# Patient Record
Sex: Female | Born: 1970 | Race: White | Hispanic: No | Marital: Married | Smoking: Never smoker
Health system: Southern US, Community
[De-identification: ages and names within clinical notes are randomized; demographics above are authoritative.]

## PROBLEM LIST (undated history)

## (undated) DIAGNOSIS — T8859XA Other complications of anesthesia, initial encounter: Secondary | ICD-10-CM

## (undated) DIAGNOSIS — F419 Anxiety disorder, unspecified: Secondary | ICD-10-CM

## (undated) DIAGNOSIS — M1990 Unspecified osteoarthritis, unspecified site: Secondary | ICD-10-CM

## (undated) HISTORY — PX: SHOULDER ARTHROSCOPY: SHX128

## (undated) HISTORY — PX: ERCP: SHX60

## (undated) HISTORY — PX: TUBAL LIGATION: SHX77

---

## 2005-06-26 ENCOUNTER — Ambulatory Visit (HOSPITAL_COMMUNITY): Admission: RE | Admit: 2005-06-26 | Discharge: 2005-06-26 | Payer: Self-pay | Admitting: Obstetrics and Gynecology

## 2006-06-07 ENCOUNTER — Inpatient Hospital Stay (HOSPITAL_COMMUNITY): Admission: RE | Admit: 2006-06-07 | Discharge: 2006-06-10 | Payer: Self-pay | Admitting: Obstetrics and Gynecology

## 2008-03-29 ENCOUNTER — Inpatient Hospital Stay (HOSPITAL_COMMUNITY): Admission: RE | Admit: 2008-03-29 | Discharge: 2008-03-31 | Payer: Self-pay | Admitting: Obstetrics and Gynecology

## 2008-03-29 ENCOUNTER — Encounter (INDEPENDENT_AMBULATORY_CARE_PROVIDER_SITE_OTHER): Payer: Self-pay | Admitting: Obstetrics and Gynecology

## 2010-05-05 ENCOUNTER — Encounter
Admission: RE | Admit: 2010-05-05 | Discharge: 2010-05-05 | Payer: Self-pay | Source: Home / Self Care | Admitting: Obstetrics and Gynecology

## 2010-06-25 ENCOUNTER — Other Ambulatory Visit
Admission: RE | Admit: 2010-06-25 | Discharge: 2010-06-25 | Payer: Self-pay | Source: Home / Self Care | Admitting: Family Medicine

## 2010-12-16 NOTE — H&P (Signed)
Kimberly Kelly, Kimberly Kelly                 ACCOUNT NO.:  [id number]   MEDICAL RECORD NO.:  [id number]          PATIENT TYPE:  INP   LOCATION:  9101                          [facility]ILITY:  [facility]   PHYSICIAN:  Lenoard Aden, M.D.DATE OF BIRTH:  1971/04/17   DATE OF ADMISSION:  03/29/2008  DATE OF DISCHARGE:                              HISTORY & PHYSICAL   CHIEF COMPLAINT:  Elective repeat C-section at 39 weeks.   She is a 40 year old white female G3, P1 who presents for a repeat C-  section at 39 weeks.  She is a nonsmoker, nondrinker.  She denies  domestic or physical violence.  Previous C-section for active phase  arrest.  The patient desires repeat and tubal ligation.   ALLERGIES:  No known drug allergies.   FAMILY HISTORY:  Hypertension, lung cancer, MI, congestive heart  failure, leukemia, breast cancer, and Down syndrome.   PHYSICAL EXAMINATION:  GENERAL:  She is a well-developed, well-  nourished, white female in no acute distress.  HEENT:  Normal.  LUNGS:  Clear.  HEART:  Regular rate and rhythm.  ABDOMEN:  Soft, gravid, and nontender.  Estimated fetal weight is 8-1/2  to 9 pounds.  Cervix is closed, 70% effaced, -2.  EXTREMITIES:  There are no cords.  NEUROLOGIC:  Nonfocal.  SKIN:  Intact.   IMPRESSION:  A 39-week intrauterine pregnancy for repeat C-section and  tubal ligation.  Risks and benefits discussed.  Risks of anesthesia,  infection, bleeding, and injury to abdominal organs with need for repair  noted.  The patient acknowledges and wishes to proceed.      Lenoard Aden, M.D.  Electronically Signed     RJT/MEDQ  D:  03/29/2008  T:  03/30/2008  Job:  [id number]

## 2010-12-16 NOTE — Op Note (Signed)
Kimberly Kelly, Kimberly Kelly                 ACCOUNT NO.:  [id number]   MEDICAL RECORD NO.:  [id number]          PATIENT TYPE:  INP   LOCATION:  9101                          [facility]   PHYSICIAN:  Lenoard Aden, M.D.DATE OF BIRTH:  Mar 19, 1971   DATE OF PROCEDURE:  03/29/2008  DATE OF DISCHARGE:                               OPERATIVE REPORT   PREOPERATIVE DIAGNOSES:  1. A 39-week intrauterine pregnancy.  2. Previous cesarean section, repeat.  3. Desire for elective sterilization.   POSTOPERATIVE DIAGNOSES:  1. A 39-week intrauterine pregnancy.  2. Previous cesarean section, repeat.  3. Desire for elective sterilization.   PROCEDURE:  Repeat cesarean section and tubal ligation.   SURGEON:  Lenoard Aden, MD   ANESTHESIA:  Spinal by Malen Gauze.   ESTIMATED BLOOD LOSS:  1000 mL.   COMPLICATIONS:  None.   DRAINS:  Foley.   COUNTS:  Correct.   CONDITION:  The patient recovered in good condition.   FINDINGS:  Normal tubes, normal ovary, normal uterus, full-term living  female fetus, occiput anterior position, Apgars 8 and 9, pediatricians in  attendance, and cord blood collection done.   BRIEF OPERATIVE NOTE:  After being apprised of risks of anesthesia,  infection, bleeding, injury to abdominal organs, need for repair, the  labor's immediate complications to include bowel and bladder injury,  failure risk of tubal ligation by 10:1000.  The patient was brought to  the operating room where she was administered a spinal anesthetic  without complications, prepped and draped in a sterile fashion.  Foley  catheter was placed after achieving adequate anesthesia, dilute Marcaine  solution was placed.  A Pfannenstiel skin incision was made with  scalpel, carried down to fascia, was nicked in the midline, opened  transversely with Mayo scissors.  Rectus muscles dissected sharply and  midline peritoneum entered sharply.  Bladder blade was placed.  Visceral  peritoneum scored sharply  off the lower segment.  Kerr hysterotomy  incision was made.  Atraumatic delivery of full-term living female after  amniotomy, clear fluid, handed to pediatricians in attendance, Apgars 8  and 9, and cord blood collected by cord blood collection team.  Placenta  delivered manually intact, three-vessel cord.  Uterus exteriorized and  curetted using a dry lap pack and closed in 2 running imbricating layers  of a 0 Monocryl suture.  At this time, right tube was traced out to  fimbriated end and ampullary isthmic portion tube was identified.  Avascular portion of the mesosalpinx was cauterized creating a window.  Proximal plain ties were placed proximally and distally, and a portion  of the right tube was excised.  Lumens were visualized and cauterized.  The same procedure was done on the right tube, it was also done on the  left tube, and tubal segments were sent to pathology.  At this time,  good hemostasis was achieved.  Uterus was replaced in the abdominal  cavity.  Irrigation was performed.  At this time, bladder flap was  inspected and found to be hemostatic.  The lower muscular fascial  attachments were reapproximated using  a 2-0 chromic fascia, closed using  a 0 Monocryl in a continuous running fashion.  Subcutaneous tissue  reapproximated using a 0 plain.  Skin was closed using the INSORB  absorbable stapler device in a standard fashion.  Steri-Strips were  placed.  The patient tolerated the procedure well and was transferred to  recovery room in good condition.      Lenoard Aden, M.D.  Electronically Signed     RJT/MEDQ  D:  03/29/2008  T:  03/30/2008  Job:  [id number]

## 2010-12-19 NOTE — Op Note (Signed)
NAMEBREAHNA, BOYLEN                 ACCOUNT NO.:  [id number]   MEDICAL RECORD NO.:  [id number]          PATIENT TYPE:  INP   LOCATION:  9122                          [facility]   PHYSICIAN:  Lenoard Aden, M.D.DATE OF BIRTH:  1971/07/27   DATE OF PROCEDURE:  06/07/2006  DATE OF DISCHARGE:                                 OPERATIVE REPORT   PREOPERATIVE DIAGNOSIS:  Active phase arrest, maternal fever.  Active phase  arrest 41 weeks.   POSTOPERATIVE DIAGNOSIS:  Active phase arrest, maternal fever.  Active phase  arrest 41 weeks.  OP.  Nuchal cord.  True knot in the cord.   OPERATION PERFORMED:  Primary low segment transverse cesarean section.   SURGEON:  Lenoard Aden, M.D.   ASSISTANT:  Chester Holstein. Earlene Plater, M.D.   ESTIMATED BLOOD LOSS:  1000 mL.   ANESTHESIA:   COMPLICATIONS:  None.   COUNTS:  Correct.   Patient to recovery in good condition.   FINDINGS:  Full term living female, Apgars 8 and 9, occiput posterior  position.  Placenta remained intact.  Three vessel cord noted.   INDICATIONS FOR PROCEDURE:   DESCRIPTION OF PROCEDURE:  Being apprised of the risks of anesthesia,  infection, bleeding, injury to abdominal organs, need for repair delayed  versus immediate complications including bowel and bladder injury, patient  brought to the operating room where she was administered dosing of epidural  anesthetic without complication, prepped and draped in the usual sterile  fashion, Foley catheter previously placed.  After achieving adequate  anesthesia, Pfannenstiel skin incision made with a scalpel, carried down to  the fascia which was nicked in the midline transverse using Mayo scissors.  Rectus muscles dissected sharply in the midline.  Peritoneum entered  sharply, bladder blade placed, visceral peritoneum scored in a smiley  fashion, dissected sharply off the lower uterine segment.  Curved  hysterotomy incision made.  Atraumatic delivery from an occiput  posterior  position.  Nuchal cord x1 reduced.  Full term living female.  Apgars 8 and  9.  Peds in attendance.  Cord blood collected.  True knot noted in the cord.  Placenta was manually intact.  Three vessel cord noted.  Uterus  exteriorized.  Curetted using a dry lap pack and closed in two running  imbricating layers of 0 Monocryl suture.  Irrigation was accomplished.  Bladder was  inspected and found to be hemostatic.  Normal tubes, normal ovaries noted.  Irrigation once again accomplished.  Bladder flap was inspected.  Fascia  closed using 0 Monocryl suture in continuous running fashion, skin closed  with skin staples.  The patient tolerated the procedure well, transferred to  recovery in good condition.      Lenoard Aden, M.D.  Electronically Signed     RJT/MEDQ  D:  06/07/2006  T:  06/08/2006  Job:  [id number]

## 2010-12-19 NOTE — H&P (Signed)
NAMELAKEIA, Kimberly Kelly                 ACCOUNT NO.:  [id number]   MEDICAL RECORD NO.:  [id number]          PATIENT TYPE:  INP   LOCATION:  9168                          [facility]   PHYSICIAN:  Lenoard Aden, M.D.DATE OF BIRTH:  [birth year]   DATE OF ADMISSION:  06/07/2006  DATE OF DISCHARGE:                                HISTORY & PHYSICAL   CHIEF COMPLAINT:  She is a 40 year old white female, G1, P0 at [redacted] weeks  gestation for induction.   ALLERGIES:  She has no known drug allergies.   MEDICATIONS:  Prenatal vitamins.   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   HABITS:  She is a nonsmoker, nondrinker. She denies domestic or physical  violence.   FAMILY HISTORY:  She has a family history of diabetes, myocardial  infarction, CHF, hypertension, lung cancer, leukemia, breast cancer, Down's  syndrome, and retinitis pigmentosa. Her husband is congenitally blind.   OBSTETRICS/GYNECOLOGY:  She has had a history of one spontaneous abortion.  Pregnancy course has been complicated by size date discrepancy with  appropriate for gestational age fetus noted on ultrasound. Otherwise,  uncomplicated prenatal course.   PHYSICAL EXAMINATION:  GENERAL:  She is a well-developed, well-nourished  white female in no acute distress.  HEENT:  Normal.  LUNGS:  Clear.  HEART:  Regular rhythm.  ABDOMEN:  Soft, gravida, nontender.  GENITOPELVIC:  Cervix is 2-3 cm, 60% vertex, -1.  EXTREMITIES:  Reveal __________ .  NEUROLOGIC:  Nonfocal.   IMPRESSION:  Post date intrauterine pregnancy for induction.   PLAN:  To proceed with induction. Risks and benefits of induction versus  expected management discussed. The patient acknowledges and wishes to  proceed.     Lenoard Aden, M.D.  Electronically Signed    RJT/MEDQ  D:  06/07/2006  T:  06/07/2006  Job:  [id number]

## 2010-12-19 NOTE — Discharge Summary (Signed)
Kimberly Kelly, Kimberly Kelly                 ACCOUNT NO.:  [id number]   MEDICAL RECORD NO.:  [id number]          PATIENT TYPE:  INP   LOCATION:  9122                          [facility]   PHYSICIAN:  Lenoard Aden, M.D.DATE OF BIRTH:  05-13-1971   DATE OF ADMISSION:  06/07/2006  DATE OF DISCHARGE:  06/10/2006                               DISCHARGE SUMMARY   Admitted for labor and had a C-section for active phase arrest.  Postoperative course uncomplicated.  Tolerated a regular diet well.  Ambulated without difficulty.  Discharged home day #3.  Discharge  teaching done.   DISCHARGE MEDICATIONS:  1. Prenatal vitamins.  2. Iron.   Followup in the office in 4-6 weeks.      Lenoard Aden, M.D.  Electronically Signed     RJT/MEDQ  D:  07/01/2006  T:  07/01/2006  Job:  [id number]

## 2010-12-19 NOTE — Discharge Summary (Signed)
Kimberly Kelly, Kimberly Kelly                 ACCOUNT NO.:  [id number]   MEDICAL RECORD NO.:  [id number]          PATIENT TYPE:  INP   LOCATION:  9101                          [facility]   PHYSICIAN:  Lenoard Aden, M.D.DATE OF BIRTH:  March 09, 1971   DATE OF ADMISSION:  03/29/2008  DATE OF DISCHARGE:  03/31/2008                               DISCHARGE SUMMARY   The patient underwent uncomplicated repeat C-section and tubal ligation.  Her postoperative course was uncomplicated.  Tolerated diet well.  Discharged to home on hospital day #3.  Discharge teaching done.   DISCHARGE MEDICATIONS:  Tylox, prenatal vitamins, and iron.   Follow up in the office in 4-6 weeks.      Lenoard Aden, M.D.  Electronically Signed     RJT/MEDQ  D:  04/25/2008  T:  04/26/2008  Job:  [id number]

## 2011-04-28 ENCOUNTER — Other Ambulatory Visit: Payer: Self-pay | Admitting: Obstetrics and Gynecology

## 2011-04-28 DIAGNOSIS — Z1231 Encounter for screening mammogram for malignant neoplasm of breast: Secondary | ICD-10-CM

## 2011-05-29 ENCOUNTER — Ambulatory Visit
Admission: RE | Admit: 2011-05-29 | Discharge: 2011-05-29 | Disposition: A | Discharge status: Home / Self Care | Payer: BC Managed Care – PPO | Source: Ambulatory Visit | Attending: Obstetrics and Gynecology | Admitting: Obstetrics and Gynecology

## 2011-05-29 DIAGNOSIS — Z1231 Encounter for screening mammogram for malignant neoplasm of breast: Secondary | ICD-10-CM

## 2012-06-15 ENCOUNTER — Other Ambulatory Visit: Payer: Self-pay | Admitting: Obstetrics and Gynecology

## 2012-06-15 DIAGNOSIS — Z1231 Encounter for screening mammogram for malignant neoplasm of breast: Secondary | ICD-10-CM

## 2012-08-11 ENCOUNTER — Ambulatory Visit
Admission: RE | Admit: 2012-08-11 | Discharge: 2012-08-11 | Disposition: A | Discharge status: Home / Self Care | Payer: BC Managed Care – PPO | Source: Ambulatory Visit | Attending: Obstetrics and Gynecology | Admitting: Obstetrics and Gynecology

## 2012-08-11 DIAGNOSIS — Z1231 Encounter for screening mammogram for malignant neoplasm of breast: Secondary | ICD-10-CM

## 2012-09-22 ENCOUNTER — Encounter (HOSPITAL_COMMUNITY): Payer: Self-pay | Admitting: Cardiology

## 2012-09-22 ENCOUNTER — Emergency Department (HOSPITAL_COMMUNITY)
Admission: EM | Admit: 2012-09-22 | Discharge: 2012-09-22 | Disposition: A | Discharge status: Home / Self Care | Payer: BC Managed Care – PPO | Attending: Emergency Medicine | Admitting: Emergency Medicine

## 2012-09-22 DIAGNOSIS — K529 Noninfective gastroenteritis and colitis, unspecified: Secondary | ICD-10-CM

## 2012-09-22 DIAGNOSIS — Z3202 Encounter for pregnancy test, result negative: Secondary | ICD-10-CM | POA: Insufficient documentation

## 2012-09-22 DIAGNOSIS — K5289 Other specified noninfective gastroenteritis and colitis: Secondary | ICD-10-CM | POA: Insufficient documentation

## 2012-09-22 DIAGNOSIS — R197 Diarrhea, unspecified: Secondary | ICD-10-CM | POA: Insufficient documentation

## 2012-09-22 LAB — URINALYSIS, ROUTINE W REFLEX MICROSCOPIC
Glucose, UA: NEGATIVE mg/dL
Ketones, ur: 15 mg/dL — AB
Nitrite: NEGATIVE
Protein, ur: NEGATIVE mg/dL
Specific Gravity, Urine: 1.034 — ABNORMAL HIGH (ref 1.005–1.030)
pH: 5 (ref 5.0–8.0)

## 2012-09-22 LAB — BASIC METABOLIC PANEL
BUN: 16 mg/dL (ref 6–23)
CO2: 25 mEq/L (ref 19–32)
Calcium: 9.8 mg/dL (ref 8.4–10.5)
Chloride: 104 mEq/L (ref 96–112)
Creatinine, Ser: 0.93 mg/dL (ref 0.50–1.10)
GFR calc Af Amer: 87 mL/min — ABNORMAL LOW (ref >90)
GFR calc non Af Amer: 75 mL/min — ABNORMAL LOW (ref >90)
Glucose, Bld: 103 mg/dL — ABNORMAL HIGH (ref 70–99)
Potassium: 4.3 mEq/L (ref 3.5–5.1)
Sodium: 141 mEq/L (ref 135–145)

## 2012-09-22 LAB — URINE MICROSCOPIC-ADD ON

## 2012-09-22 LAB — CBC
Hemoglobin: 16.8 g/dL — ABNORMAL HIGH (ref 12.0–15.0)
MCH: 30.2 pg (ref 26.0–34.0)
MCHC: 34.7 g/dL (ref 30.0–36.0)
MCV: 86.9 fL (ref 78.0–100.0)
RBC: 5.57 MIL/uL — ABNORMAL HIGH (ref 3.87–5.11)

## 2012-09-22 LAB — POCT PREGNANCY, URINE: Preg Test, Ur: NEGATIVE

## 2012-09-22 MED ORDER — LOPERAMIDE HCL 2 MG PO CAPS
4.0000 mg | ORAL_CAPSULE | Freq: Once | ORAL | Status: AC
  Administered 2012-09-22: 4 mg via ORAL
  Filled 2012-09-22: qty 2

## 2012-09-22 MED ORDER — SODIUM CHLORIDE 0.9 % IV BOLUS (SEPSIS)
2000.0000 mL | Freq: Once | INTRAVENOUS | Status: AC
  Administered 2012-09-22: 2000 mL via INTRAVENOUS

## 2012-09-22 MED ORDER — ONDANSETRON HCL 4 MG PO TABS: 4.0000 mg | ORAL_TABLET | Freq: Three times a day (TID) | ORAL | Status: DC | PRN

## 2012-09-22 MED ORDER — ONDANSETRON HCL 4 MG/2ML IJ SOLN
4.0000 mg | Freq: Once | INTRAMUSCULAR | Status: AC
  Administered 2012-09-22: 4 mg via INTRAVENOUS
  Filled 2012-09-22: qty 2

## 2012-09-22 NOTE — ED Notes (Signed)
Pt reports n/v/d since Monday night. Reports she vomited all Monday night and then started having diarrhea. Reports generalized abd pain and some dizziness.

## 2012-09-22 NOTE — ED Provider Notes (Signed)
History     CSN: [id number]  Arrival date & time 09/22/12  1224   First MD Initiated Contact with Patient 09/22/12 1459      Chief Complaint  Patient presents with  . Nausea  . Emesis  . Diarrhea    (Consider location/radiation/quality/duration/timing/severity/associated sxs/prior treatment) Patient is a 42 y.o. female presenting with vomiting and diarrhea.  Emesis Associated symptoms: diarrhea   Diarrhea Associated symptoms: vomiting    Pt is otherwise healthy reports multiple family members with similar symptoms. She had vomiting and diarrhea 3 days ago which improved initially but returned last night. No blood in vomit or diarrhea. She has had occasional cramping prior to diarrhea, but otherwise no pain. No fever. Feels weak and jittery.  History reviewed. No pertinent past medical history.  History reviewed. No pertinent past surgical history.  History reviewed. No pertinent family history.  History  Substance Use Topics  . Smoking status: Never Smoker   . Smokeless tobacco: Not on file  . Alcohol Use: Yes    OB History   Grav Para Term Preterm Abortions TAB SAB Ect Mult Living                  Review of Systems  Gastrointestinal: Positive for vomiting and diarrhea.  All other systems reviewed and are negative except as noted in HPI.    Allergies  Review of patient's allergies indicates no known allergies.  Home Medications  No current outpatient prescriptions on file.  BP 129/89  Pulse 127  SpO2 100%  Physical Exam  Nursing note and vitals reviewed. Constitutional: She is oriented to person, place, and time. She appears well-developed and well-nourished.  HENT:  Head: Normocephalic and atraumatic.  Dry mouth  Eyes: EOM are normal. Pupils are equal, round, and reactive to light.  Neck: Normal range of motion. Neck supple.  Cardiovascular: Normal rate, normal heart sounds and intact distal pulses.   Pulmonary/Chest: Effort normal and breath  sounds normal.  Abdominal: Bowel sounds are normal. She exhibits no distension. There is no tenderness.  Musculoskeletal: Normal range of motion. She exhibits no edema and no tenderness.  Neurological: She is alert and oriented to person, place, and time. She has normal strength. No cranial nerve deficit or sensory deficit.  Skin: Skin is warm and dry. No rash noted.  Psychiatric: She has a normal mood and affect.    ED Course  Procedures (including critical care time)  Labs Reviewed  CBC - Abnormal; Notable for the following:    RBC 5.57 (*)    Hemoglobin 16.8 (*)    HCT 48.4 (*)    All other components within normal limits  BASIC METABOLIC PANEL - Abnormal; Notable for the following:    Glucose, Bld 103 (*)    GFR calc non Af Amer 75 (*)    GFR calc Af Amer 87 (*)    All other components within normal limits  URINALYSIS, ROUTINE W REFLEX MICROSCOPIC - Abnormal; Notable for the following:    Color, Urine AMBER (*)    APPearance TURBID (*)    Specific Gravity, Urine 1.034 (*)    Hgb urine dipstick SMALL (*)    Bilirubin Urine SMALL (*)    Ketones, ur 15 (*)    Leukocytes, UA SMALL (*)    All other components within normal limits  URINE MICROSCOPIC-ADD ON - Abnormal; Notable for the following:    Squamous Epithelial / LPF FEW (*)    All other components within normal  limits  CLOSTRIDIUM DIFFICILE BY PCR  POCT PREGNANCY, URINE   No results found.   No diagnosis found.    MDM  Bloodwork ordered in triage unremarkable except for probable hemoconcentration. Will have nurse place IV for fluid bolus, zofran and reassess after hydration.  5:53 PM Labs unremarkable. Pt still having diarrhea, but no further vomiting. Tolerating PO. Will check c-diff but this is unlikely, no risk factors, no recent Abx use. Plan for D/C home.       Sulo Janczak B. Bernette Mayers, MD 09/22/12 1754

## 2012-09-22 NOTE — ED Notes (Signed)
Pt ambulated to restroom, had episode of diarrhea, unable to obtain urine specimen due to diarrhea.

## 2012-09-23 LAB — CLOSTRIDIUM DIFFICILE BY PCR: Toxigenic C. Difficile by PCR: NEGATIVE

## 2013-06-08 ENCOUNTER — Other Ambulatory Visit: Payer: Self-pay

## 2013-08-15 ENCOUNTER — Other Ambulatory Visit: Payer: Self-pay

## 2013-08-15 DIAGNOSIS — Z1231 Encounter for screening mammogram for malignant neoplasm of breast: Secondary | ICD-10-CM

## 2013-09-06 ENCOUNTER — Ambulatory Visit
Admission: RE | Admit: 2013-09-06 | Discharge: 2013-09-06 | Disposition: A | Discharge status: Home / Self Care | Payer: BC Managed Care – PPO | Source: Ambulatory Visit

## 2013-09-06 DIAGNOSIS — Z1231 Encounter for screening mammogram for malignant neoplasm of breast: Secondary | ICD-10-CM

## 2014-10-10 ENCOUNTER — Other Ambulatory Visit: Payer: Self-pay

## 2014-10-10 DIAGNOSIS — Z1231 Encounter for screening mammogram for malignant neoplasm of breast: Secondary | ICD-10-CM

## 2014-10-11 ENCOUNTER — Other Ambulatory Visit: Payer: Self-pay | Admitting: Obstetrics and Gynecology

## 2014-10-11 ENCOUNTER — Ambulatory Visit
Admission: RE | Admit: 2014-10-11 | Discharge: 2014-10-11 | Disposition: A | Discharge status: Home / Self Care | Payer: BLUE CROSS/BLUE SHIELD | Source: Ambulatory Visit

## 2014-10-11 DIAGNOSIS — R928 Other abnormal and inconclusive findings on diagnostic imaging of breast: Secondary | ICD-10-CM

## 2014-10-11 DIAGNOSIS — Z1231 Encounter for screening mammogram for malignant neoplasm of breast: Secondary | ICD-10-CM

## 2014-10-16 ENCOUNTER — Ambulatory Visit
Admission: RE | Admit: 2014-10-16 | Discharge: 2014-10-16 | Disposition: A | Discharge status: Home / Self Care | Payer: BLUE CROSS/BLUE SHIELD | Source: Ambulatory Visit | Attending: Obstetrics and Gynecology | Admitting: Obstetrics and Gynecology

## 2014-10-16 DIAGNOSIS — R928 Other abnormal and inconclusive findings on diagnostic imaging of breast: Secondary | ICD-10-CM

## 2015-04-23 ENCOUNTER — Emergency Department (HOSPITAL_COMMUNITY)
Admission: EM | Admit: 2015-04-23 | Discharge: 2015-04-23 | Disposition: A | Discharge status: Home / Self Care | Payer: BLUE CROSS/BLUE SHIELD | Attending: Emergency Medicine | Admitting: Emergency Medicine

## 2015-04-23 ENCOUNTER — Encounter (HOSPITAL_COMMUNITY): Payer: Self-pay

## 2015-04-23 DIAGNOSIS — M1990 Unspecified osteoarthritis, unspecified site: Secondary | ICD-10-CM | POA: Diagnosis not present

## 2015-04-23 DIAGNOSIS — R21 Rash and other nonspecific skin eruption: Secondary | ICD-10-CM

## 2015-04-23 DIAGNOSIS — Z791 Long term (current) use of non-steroidal anti-inflammatories (NSAID): Secondary | ICD-10-CM | POA: Diagnosis not present

## 2015-04-23 HISTORY — DX: Unspecified osteoarthritis, unspecified site: M19.90

## 2015-04-23 LAB — CBC WITH DIFFERENTIAL/PLATELET
Basophils Absolute: 0 10*3/uL (ref 0.0–0.1)
Basophils Relative: 1 %
EOS PCT: 3 %
Eosinophils Absolute: 0.2 10*3/uL (ref 0.0–0.7)
HCT: 40.1 % (ref 36.0–46.0)
Hemoglobin: 14 g/dL (ref 12.0–15.0)
Lymphocytes Relative: 13 %
Lymphs Abs: 0.8 10*3/uL (ref 0.7–4.0)
MCH: 29.7 pg (ref 26.0–34.0)
MCHC: 34.9 g/dL (ref 30.0–36.0)
MCV: 85.1 fL (ref 78.0–100.0)
Monocytes Absolute: 0.3 10*3/uL (ref 0.1–1.0)
Monocytes Relative: 4 %
Neutro Abs: 4.9 10*3/uL (ref 1.7–7.7)
Neutrophils Relative %: 79 %
Platelets: 283 10*3/uL (ref 150–400)
RBC: 4.71 MIL/uL (ref 3.87–5.11)
RDW: 12.6 % (ref 11.5–15.5)
WBC: 6.1 10*3/uL (ref 4.0–10.5)

## 2015-04-23 MED ORDER — PREDNISONE 20 MG PO TABS
60.0000 mg | ORAL_TABLET | Freq: Once | ORAL | Status: AC
  Administered 2015-04-23: 60 mg via ORAL
  Filled 2015-04-23: qty 3

## 2015-04-23 MED ORDER — PREDNISONE 10 MG (21) PO TBPK: 10.0000 mg | ORAL_TABLET | Freq: Every day | ORAL | Status: DC

## 2015-04-23 MED ORDER — FAMOTIDINE 20 MG PO TABS
20.0000 mg | ORAL_TABLET | Freq: Once | ORAL | Status: AC
  Administered 2015-04-23: 20 mg via ORAL
  Filled 2015-04-23: qty 1

## 2015-04-23 NOTE — Discharge Instructions (Signed)

## 2015-04-23 NOTE — ED Provider Notes (Signed)
CSN: [id number]     Arrival date & time 04/23/15  6789 History   None    Chief Complaint  Patient presents with  . Rash     (Consider location/radiation/quality/duration/timing/severity/associated sxs/prior Treatment) HPI   Patient is a 44 year old female with history of arthritis, otherwise healthy, she reports to the emergency department with 3 days of rash all over her body which first developed bilaterally of her upper extremities. She states that the rash is not really itchy but is uncomfortable associated with heat worse when she takes a warm shower. She was seen yesterday by her PCP had a rapid strep test which was negative and was given a sterile shot. She states she has been taking ibuprofen 200 mg every 4-6 hours and also 50 mg of Benadryl every 4-6 hours. She states she has seen no improvement in her rash with any of these interventions. The Benadryl will temporarily take away some of the heat and discomfort associated with the rash but it has not decreased at all. This morning she woke up with pain in her hands and ankles. She states that her hands feel tight. And her right ankle hurts more than her left ankle. She states that she has a significant amount of arthritis pain and this was abnormally increased from her baseline so she wanted to come for further evaluation in the ER. She has been evaluated by rheumatologist before and tested negative for RA.   She denies having any new soaps or detergents, she has not discontinued or began any new medications. She has a history of seasonal allergies however was many years ago when she lived in the [place] part of [place] and she has not had any issues sensitivity to [place] a few years ago.  One week ago she had pictures taken of her daughter and they went to a large field without the foliage, she did not have any obvious allergic reactions at that time no itchy watery eyes no sneezing no rash where she contacted plans on her legs.  She states she was scratched by her cat on Saturday morning which was 3 days ago. She does not have any signs of infection on her left palm where she was scratched, she has not had any fever, she is not any redness or drainage from her scratch area. She denies any nausea, vomiting, shortness of breath, wheeze, throat tightening, difficulty with secretions or swallowing, change to quality of her voice. She only complains of feeling some generalized weakness and fatigue.  Past Medical History  Diagnosis Date  . Arthritis    Past Surgical History  Procedure Laterality Date  . Cesarean section    . Tubal ligation    . Shoulder arthroscopy     No family history on file. Social History  Substance Use Topics  . Smoking status: Never Smoker   . Smokeless tobacco: None  . Alcohol Use: 4.2 oz/week    7 Glasses of wine per week   OB History    No data available     Review of Systems  Constitutional: Negative.   HENT: Positive for congestion.   Respiratory: Negative.   Cardiovascular: Negative.   Gastrointestinal: Negative.   Genitourinary: Negative.   Musculoskeletal: Positive for joint swelling and arthralgias.  Skin: Positive for rash.  Allergic/Immunologic: Positive for environmental allergies.  Neurological: Negative.   Psychiatric/Behavioral: Negative.       Allergies  Review of patient's allergies indicates no known allergies.  Home Medications  Prior to Admission medications   Medication Sig Start Date End Date Taking? Authorizing Provider  ibuprofen (ADVIL,MOTRIN) 200 MG tablet Take 200 mg by mouth every 6 (six) hours as needed for mild pain.   Yes Historical Provider, MD  naproxen (NAPROSYN) 250 MG tablet Take 250 mg by mouth 2 (two) times daily with a meal. pain   Yes Historical Provider, MD  predniSONE (STERAPRED UNI-PAK 21 TAB) 10 MG (21) TBPK tablet Take 1 tablet (10 mg total) by mouth daily. Take 6 tabs by mouth daily  for 2 days, then 5 tabs for 2 days, then 4  tabs for 2 days, then 3 tabs for 2 days, 2 tabs for 2 days, then 1 tab by mouth daily for 2 days 04/23/15   Delsa Grana, PA-C   BP 125/84 mmHg  Pulse 83  Temp(Src) 98.2 F (36.8 C) (Oral)  Resp 16  SpO2 99%  LMP 04/05/2015 Physical Exam  Constitutional: She is oriented to person, place, and time. She appears well-developed and well-nourished. No distress.  HENT:  Head: Normocephalic and atraumatic.  Nose: Nose normal.  Mouth/Throat: Oropharynx is clear and moist. No oropharyngeal exudate.  Eyes: Conjunctivae and EOM are normal. Pupils are equal, round, and reactive to light. Right eye exhibits no discharge. Left eye exhibits no discharge. No scleral icterus.  Neck: Normal range of motion. No JVD present. No tracheal deviation present. No thyromegaly present.  Cardiovascular: Normal rate, regular rhythm, normal heart sounds and intact distal pulses.  Exam reveals no gallop and no friction rub.   No murmur heard. Pulmonary/Chest: Effort normal and breath sounds normal. No respiratory distress. She has no wheezes. She has no rales. She exhibits no tenderness.  Abdominal: Soft. Bowel sounds are normal. She exhibits no distension and no mass. There is no tenderness. There is no rebound and no guarding.  Musculoskeletal: Normal range of motion. She exhibits no edema or tenderness.  Lymphadenopathy:    She has no cervical adenopathy.  Neurological: She is alert and oriented to person, place, and time. She has normal reflexes. No cranial nerve deficit. She exhibits normal muscle tone. Coordination normal.  Skin: Skin is warm and dry. Rash noted. She is not diaphoretic. There is erythema. No pallor.  Confluent erythematous macules to bilateral arms   Psychiatric: She has a normal mood and affect. Her behavior is normal. Judgment and thought content normal.  Nursing note and vitals reviewed.      ED Course  Procedures (including critical care time) Labs Review Labs Reviewed  CBC WITH  DIFFERENTIAL/PLATELET    Imaging Review No results found. I have personally reviewed and evaluated these images and lab results as part of my medical decision-making.   EKG Interpretation None      MDM   Final diagnoses:  Rash    Patient with diffuse rash without known etiology She was seen yesterday by her PCP and received a sterile shot and has not seen any improvement but conversely felt that she worsened when she woke up this morning with more pain and persistent unchanged rash. Will get a CBC with differential, will give Pepcid and prednisone orally, she took Benadryl prior to coming to the ER.  I anticipate that she will need a long steroidal taper and will be advised to follow-up with her PCP.  Correct dosing for benadryl was reviewed with the pt.  No abnormality of CBC.  She was d/c home with longer steroid taper, urged f/up with PCP.  No respiratory distress  or compromise.    Delsa Grana, PA-C 05/01/15 [id number]  Varney Biles, MD 05/06/15 [phone]

## 2015-04-23 NOTE — ED Notes (Signed)
Pt. States she has had diffuse body rash x 3 days. Pt. Seen by PCP yesterday and given steroid injection. Pt. Taking ibuprofen and benadryl at home. Pt. States she has seen some improvement with redness but feels like rash is spreading. Pt. States rash does not itch.

## 2015-10-23 ENCOUNTER — Other Ambulatory Visit: Payer: Self-pay

## 2015-10-23 DIAGNOSIS — Z1231 Encounter for screening mammogram for malignant neoplasm of breast: Secondary | ICD-10-CM

## 2015-11-21 ENCOUNTER — Ambulatory Visit
Admission: RE | Admit: 2015-11-21 | Discharge: 2015-11-21 | Disposition: A | Discharge status: Home / Self Care | Payer: BLUE CROSS/BLUE SHIELD | Source: Ambulatory Visit

## 2015-11-21 DIAGNOSIS — Z1231 Encounter for screening mammogram for malignant neoplasm of breast: Secondary | ICD-10-CM

## 2016-04-24 DIAGNOSIS — Z23 Encounter for immunization: Secondary | ICD-10-CM | POA: Diagnosis not present

## 2016-05-06 DIAGNOSIS — Z01419 Encounter for gynecological examination (general) (routine) without abnormal findings: Secondary | ICD-10-CM | POA: Diagnosis not present

## 2016-05-06 DIAGNOSIS — Z6830 Body mass index (BMI) 30.0-30.9, adult: Secondary | ICD-10-CM | POA: Diagnosis not present

## 2016-05-06 DIAGNOSIS — Z1151 Encounter for screening for human papillomavirus (HPV): Secondary | ICD-10-CM | POA: Diagnosis not present

## 2016-05-15 DIAGNOSIS — S83206A Unspecified tear of unspecified meniscus, current injury, right knee, initial encounter: Secondary | ICD-10-CM | POA: Diagnosis not present

## 2016-06-05 DIAGNOSIS — M25561 Pain in right knee: Secondary | ICD-10-CM | POA: Diagnosis not present

## 2016-06-10 DIAGNOSIS — M222X1 Patellofemoral disorders, right knee: Secondary | ICD-10-CM | POA: Diagnosis not present

## 2016-06-17 DIAGNOSIS — M2241 Chondromalacia patellae, right knee: Secondary | ICD-10-CM | POA: Diagnosis not present

## 2016-06-17 DIAGNOSIS — R531 Weakness: Secondary | ICD-10-CM | POA: Diagnosis not present

## 2016-06-17 DIAGNOSIS — M25561 Pain in right knee: Secondary | ICD-10-CM | POA: Diagnosis not present

## 2016-06-23 DIAGNOSIS — M2241 Chondromalacia patellae, right knee: Secondary | ICD-10-CM | POA: Diagnosis not present

## 2016-06-23 DIAGNOSIS — R531 Weakness: Secondary | ICD-10-CM | POA: Diagnosis not present

## 2016-06-23 DIAGNOSIS — M25561 Pain in right knee: Secondary | ICD-10-CM | POA: Diagnosis not present

## 2016-06-29 DIAGNOSIS — R531 Weakness: Secondary | ICD-10-CM | POA: Diagnosis not present

## 2016-06-29 DIAGNOSIS — M2241 Chondromalacia patellae, right knee: Secondary | ICD-10-CM | POA: Diagnosis not present

## 2016-06-29 DIAGNOSIS — M25561 Pain in right knee: Secondary | ICD-10-CM | POA: Diagnosis not present

## 2016-07-03 DIAGNOSIS — M2241 Chondromalacia patellae, right knee: Secondary | ICD-10-CM | POA: Diagnosis not present

## 2016-07-03 DIAGNOSIS — M25561 Pain in right knee: Secondary | ICD-10-CM | POA: Diagnosis not present

## 2016-07-03 DIAGNOSIS — R531 Weakness: Secondary | ICD-10-CM | POA: Diagnosis not present

## 2016-07-06 DIAGNOSIS — M2241 Chondromalacia patellae, right knee: Secondary | ICD-10-CM | POA: Diagnosis not present

## 2016-07-06 DIAGNOSIS — M25561 Pain in right knee: Secondary | ICD-10-CM | POA: Diagnosis not present

## 2016-07-06 DIAGNOSIS — R531 Weakness: Secondary | ICD-10-CM | POA: Diagnosis not present

## 2016-07-09 DIAGNOSIS — R531 Weakness: Secondary | ICD-10-CM | POA: Diagnosis not present

## 2016-07-09 DIAGNOSIS — M2241 Chondromalacia patellae, right knee: Secondary | ICD-10-CM | POA: Diagnosis not present

## 2016-07-09 DIAGNOSIS — M25561 Pain in right knee: Secondary | ICD-10-CM | POA: Diagnosis not present

## 2016-07-14 DIAGNOSIS — M25561 Pain in right knee: Secondary | ICD-10-CM | POA: Diagnosis not present

## 2016-07-14 DIAGNOSIS — M2241 Chondromalacia patellae, right knee: Secondary | ICD-10-CM | POA: Diagnosis not present

## 2016-07-14 DIAGNOSIS — R531 Weakness: Secondary | ICD-10-CM | POA: Diagnosis not present

## 2016-07-20 DIAGNOSIS — M2241 Chondromalacia patellae, right knee: Secondary | ICD-10-CM | POA: Diagnosis not present

## 2016-07-20 DIAGNOSIS — R531 Weakness: Secondary | ICD-10-CM | POA: Diagnosis not present

## 2016-07-20 DIAGNOSIS — M25561 Pain in right knee: Secondary | ICD-10-CM | POA: Diagnosis not present

## 2016-07-22 DIAGNOSIS — M222X1 Patellofemoral disorders, right knee: Secondary | ICD-10-CM | POA: Diagnosis not present

## 2016-08-05 DIAGNOSIS — M25561 Pain in right knee: Secondary | ICD-10-CM | POA: Diagnosis not present

## 2016-08-05 DIAGNOSIS — R531 Weakness: Secondary | ICD-10-CM | POA: Diagnosis not present

## 2016-08-05 DIAGNOSIS — M2241 Chondromalacia patellae, right knee: Secondary | ICD-10-CM | POA: Diagnosis not present

## 2016-08-13 DIAGNOSIS — Z79899 Other long term (current) drug therapy: Secondary | ICD-10-CM | POA: Diagnosis not present

## 2016-08-13 DIAGNOSIS — R531 Weakness: Secondary | ICD-10-CM | POA: Diagnosis not present

## 2016-08-13 DIAGNOSIS — M25561 Pain in right knee: Secondary | ICD-10-CM | POA: Diagnosis not present

## 2016-08-13 DIAGNOSIS — M150 Primary generalized (osteo)arthritis: Secondary | ICD-10-CM | POA: Diagnosis not present

## 2016-08-13 DIAGNOSIS — Z1589 Genetic susceptibility to other disease: Secondary | ICD-10-CM | POA: Diagnosis not present

## 2016-08-13 DIAGNOSIS — M7662 Achilles tendinitis, left leg: Secondary | ICD-10-CM | POA: Diagnosis not present

## 2016-08-13 DIAGNOSIS — M2241 Chondromalacia patellae, right knee: Secondary | ICD-10-CM | POA: Diagnosis not present

## 2016-08-17 DIAGNOSIS — R531 Weakness: Secondary | ICD-10-CM | POA: Diagnosis not present

## 2016-08-17 DIAGNOSIS — M25561 Pain in right knee: Secondary | ICD-10-CM | POA: Diagnosis not present

## 2016-08-17 DIAGNOSIS — M2241 Chondromalacia patellae, right knee: Secondary | ICD-10-CM | POA: Diagnosis not present

## 2016-08-24 DIAGNOSIS — R531 Weakness: Secondary | ICD-10-CM | POA: Diagnosis not present

## 2016-08-24 DIAGNOSIS — M25561 Pain in right knee: Secondary | ICD-10-CM | POA: Diagnosis not present

## 2016-08-24 DIAGNOSIS — M2241 Chondromalacia patellae, right knee: Secondary | ICD-10-CM | POA: Diagnosis not present

## 2016-08-31 DIAGNOSIS — M222X1 Patellofemoral disorders, right knee: Secondary | ICD-10-CM | POA: Diagnosis not present

## 2016-09-02 DIAGNOSIS — R531 Weakness: Secondary | ICD-10-CM | POA: Diagnosis not present

## 2016-09-02 DIAGNOSIS — M2241 Chondromalacia patellae, right knee: Secondary | ICD-10-CM | POA: Diagnosis not present

## 2016-09-02 DIAGNOSIS — M25561 Pain in right knee: Secondary | ICD-10-CM | POA: Diagnosis not present

## 2016-10-10 ENCOUNTER — Encounter (HOSPITAL_COMMUNITY): Payer: Self-pay

## 2016-10-10 ENCOUNTER — Emergency Department (HOSPITAL_COMMUNITY): Payer: BLUE CROSS/BLUE SHIELD

## 2016-10-10 ENCOUNTER — Encounter (HOSPITAL_COMMUNITY)
Admission: EM | Disposition: A | Discharge status: Home / Self Care | Payer: Self-pay | Source: Home / Self Care | Attending: Emergency Medicine

## 2016-10-10 ENCOUNTER — Observation Stay (HOSPITAL_COMMUNITY)
Admission: EM | Admit: 2016-10-10 | Discharge: 2016-10-11 | Disposition: A | Discharge status: Home / Self Care | Payer: BLUE CROSS/BLUE SHIELD | Attending: Surgery | Admitting: Surgery

## 2016-10-10 ENCOUNTER — Observation Stay (HOSPITAL_COMMUNITY): Payer: BLUE CROSS/BLUE SHIELD | Admitting: Certified Registered Nurse Anesthetist

## 2016-10-10 DIAGNOSIS — R1013 Epigastric pain: Secondary | ICD-10-CM

## 2016-10-10 DIAGNOSIS — M1990 Unspecified osteoarthritis, unspecified site: Secondary | ICD-10-CM | POA: Diagnosis not present

## 2016-10-10 DIAGNOSIS — K8010 Calculus of gallbladder with chronic cholecystitis without obstruction: Secondary | ICD-10-CM | POA: Diagnosis not present

## 2016-10-10 DIAGNOSIS — R079 Chest pain, unspecified: Secondary | ICD-10-CM | POA: Diagnosis not present

## 2016-10-10 DIAGNOSIS — R0789 Other chest pain: Secondary | ICD-10-CM | POA: Diagnosis not present

## 2016-10-10 DIAGNOSIS — K8020 Calculus of gallbladder without cholecystitis without obstruction: Secondary | ICD-10-CM | POA: Diagnosis present

## 2016-10-10 DIAGNOSIS — K8000 Calculus of gallbladder with acute cholecystitis without obstruction: Secondary | ICD-10-CM | POA: Diagnosis not present

## 2016-10-10 DIAGNOSIS — R1011 Right upper quadrant pain: Secondary | ICD-10-CM | POA: Diagnosis not present

## 2016-10-10 HISTORY — PX: CHOLECYSTECTOMY: SHX55

## 2016-10-10 LAB — COMPREHENSIVE METABOLIC PANEL
ALBUMIN: 4.1 g/dL (ref 3.5–5.0)
ALT: 30 U/L (ref 14–54)
ANION GAP: 11 (ref 5–15)
AST: 23 U/L (ref 15–41)
Alkaline Phosphatase: 55 U/L (ref 38–126)
BILIRUBIN TOTAL: 0.4 mg/dL (ref 0.3–1.2)
BUN: 13 mg/dL (ref 6–20)
CHLORIDE: 104 mmol/L (ref 101–111)
CO2: 24 mmol/L (ref 22–32)
Calcium: 9.4 mg/dL (ref 8.9–10.3)
Creatinine, Ser: 0.83 mg/dL (ref 0.44–1.00)
GFR calc Af Amer: >60 mL/min (ref >60)
GFR calc non Af Amer: >60 mL/min (ref >60)
GLUCOSE: 140 mg/dL — AB (ref 65–99)
POTASSIUM: 3.9 mmol/L (ref 3.5–5.1)
Sodium: 139 mmol/L (ref 135–145)
TOTAL PROTEIN: 6.9 g/dL (ref 6.5–8.1)

## 2016-10-10 LAB — CBC WITH DIFFERENTIAL/PLATELET
BASOS ABS: 0.1 10*3/uL (ref 0.0–0.1)
Basophils Relative: 1 %
Eosinophils Absolute: 0.1 10*3/uL (ref 0.0–0.7)
Eosinophils Relative: 1 %
HEMATOCRIT: 41.4 % (ref 36.0–46.0)
Hemoglobin: 13.9 g/dL (ref 12.0–15.0)
Lymphocytes Relative: 11 %
Lymphs Abs: 1.1 10*3/uL (ref 0.7–4.0)
MCH: 29.1 pg (ref 26.0–34.0)
MCHC: 33.6 g/dL (ref 30.0–36.0)
MCV: 86.6 fL (ref 78.0–100.0)
Monocytes Absolute: 0.3 10*3/uL (ref 0.1–1.0)
Monocytes Relative: 3 %
NEUTROS ABS: 8.5 10*3/uL — AB (ref 1.7–7.7)
Neutrophils Relative %: 84 %
Platelets: 390 10*3/uL (ref 150–400)
RBC: 4.78 MIL/uL (ref 3.87–5.11)
RDW: 13.4 % (ref 11.5–15.5)
WBC: 10.1 10*3/uL (ref 4.0–10.5)

## 2016-10-10 LAB — LIPASE, BLOOD: Lipase: 17 U/L (ref 11–51)

## 2016-10-10 LAB — I-STAT TROPONIN, ED: Troponin i, poc: 0 ng/mL (ref 0.00–0.08)

## 2016-10-10 SURGERY — LAPAROSCOPIC CHOLECYSTECTOMY WITH INTRAOPERATIVE CHOLANGIOGRAM
Anesthesia: General | Site: Abdomen

## 2016-10-10 MED ORDER — PROPOFOL 10 MG/ML IV BOLUS
INTRAVENOUS | Status: AC
  Filled 2016-10-10: qty 20

## 2016-10-10 MED ORDER — DIPHENHYDRAMINE HCL 50 MG/ML IJ SOLN: 25.0000 mg | Freq: Four times a day (QID) | INTRAMUSCULAR | Status: DC | PRN

## 2016-10-10 MED ORDER — ROCURONIUM BROMIDE 100 MG/10ML IV SOLN
INTRAVENOUS | Status: DC | PRN
  Administered 2016-10-10: 50 mg via INTRAVENOUS

## 2016-10-10 MED ORDER — IOPAMIDOL (ISOVUE-300) INJECTION 61%
INTRAVENOUS | Status: AC
  Filled 2016-10-10: qty 50

## 2016-10-10 MED ORDER — DIPHENHYDRAMINE HCL 25 MG PO CAPS: 25.0000 mg | ORAL_CAPSULE | Freq: Four times a day (QID) | ORAL | Status: DC | PRN

## 2016-10-10 MED ORDER — SUCCINYLCHOLINE CHLORIDE 20 MG/ML IJ SOLN
INTRAMUSCULAR | Status: DC | PRN
  Administered 2016-10-10: 160 mg via INTRAVENOUS

## 2016-10-10 MED ORDER — FENTANYL CITRATE (PF) 100 MCG/2ML IJ SOLN
INTRAMUSCULAR | Status: AC
  Filled 2016-10-10: qty 2

## 2016-10-10 MED ORDER — HYDROMORPHONE HCL 2 MG/ML IJ SOLN
1.0000 mg | Freq: Once | INTRAMUSCULAR | Status: AC
  Administered 2016-10-10: 1 mg via INTRAVENOUS
  Filled 2016-10-10: qty 1

## 2016-10-10 MED ORDER — PHENYLEPHRINE HCL 10 MG/ML IJ SOLN
INTRAMUSCULAR | Status: DC | PRN
  Administered 2016-10-10: 120 ug via INTRAVENOUS
  Administered 2016-10-10: 40 ug via INTRAVENOUS
  Administered 2016-10-10: 80 ug via INTRAVENOUS

## 2016-10-10 MED ORDER — FENTANYL CITRATE (PF) 100 MCG/2ML IJ SOLN
INTRAMUSCULAR | Status: AC
  Filled 2016-10-10: qty 4

## 2016-10-10 MED ORDER — FENTANYL CITRATE (PF) 100 MCG/2ML IJ SOLN
INTRAMUSCULAR | Status: AC
  Administered 2016-10-10: 50 ug via INTRAVENOUS
  Filled 2016-10-10: qty 2

## 2016-10-10 MED ORDER — DEXTROSE 5 % IV SOLN
2.0000 g | INTRAVENOUS | Status: DC
  Administered 2016-10-10 – 2016-10-11 (×2): 2 g via INTRAVENOUS
  Filled 2016-10-10 (×3): qty 2

## 2016-10-10 MED ORDER — ONDANSETRON 4 MG PO TBDP: 4.0000 mg | ORAL_TABLET | Freq: Four times a day (QID) | ORAL | Status: DC | PRN

## 2016-10-10 MED ORDER — PHENYLEPHRINE 40 MCG/ML (10ML) SYRINGE FOR IV PUSH (FOR BLOOD PRESSURE SUPPORT)
PREFILLED_SYRINGE | INTRAVENOUS | Status: AC
  Filled 2016-10-10: qty 10

## 2016-10-10 MED ORDER — DEXAMETHASONE SODIUM PHOSPHATE 10 MG/ML IJ SOLN
INTRAMUSCULAR | Status: AC
  Filled 2016-10-10: qty 1

## 2016-10-10 MED ORDER — GI COCKTAIL ~~LOC~~
30.0000 mL | Freq: Once | ORAL | Status: AC
  Administered 2016-10-10: 30 mL via ORAL
  Filled 2016-10-10: qty 30

## 2016-10-10 MED ORDER — SODIUM CHLORIDE 0.9 % IR SOLN
Status: DC | PRN
  Administered 2016-10-10 (×2): 1000 mL

## 2016-10-10 MED ORDER — BUPIVACAINE HCL (PF) 0.25 % IJ SOLN
INTRAMUSCULAR | Status: DC | PRN
  Administered 2016-10-10: 10 mL

## 2016-10-10 MED ORDER — FENTANYL CITRATE (PF) 100 MCG/2ML IJ SOLN
INTRAMUSCULAR | Status: DC | PRN
  Administered 2016-10-10 (×3): 50 ug via INTRAVENOUS
  Administered 2016-10-10: 100 ug via INTRAVENOUS
  Administered 2016-10-10: 50 ug via INTRAVENOUS
  Administered 2016-10-10 (×2): 100 ug via INTRAVENOUS

## 2016-10-10 MED ORDER — MIDAZOLAM HCL 5 MG/5ML IJ SOLN
INTRAMUSCULAR | Status: DC | PRN
  Administered 2016-10-10: 2 mg via INTRAVENOUS

## 2016-10-10 MED ORDER — FENTANYL CITRATE (PF) 100 MCG/2ML IJ SOLN
25.0000 ug | INTRAMUSCULAR | Status: DC | PRN
  Administered 2016-10-10 (×3): 50 ug via INTRAVENOUS

## 2016-10-10 MED ORDER — MORPHINE SULFATE (PF) 4 MG/ML IV SOLN: 1.0000 mg | INTRAVENOUS | Status: DC | PRN

## 2016-10-10 MED ORDER — SODIUM CHLORIDE 0.9 % IV SOLN
INTRAVENOUS | Status: DC
  Administered 2016-10-10: 10:00:00 via INTRAVENOUS

## 2016-10-10 MED ORDER — MIDAZOLAM HCL 2 MG/2ML IJ SOLN
INTRAMUSCULAR | Status: AC
  Filled 2016-10-10: qty 2

## 2016-10-10 MED ORDER — ROCURONIUM BROMIDE 50 MG/5ML IV SOSY
PREFILLED_SYRINGE | INTRAVENOUS | Status: AC
  Filled 2016-10-10: qty 5

## 2016-10-10 MED ORDER — SUCCINYLCHOLINE CHLORIDE 200 MG/10ML IV SOSY
PREFILLED_SYRINGE | INTRAVENOUS | Status: AC
  Filled 2016-10-10: qty 10

## 2016-10-10 MED ORDER — OXYCODONE-ACETAMINOPHEN 5-325 MG PO TABS
1.0000 | ORAL_TABLET | ORAL | Status: DC | PRN
  Administered 2016-10-10 – 2016-10-11 (×4): 1 via ORAL
  Filled 2016-10-10 (×4): qty 1

## 2016-10-10 MED ORDER — LORAZEPAM 2 MG/ML IJ SOLN: 0.2500 mg | Freq: Once | INTRAMUSCULAR | Status: DC | PRN

## 2016-10-10 MED ORDER — LIDOCAINE 2% (20 MG/ML) 5 ML SYRINGE
INTRAMUSCULAR | Status: AC
  Filled 2016-10-10: qty 5

## 2016-10-10 MED ORDER — ONDANSETRON HCL 4 MG/2ML IJ SOLN
INTRAMUSCULAR | Status: DC | PRN
  Administered 2016-10-10: 4 mg via INTRAVENOUS

## 2016-10-10 MED ORDER — PROPOFOL 10 MG/ML IV BOLUS
INTRAVENOUS | Status: DC | PRN
  Administered 2016-10-10: 180 mg via INTRAVENOUS

## 2016-10-10 MED ORDER — ONDANSETRON 4 MG PO TBDP
4.0000 mg | ORAL_TABLET | Freq: Once | ORAL | Status: AC
  Administered 2016-10-10: 4 mg via ORAL
  Filled 2016-10-10: qty 1

## 2016-10-10 MED ORDER — LACTATED RINGERS IV SOLN
INTRAVENOUS | Status: DC | PRN
  Administered 2016-10-10: 12:00:00 via INTRAVENOUS

## 2016-10-10 MED ORDER — SUGAMMADEX SODIUM 200 MG/2ML IV SOLN
INTRAVENOUS | Status: AC
  Filled 2016-10-10: qty 2

## 2016-10-10 MED ORDER — SUGAMMADEX SODIUM 200 MG/2ML IV SOLN
INTRAVENOUS | Status: DC | PRN
Start: 2016-10-10 — End: 2016-10-10
  Administered 2016-10-10: 160 mg via INTRAVENOUS

## 2016-10-10 MED ORDER — 0.9 % SODIUM CHLORIDE (POUR BTL) OPTIME
TOPICAL | Status: DC | PRN
Start: 2016-10-10 — End: 2016-10-10
  Administered 2016-10-10: 1000 mL

## 2016-10-10 MED ORDER — HYDROMORPHONE HCL 2 MG/ML IJ SOLN: 1.0000 mg | INTRAMUSCULAR | Status: DC | PRN

## 2016-10-10 MED ORDER — ONDANSETRON HCL 4 MG/2ML IJ SOLN
INTRAMUSCULAR | Status: AC
  Filled 2016-10-10: qty 2

## 2016-10-10 MED ORDER — BUPIVACAINE HCL (PF) 0.25 % IJ SOLN
INTRAMUSCULAR | Status: AC
  Filled 2016-10-10: qty 30

## 2016-10-10 MED ORDER — LIDOCAINE HCL (CARDIAC) 20 MG/ML IV SOLN
INTRAVENOUS | Status: DC | PRN
  Administered 2016-10-10: 100 mg via INTRAVENOUS

## 2016-10-10 MED ORDER — DEXTROSE 5 % IV SOLN
0.5000 mg/h | INTRAVENOUS | Status: DC
  Filled 2016-10-10: qty 25

## 2016-10-10 MED ORDER — SODIUM CHLORIDE 0.9 % IV SOLN
INTRAVENOUS | Status: DC
  Administered 2016-10-10: 17:00:00 via INTRAVENOUS

## 2016-10-10 MED ORDER — DEXAMETHASONE SODIUM PHOSPHATE 10 MG/ML IJ SOLN
INTRAMUSCULAR | Status: DC | PRN
  Administered 2016-10-10: 5 mg via INTRAVENOUS

## 2016-10-10 MED ORDER — ONDANSETRON HCL 4 MG/2ML IJ SOLN
4.0000 mg | Freq: Four times a day (QID) | INTRAMUSCULAR | Status: DC | PRN
  Administered 2016-10-10: 4 mg via INTRAVENOUS
  Filled 2016-10-10: qty 2

## 2016-10-10 SURGICAL SUPPLY — 40 items
APPLIER CLIP ROT 10 11.4 M/L (STAPLE) ×2
BLADE CLIPPER SURG (BLADE) ×2 IMPLANT
CANISTER SUCT 3000ML PPV (MISCELLANEOUS) ×2 IMPLANT
CHLORAPREP W/TINT 26ML (MISCELLANEOUS) ×2 IMPLANT
CLIP APPLIE ROT 10 11.4 M/L (STAPLE) ×1 IMPLANT
COVER MAYO STAND STRL (DRAPES) ×2 IMPLANT
COVER SURGICAL LIGHT HANDLE (MISCELLANEOUS) ×2 IMPLANT
DERMABOND ADHESIVE PROPEN (GAUZE/BANDAGES/DRESSINGS) ×1
DERMABOND ADVANCED (GAUZE/BANDAGES/DRESSINGS) ×1
DERMABOND ADVANCED .7 DNX12 (GAUZE/BANDAGES/DRESSINGS) ×1 IMPLANT
DERMABOND ADVANCED .7 DNX6 (GAUZE/BANDAGES/DRESSINGS) ×1 IMPLANT
DRAPE C-ARM 42X72 X-RAY (DRAPES) ×2 IMPLANT
DRAPE WARM FLUID 44X44 (DRAPE) ×2 IMPLANT
ELECT REM PT RETURN 9FT ADLT (ELECTROSURGICAL) ×2
ELECTRODE REM PT RTRN 9FT ADLT (ELECTROSURGICAL) ×1 IMPLANT
GLOVE BIO SURGEON STRL SZ8 (GLOVE) ×2 IMPLANT
GLOVE BIOGEL PI IND STRL 8 (GLOVE) ×1 IMPLANT
GLOVE BIOGEL PI INDICATOR 8 (GLOVE) ×1
GOWN STRL REUS W/ TWL LRG LVL3 (GOWN DISPOSABLE) ×2 IMPLANT
GOWN STRL REUS W/ TWL XL LVL3 (GOWN DISPOSABLE) ×1 IMPLANT
GOWN STRL REUS W/TWL LRG LVL3 (GOWN DISPOSABLE) ×2
GOWN STRL REUS W/TWL XL LVL3 (GOWN DISPOSABLE) ×1
KIT BASIN OR (CUSTOM PROCEDURE TRAY) ×2 IMPLANT
KIT ROOM TURNOVER OR (KITS) ×2 IMPLANT
NS IRRIG 1000ML POUR BTL (IV SOLUTION) ×2 IMPLANT
PAD ARMBOARD 7.5X6 YLW CONV (MISCELLANEOUS) ×2 IMPLANT
POUCH SPECIMEN RETRIEVAL 10MM (ENDOMECHANICALS) ×2 IMPLANT
SCISSORS LAP 5X35 DISP (ENDOMECHANICALS) ×2 IMPLANT
SET CHOLANGIOGRAPH 5 50 .035 (SET/KITS/TRAYS/PACK) ×2 IMPLANT
SET IRRIG TUBING LAPAROSCOPIC (IRRIGATION / IRRIGATOR) ×2 IMPLANT
SLEEVE ENDOPATH XCEL 5M (ENDOMECHANICALS) ×2 IMPLANT
SPECIMEN JAR SMALL (MISCELLANEOUS) ×2 IMPLANT
SUT MNCRL AB 4-0 PS2 18 (SUTURE) ×2 IMPLANT
TOWEL OR 17X24 6PK STRL BLUE (TOWEL DISPOSABLE) ×2 IMPLANT
TOWEL OR 17X26 10 PK STRL BLUE (TOWEL DISPOSABLE) ×2 IMPLANT
TRAY LAPAROSCOPIC MC (CUSTOM PROCEDURE TRAY) ×2 IMPLANT
TROCAR XCEL BLUNT TIP 100MML (ENDOMECHANICALS) ×2 IMPLANT
TROCAR XCEL NON-BLD 11X100MML (ENDOMECHANICALS) ×2 IMPLANT
TROCAR XCEL NON-BLD 5MMX100MML (ENDOMECHANICALS) ×2 IMPLANT
TUBING INSUFFLATION (TUBING) ×2 IMPLANT

## 2016-10-10 NOTE — Op Note (Signed)
Laparoscopic Cholecystectomy  Procedure Note  Indications: This patient presents with symptomatic gallbladder disease and will undergo laparoscopic cholecystectomy. The procedure has been discussed with the patient. Operative and non operative treatments have been discussed. Risks of surgery include bleeding, infection,  Common bile duct injury,  Injury to the stomach,liver, colon,small intestine, abdominal wall,  Diaphragm,  Major blood vessels,  And the need for an open procedure.  Other risks include worsening of medical problems, death,  DVT and pulmonary embolism, and cardiovascular events.   Medical options have also been discussed. The patient has been informed of long term expectations of surgery and non surgical options,  The patient agrees to proceed.    Pre-operative Diagnosis: Calculus of gallbladder with acute cholecystitis, without mention of obstruction  Post-operative Diagnosis: Same  Surgeon: Maralee Higuchi A.   Assistants: OR staff  Anesthesia: General endotracheal anesthesia and Local anesthesia 0.25.% bupivacaine, with epinephrine  ASA Class: 2  Procedure Details  The patient was seen again in the Holding Room. The risks, benefits, complications, treatment options, and expected outcomes were discussed with the patient. The possibilities of reaction to medication, pulmonary aspiration, perforation of viscus, bleeding, recurrent infection, finding a normal gallbladder, the need for additional procedures, failure to diagnose a condition, the possible need to convert to an open procedure, and creating a complication requiring transfusion or operation were discussed with the patient. The patient and/or family concurred with the proposed plan, giving informed consent. The site of surgery properly noted/marked. The patient was taken to Operating Room, identified as Kimberly Kelly and the procedure verified as Laparoscopic Cholecystectomy with Intraoperative Cholangiograms. A Time Out was  held and the above information confirmed.  Prior to the induction of general anesthesia, antibiotic prophylaxis was administered. General endotracheal anesthesia was then administered and tolerated well. After the induction, the abdomen was prepped in the usual sterile fashion. The patient was positioned in the supine position with the left arm comfortably tucked, along with some reverse Trendelenburg.  Local anesthetic agent was injected into the skin near the umbilicus and an incision made. The midline fascia was incised and the Hasson technique was used to introduce a 12 mm port under direct vision. It was secured with a figure of eight Vicryl suture placed in the usual fashion. Pneumoperitoneum was then created with CO2 and tolerated well without any adverse changes in the patient's vital signs. Additional trocars were introduced under direct vision with an 11 mm trocar in the epigastrium and 2 5 mm trocars in the right upper quadrant. All skin incisions were infiltrated with a local anesthetic agent before making the incision and placing the trocars.   The gallbladder was identified, the fundus grasped and retracted cephalad. Adhesions were lysed bluntly and with the electrocautery where indicated, taking care not to injure any adjacent organs or viscus. The infundibulum was grasped and retracted laterally, exposing the peritoneum overlying the triangle of Calot. This was then divided and exposed in a blunt fashion. The cystic duct was clearly identified and bluntly dissected circumferentially. The junctions of the gallbladder, cystic duct and common bile duct were clearly identified prior to the division of any linear structure.   The cystic duct was very small and I did not feel cholangiogram was possible to to minute size of cystic duct.  The CBD was 4 mm on U/S and LFT's were normal.  The critical view was obtained as well so cholangiogram was not performed.    The cystic duct was then  ligated  with  surgical clips  on the patient side and  clipped on the gallbladder side and divided. The cystic artery was identified, dissected free, ligated with clips and divided as well. Posterior cystic artery clipped and divided.  The gallbladder was dissected from the liver bed in retrograde fashion with the electrocautery. The gallbladder was removed and placed into an Endocatch  Bag.  The liver bed was irrigated and inspected. Hemostasis was achieved with the electrocautery. Copious irrigation was utilized and was repeatedly aspirated until clear all particulate matter. Hemostasis was achieved with no signs of bleeding or bile leakage. The gall bladder was extracted through the umbilical site.  Fascia at umbilicus closed with 0 Vicryl.    Pneumoperitoneum was completely reduced after viewing removal of the trocars under direct vision. The wound was thoroughly irrigated and the fascia was then closed with a figure of eight suture; the skin was then closed with 4 O monocryl  and a sterile dressing  Of Dermabond was applied.  Instrument, sponge, and needle counts were correct at closure and at the conclusion of the case.   Findings: Cholecystitis with Cholelithiasis  Estimated Blood Loss: less than 50 mL         Drains: none         Total IV Fluids: 900 mL         Specimens: Gallbladder           Complications: None; patient tolerated the procedure well.         Disposition: PACU - hemodynamically stable.         Condition: stable

## 2016-10-10 NOTE — Progress Notes (Signed)
C/O intermittent pain - has to be woken up to assess pain- no other PACU  pain meds per Dr Nyoka Cowden at this time - HR 84, O2 sat on 3 L 100%- pt snoring in between medications

## 2016-10-10 NOTE — ED Triage Notes (Signed)
Pt arrived via POV from home c/o central chest pain started last night.  Pt states "it feels like really bad heartburn"

## 2016-10-10 NOTE — Anesthesia Procedure Notes (Addendum)
Procedure Name: Intubation Date/Time: 10/10/2016 11:50 AM Performed by: Salli Quarry Susannah Carbin Pre-anesthesia Checklist: Patient identified, Emergency Drugs available, Suction available and Patient being monitored Patient Re-evaluated:Patient Re-evaluated prior to inductionOxygen Delivery Method: Circle System Utilized Preoxygenation: Pre-oxygenation with 100% oxygen Intubation Type: IV induction, Rapid sequence and Cricoid Pressure applied Laryngoscope Size: Glidescope and 4 Grade View: Grade I Tube type: Oral Tube size: 7.0 mm Number of attempts: 2 Airway Equipment and Method: Stylet Placement Confirmation: ETT inserted through vocal cords under direct vision,  positive ETCO2 and breath sounds checked- equal and bilateral Secured at: 22 cm Tube secured with: Tape Dental Injury: Teeth and Oropharynx as per pre-operative assessment  Comments: DLx1 with MAC 3, grade 2 view with anterior airway, unable to advance ETT due to anterior airway; decision to perform elective glidescope due to RSI, DLx2 with Glidescope 4, grade 1 view, atraumatic intubation of 7.0 oral ETT.

## 2016-10-10 NOTE — ED Notes (Signed)
Surgeon at bedside.  

## 2016-10-10 NOTE — Transfer of Care (Signed)
Immediate Anesthesia Transfer of Care Note  Patient: Kimberly Kelly  Procedure(s) Performed: Procedure(s): LAPAROSCOPIC CHOLECYSTECTOMY WITH INTRAOPERATIVE CHOLANGIOGRAM (N/A)  Patient Location: PACU  Anesthesia Type:General  Level of Consciousness: awake, alert  and patient cooperative  Airway & Oxygen Therapy: Patient Spontanous Breathing and Patient connected to nasal cannula oxygen  Post-op Assessment: Report given to RN and Post -op Vital signs reviewed and stable  Post vital signs: Reviewed and stable  Last Vitals:  Vitals:   10/10/16 0945 10/10/16 1000  BP: 120/74 130/89  Pulse: 97 88  Resp: 20 18    Last Pain:  Vitals:   10/10/16 0845  PainSc: 5          Complications: No apparent anesthesia complications

## 2016-10-10 NOTE — Anesthesia Preprocedure Evaluation (Addendum)
Anesthesia Evaluation  Patient identified by MRN, date of birth, ID band Patient awake    Reviewed: Allergy & Precautions, NPO status , Patient's Chart, lab work & pertinent test results  Airway Mallampati: II  TM Distance: >3 FB     Dental   Pulmonary neg pulmonary ROS,    breath sounds clear to auscultation       Cardiovascular negative cardio ROS   Rhythm:Regular Rate:Normal     Neuro/Psych    GI/Hepatic Neg liver ROS, GI history noted. CG   Endo/Other  negative endocrine ROS  Renal/GU negative Renal ROS     Musculoskeletal  (+) Arthritis ,   Abdominal   Peds  Hematology   Anesthesia Other Findings   Reproductive/Obstetrics                           Anesthesia Physical Anesthesia Plan  ASA: II  Anesthesia Plan: General   Post-op Pain Management:    Induction: Intravenous  Airway Management Planned: Oral ETT  Additional Equipment:   Intra-op Plan:   Post-operative Plan: Extubation in OR  Informed Consent: I have reviewed the patients History and Physical, chart, labs and discussed the procedure including the risks, benefits and alternatives for the proposed anesthesia with the patient or authorized representative who has indicated his/her understanding and acceptance.     Plan Discussed with: Anesthesiologist and CRNA  Anesthesia Plan Comments:        Anesthesia Quick Evaluation

## 2016-10-10 NOTE — H&P (Signed)
Kimberly Kelly is an 46 y.o. female.   Chief Complaint: Upper abdominal pain going to her chest HPI: 82-year-old female who presented with complaints of upper abdominal pain going into her chest, this started around 9:00 last evening. She's had nausea and belching and bloating. She had similar episodes since December there would last couple hours. She thought these were related to reflux. She treated these with an acids and got some results but this was the most severe. Symptoms are not relieved with Tums and Zantac. Nothing she did at home improved her symptoms. She presented to the ED early this a.m.  Workup in the emergency department shows she is afebrile, blood pressure 133/94 on admission. Labs show a glucose of 140 but otherwise normal. Lipase is 17. WBC is 10.1. Troponin was negative. Chest x-ray was normal. EKG showed normal sinus rhythm with no significant changes. Abdominal ultrasound  shows stones present within the gallbladder lumen the largest is 2.4 cm and a 1.2 cm stone was present in the gallbladder neck and appeared largely nonmobile. Gallbladder wall measured within normal limits at 2.5 mm there was no free pericholecystic fluid and no Murphy sign on exam. Tone bile duct was normal at 4.3 cm liver appeared normal also. We are asked to see.  Past Medical History:  Diagnosis Date  . Arthritis     Past Surgical History:  Procedure Laterality Date  . CESAREAN SECTION    . SHOULDER ARTHROSCOPY    . TUBAL LIGATION      History reviewed. No pertinent family history. Social History:  reports that she has never smoked. She has never used smokeless tobacco. She reports that she drinks about 4.2 oz of alcohol per week . She reports that she does not use drugs.  Allergies: No Known Allergies  Prior to Admission medications   Medication Sig Start Date End Date Taking? Authorizing Provider  celecoxib (CELEBREX) 200 MG capsule Take 200 mg by mouth daily as needed for mild pain.  08/13/16   Yes Historical Provider, MD  predniSONE (STERAPRED UNI-PAK 21 TAB) 10 MG (21) TBPK tablet Take 1 tablet (10 mg total) by mouth daily. Take 6 tabs by mouth daily  for 2 days, then 5 tabs for 2 days, then 4 tabs for 2 days, then 3 tabs for 2 days, 2 tabs for 2 days, then 1 tab by mouth daily for 2 days Patient not taking: Reported on 10/10/2016 04/23/15   Delsa Grana, PA-C     Results for orders placed or performed during the hospital encounter of 10/10/16 (from the past 48 hour(s))  Comprehensive metabolic panel     Status: Abnormal   Collection Time: 10/10/16  6:22 AM  Result Value Ref Range   Sodium 139 135 - 145 mmol/L   Potassium 3.9 3.5 - 5.1 mmol/L   Chloride 104 101 - 111 mmol/L   CO2 24 22 - 32 mmol/L   Glucose, Bld 140 (H) 65 - 99 mg/dL   BUN 13 6 - 20 mg/dL   Creatinine, Ser 0.83 0.44 - 1.00 mg/dL   Calcium 9.4 8.9 - 10.3 mg/dL   Total Protein 6.9 6.5 - 8.1 g/dL   Albumin 4.1 3.5 - 5.0 g/dL   AST 23 15 - 41 U/L   ALT 30 14 - 54 U/L   Alkaline Phosphatase 55 38 - 126 U/L   Total Bilirubin 0.4 0.3 - 1.2 mg/dL   GFR calc non Af Amer >60 >60 mL/min   GFR calc Af  Amer >60 >60 mL/min    Comment: (NOTE) The eGFR has been calculated using the CKD EPI equation. This calculation has not been validated in all clinical situations. eGFR's persistently <60 mL/min signify possible Chronic Kidney Disease.    Anion gap 11 5 - 15  CBC with Differential     Status: Abnormal   Collection Time: 10/10/16  6:22 AM  Result Value Ref Range   WBC 10.1 4.0 - 10.5 K/uL   RBC 4.78 3.87 - 5.11 MIL/uL   Hemoglobin 13.9 12.0 - 15.0 g/dL   HCT 41.4 36.0 - 46.0 %   MCV 86.6 78.0 - 100.0 fL   MCH 29.1 26.0 - 34.0 pg   MCHC 33.6 30.0 - 36.0 g/dL   RDW 13.4 11.5 - 15.5 %   Platelets 390 150 - 400 K/uL   Neutrophils Relative % 84 %   Neutro Abs 8.5 (H) 1.7 - 7.7 K/uL   Lymphocytes Relative 11 %   Lymphs Abs 1.1 0.7 - 4.0 K/uL   Monocytes Relative 3 %   Monocytes Absolute 0.3 0.1 - 1.0 K/uL    Eosinophils Relative 1 %   Eosinophils Absolute 0.1 0.0 - 0.7 K/uL   Basophils Relative 1 %   Basophils Absolute 0.1 0.0 - 0.1 K/uL  Lipase, blood     Status: None   Collection Time: 10/10/16  6:22 AM  Result Value Ref Range   Lipase 17 11 - 51 U/L  I-stat troponin, ED     Status: None   Collection Time: 10/10/16  6:27 AM  Result Value Ref Range   Troponin i, poc 0.00 0.00 - 0.08 ng/mL   Comment 3            Comment: Due to the release kinetics of cTnI, a negative result within the first hours of the onset of symptoms does not rule out myocardial infarction with certainty. If myocardial infarction is still suspected, repeat the test at appropriate intervals.    Dg Chest 2 View  Result Date: 10/10/2016 CLINICAL DATA:  Initial evaluation for acute chest pain, nausea. EXAM: CHEST  2 VIEW COMPARISON:  None. FINDINGS: The cardiac and mediastinal silhouettes are within normal limits. The lungs are normally inflated. No airspace consolidation, pleural effusion, or pulmonary edema is identified. There is no pneumothorax. No acute osseous abnormality identified. IMPRESSION: No active cardiopulmonary disease. Electronically Signed   By: Jeannine Boga M.D.   On: 10/10/2016 07:24   US Abdomen Limited Ruq  Result Date: 10/10/2016 CLINICAL DATA:  Initial evaluation for acute epigastric pain. EXAM: US ABDOMEN LIMITED - RIGHT UPPER QUADRANT COMPARISON:  None available. FINDINGS: Gallbladder: Shadowing echogenic stones present within the gallbladder lumen, largest of which measured 2.4 cm. A 1.2 cm stone was present at the gallbladder neck, which appeared largely nonmobile. Gallbladder wall measured within normal limits at 2.5 mm. No free pericholecystic fluid. No sonographic Murphy sign elicited on exam. Common bile duct: Diameter: 4.3 mm Liver: No focal lesion identified. Within normal limits in parenchymal echogenicity. IMPRESSION: 1. Cholelithiasis. A 1.2 cm nonmobile stone appears to be lodged  at the gallbladder neck. No sonographic features to suggest acute cholecystitis. 2. No biliary dilatation. Electronically Signed   By: Jeannine Boga M.D.   On: 10/10/2016 07:23    Review of Systems  Constitutional: Negative.   HENT: Negative.   Eyes: Negative.   Respiratory: Negative.   Cardiovascular: Negative.   Gastrointestinal: Positive for abdominal pain, heartburn and nausea. Negative for blood in stool,  constipation, diarrhea, melena and vomiting.  Genitourinary: Negative.   Musculoskeletal:       She has issues with arthritis  Skin: Negative.   Neurological: Negative.   Endo/Heme/Allergies: Negative.   Psychiatric/Behavioral: The patient is nervous/anxious.     Blood pressure 115/71, pulse 92, resp. rate 20, SpO2 98 %. Physical Exam  Constitutional: She is oriented to person, place, and time. She appears well-developed and well-nourished. No distress.  HENT:  Head: Normocephalic and atraumatic.  Mouth/Throat: No oropharyngeal exudate.  Eyes: Right eye exhibits no discharge. Left eye exhibits no discharge. No scleral icterus.  Neck: Normal range of motion. Neck supple. No JVD present. No tracheal deviation present. No thyromegaly present.  Cardiovascular: Normal rate, regular rhythm, normal heart sounds and intact distal pulses.   No murmur heard. Respiratory: Effort normal and breath sounds normal. No respiratory distress. She has no wheezes. She has no rales. She exhibits no tenderness.  GI: Soft. She exhibits no distension and no mass. There is tenderness (Currently her pain is mostly in the right upper quadrant. It was going to her back earlier. Nausea resolved with Zofran.). There is no rebound and no guarding.  Musculoskeletal: She exhibits no edema or tenderness.  Lymphadenopathy:    She has no cervical adenopathy.  Neurological: She is alert and oriented to person, place, and time.  Skin: Skin is warm and dry. No rash noted. She is not diaphoretic. No  erythema. No pallor.  Psychiatric: She has a normal mood and affect. Her behavior is normal. Judgment and thought content normal.     Assessment/Plan Symptomatic cholelithiasis. Arthritis Situational anxiety.  Plan: We will admit her start her on IV fluids and antibiotics. Tentatively plan laparoscopic cholecystectomy today. Risk and benefits were discussed and she is in agreement.  Jamie Hafford, PA-C 10/10/2016, 9:15 AM

## 2016-10-10 NOTE — ED Notes (Signed)
Pt's wallet secured in security in lock box # 5. She has her purse, cell pphone and clothing with her. Report given to robbie, will transport to OR.

## 2016-10-10 NOTE — ED Notes (Signed)
PA at bedside.

## 2016-10-10 NOTE — ED Provider Notes (Signed)
[facility] DEPT Provider Note   CSN: [id number] Arrival date & time: 10/10/16  0551     History   Chief Complaint Chief Complaint  Patient presents with  . Chest Pain    HPI Kimberly Kelly is a 46 y.o. female.  The history is provided by the patient and medical records.   Kimberly Kelly is a 46 y.o. female  with a PMH of arthritis who presents to the Emergency Department complaining of constant upper abdominal pain which radiates up across her entire chest wall since 9pm last night. Associated symptoms include nausea, belching and bloating. She has had 5-6 episodes of similar symptoms since December, but today is the most severe. Typically she will take Tums and zantac which will alleviate symptoms. She tried this today but had no relief. She also tried taking a warm bath and a heating pad, also with little relief. Pain is somewhat better with sitting up. Patient states each time these episodes have occurred, it is at night about 30 minutes to an hour after dinner. No history of HTN, HLD or heart disease. No hx of DM. Not a smoker. Patient denies shortness of breath, jaw pain, diaphoresis, back pain, diarrhea, constipation, vomiting, blood in the stool, dysuria.    Past Medical History:  Diagnosis Date  . Arthritis     Patient Active Problem List   Diagnosis Date Noted  . Symptomatic cholelithiasis 10/10/2016    Past Surgical History:  Procedure Laterality Date  . CESAREAN SECTION    . SHOULDER ARTHROSCOPY    . TUBAL LIGATION      OB History    No data available       Home Medications    Prior to Admission medications   Medication Sig Start Date End Date Taking? Authorizing Provider  celecoxib (CELEBREX) 200 MG capsule Take 200 mg by mouth daily as needed for mild pain.  08/13/16  Yes Historical Provider, MD  predniSONE (STERAPRED UNI-PAK 21 TAB) 10 MG (21) TBPK tablet Take 1 tablet (10 mg total) by mouth daily. Take 6 tabs by mouth daily  for 2 days, then 5 tabs  for 2 days, then 4 tabs for 2 days, then 3 tabs for 2 days, 2 tabs for 2 days, then 1 tab by mouth daily for 2 days Patient not taking: Reported on 10/10/2016 04/23/15   Delsa Grana, PA-C    Family History History reviewed. No pertinent family history.  Social History Social History  Substance Use Topics  . Smoking status: Never Smoker  . Smokeless tobacco: Never Used  . Alcohol use 4.2 oz/week    7 Glasses of wine per week     Allergies   Patient has no known allergies.   Review of Systems Review of Systems  Constitutional: Negative for chills and fever.  HENT: Negative for congestion.   Eyes: Negative for visual disturbance.  Respiratory: Negative for cough and shortness of breath.   Cardiovascular: Positive for chest pain. Negative for palpitations and leg swelling.  Gastrointestinal: Positive for abdominal pain and nausea. Negative for blood in stool, constipation, diarrhea and vomiting.  Genitourinary: Negative for dysuria.  Musculoskeletal: Negative for back pain and neck pain.  Skin: Negative for color change.  Neurological: Negative for headaches.     Physical Exam Updated Vital Signs BP 120/74 (BP Location: Right Arm)   Pulse 97   Resp 20   SpO2 99%   Physical Exam  Constitutional: She is oriented to person, place, and time. She  appears well-developed and well-nourished. No distress.  HENT:  Head: Normocephalic and atraumatic.  Cardiovascular: Normal rate, regular rhythm and normal heart sounds.   No murmur heard. Pulmonary/Chest: Effort normal and breath sounds normal. No respiratory distress. She has no wheezes. She has no rales. She exhibits no tenderness.  Abdominal: Soft. Bowel sounds are normal. She exhibits no distension. There is tenderness (Epigastric and RUQ).  Musculoskeletal: She exhibits no edema.  Neurological: She is alert and oriented to person, place, and time.  Skin: Skin is warm and dry.  Nursing note and vitals reviewed.    ED  Treatments / Results  Labs (all labs ordered are listed, but only abnormal results are displayed) Labs Reviewed  COMPREHENSIVE METABOLIC PANEL - Abnormal; Notable for the following:       Result Value   Glucose, Bld 140 (*)    All other components within normal limits  CBC WITH DIFFERENTIAL/PLATELET - Abnormal; Notable for the following:    Neutro Abs 8.5 (*)    All other components within normal limits  LIPASE, BLOOD  HIV ANTIBODY (ROUTINE TESTING)  I-STAT TROPOININ, ED    EKG  EKG Interpretation  Date/Time:  Saturday October 10 2016 05:59:01 EST Ventricular Rate:  87 PR Interval:  146 QRS Duration: 84 QT Interval:  378 QTC Calculation: 454 R Axis:   79 Text Interpretation:  Normal sinus rhythm Nonspecific ST abnormality No previous tracing Confirmed by Betsey Holiday  MD, CHRISTOPHER [phone]) on 10/10/2016 6:25:23 AM       Radiology Dg Chest 2 View  Result Date: 10/10/2016 CLINICAL DATA:  Initial evaluation for acute chest pain, nausea. EXAM: CHEST  2 VIEW COMPARISON:  None. FINDINGS: The cardiac and mediastinal silhouettes are within normal limits. The lungs are normally inflated. No airspace consolidation, pleural effusion, or pulmonary edema is identified. There is no pneumothorax. No acute osseous abnormality identified. IMPRESSION: No active cardiopulmonary disease. Electronically Signed   By: Jeannine Boga M.D.   On: 10/10/2016 07:24   US Abdomen Limited Ruq  Result Date: 10/10/2016 CLINICAL DATA:  Initial evaluation for acute epigastric pain. EXAM: US ABDOMEN LIMITED - RIGHT UPPER QUADRANT COMPARISON:  None available. FINDINGS: Gallbladder: Shadowing echogenic stones present within the gallbladder lumen, largest of which measured 2.4 cm. A 1.2 cm stone was present at the gallbladder neck, which appeared largely nonmobile. Gallbladder wall measured within normal limits at 2.5 mm. No free pericholecystic fluid. No sonographic Murphy sign elicited on exam. Common bile duct:  Diameter: 4.3 mm Liver: No focal lesion identified. Within normal limits in parenchymal echogenicity. IMPRESSION: 1. Cholelithiasis. A 1.2 cm nonmobile stone appears to be lodged at the gallbladder neck. No sonographic features to suggest acute cholecystitis. 2. No biliary dilatation. Electronically Signed   By: Jeannine Boga M.D.   On: 10/10/2016 07:23    Procedures Procedures (including critical care time)  Medications Ordered in ED Medications  0.9 %  sodium chloride infusion (not administered)  cefTRIAXone (ROCEPHIN) 2 g in dextrose 5 % 50 mL IVPB (not administered)  morphine 4 MG/ML injection 1-4 mg (not administered)  diphenhydrAMINE (BENADRYL) capsule 25 mg (not administered)    Or  diphenhydrAMINE (BENADRYL) injection 25 mg (not administered)  ondansetron (ZOFRAN-ODT) disintegrating tablet 4 mg (not administered)    Or  ondansetron (ZOFRAN) injection 4 mg (not administered)  LORazepam (ATIVAN) 50 mg in dextrose 5 % 50 mL (1 mg/mL) infusion (not administered)  gi cocktail (Maalox,Lidocaine,Donnatal) (30 mLs Oral Given 10/10/16 0622)  ondansetron (ZOFRAN-ODT) disintegrating tablet 4  mg (4 mg Oral Given 10/10/16 0622)  HYDROmorphone (DILAUDID) injection 1 mg (1 mg Intravenous Given 10/10/16 0814)     Initial Impression / Assessment and Plan / ED Course  I have reviewed the triage vital signs and the nursing notes.  Pertinent labs & imaging results that were available during my care of the patient were reviewed by me and considered in my medical decision making (see chart for details).    Kimberly Kelly is a 46 y.o. female who presents to ED for epigastric pain which radiates up to the chest since 9pm last night. History of similar, less severe, episodes 5-6 times in the last four months all of which have occurred shortly after dinner. On exam, patient is afebrile, hemodynamically stable with tenderness to the epigastrium and right upper quadrant. Likely more GI related, but will  continue with chest pain work up as well as ruq ultrasound and lipase. GI cocktail and zofran given.   Cardiac workup reassuring. RUQ ultrasound shows Cholelithiasis with a 1.2 cm nonmobile stone lodged gallbladder neck. No features to suggest acute cholecystitis. Patient was given pain medication and on reevaluation notes very little improvement in her symptoms. General surgery was consulted who will admit.  Final Clinical Impressions(s) / ED Diagnoses   Final diagnoses:  Chest pain  Epigastric pain    New Prescriptions New Prescriptions   No medications on file     [facility] Emilyanne Mcgough, PA-C 10/10/16 1008    Orpah Greek, MD 10/10/16 [phone]

## 2016-10-10 NOTE — Progress Notes (Signed)
ptt stated ht & wt- also states she drinks 1 glass of wine daily  During the week & more on the weekend

## 2016-10-11 LAB — HIV ANTIBODY (ROUTINE TESTING W REFLEX): HIV Screen 4th Generation wRfx: NONREACTIVE

## 2016-10-11 MED ORDER — OXYCODONE-ACETAMINOPHEN 5-325 MG PO TABS: 1.0000 | ORAL_TABLET | ORAL | 0 refills | Status: DC | PRN

## 2016-10-11 MED ORDER — POLYETHYLENE GLYCOL 3350 17 G PO PACK: 17.0000 g | PACK | Freq: Once | ORAL | Status: DC

## 2016-10-11 MED ORDER — SENNOSIDES-DOCUSATE SODIUM 8.6-50 MG PO TABS
2.0000 | ORAL_TABLET | Freq: Once | ORAL | Status: AC
  Administered 2016-10-11: 2 via ORAL
  Filled 2016-10-11: qty 2

## 2016-10-11 NOTE — Progress Notes (Signed)
Discharge instructions gone over. Home medications gone over. Prescriptions given. Follow up appointment to be made. Diet, activity, and incisional care discussed. Reasons to call the doctor discussed. Patient verbalized understanding of instructions.

## 2016-10-11 NOTE — Discharge Instructions (Signed)
[facility] ______[facility] °LAPAROSCOPIC SURGERY: POST OP INSTRUCTIONS °Always review your discharge instruction sheet given to you by the facility where your surgery was performed. °IF YOU HAVE DISABILITY OR FAMILY LEAVE FORMS, YOU MUST BRING THEM TO THE OFFICE FOR PROCESSING.   °DO NOT GIVE THEM TO YOUR DOCTOR. ° °1. A prescription for pain medication may be given to you upon discharge.  Take your pain medication as prescribed, if needed.  If narcotic pain medicine is not needed, then you may take acetaminophen (Tylenol) or ibuprofen (Advil) as needed. °2. Take your usually prescribed medications unless otherwise directed. °3. If you need a refill on your pain medication, please contact your pharmacy.  They will contact our office to request authorization. Prescriptions will not be filled after 5pm or on week-ends. °4. You should follow a light diet the first few days after arrival home, such as soup and crackers, etc.  Be sure to include lots of fluids daily. °5. Most patients will experience some swelling and bruising in the area of the incisions.  Ice packs will help.  Swelling and bruising can take several days to resolve.  °6. It is common to experience some constipation if taking pain medication after surgery.  Increasing fluid intake and taking a stool softener (such as Colace) will usually help or prevent this problem from occurring.  A mild laxative (Milk of Magnesia or Miralax) should be taken according to package instructions if there are no bowel movements after 48 hours. °7. Unless discharge instructions indicate otherwise, you may remove your bandages 24-48 hours after surgery, and you may shower at that time.  You may have steri-strips (small skin tapes) in place directly over the incision.  These strips should be left on the skin for 7-10 days.  If your surgeon used skin glue on the incision, you may shower in 24 hours.  The glue will flake off over the next 2-3 weeks.  Any sutures or  staples will be removed at the office during your follow-up visit. °8. ACTIVITIES:  You may resume regular (light) daily activities beginning the next day--such as daily self-care, walking, climbing stairs--gradually increasing activities as tolerated.  You may have sexual intercourse when it is comfortable.  Refrain from any heavy lifting or straining until approved by your doctor. °a. You may drive when you are no longer taking prescription pain medication, you can comfortably wear a seatbelt, and you can safely maneuver your car and apply brakes. °b. RETURN TO WORK:  __________________________________________________________ °9. You should see your doctor in the office for a follow-up appointment approximately 2-3 weeks after your surgery.  Make sure that you call for this appointment within a day or two after you arrive home to insure a convenient appointment time. °10. OTHER INSTRUCTIONS: __________________________________________________________________________________________________________________________ __________________________________________________________________________________________________________________________ °WHEN TO CALL YOUR DOCTOR: °1. Fever over 101.0 °2. Inability to urinate °3. Continued bleeding from incision. °4. Increased pain, redness, or drainage from the incision. °5. Increasing abdominal pain ° °The clinic staff is available to answer your questions during regular business hours.  Please don’t hesitate to call and ask to speak to one of the nurses for clinical concerns.  If you have a medical emergency, go to the nearest emergency room or call 911.  A surgeon from [facility] is always on call at the hospital. °[address], [address], [place], [place]  [address] ? [address] [address], [place], [place]   [address] °[phone] ? [phone] ? FAX [phone] °Web site:   [email or web address] °

## 2016-10-11 NOTE — Discharge Summary (Signed)
Patient ID: Kimberly Kelly [id number] 45 y.o. March 06, 1971  Admit date: 10/10/2016  Discharge date and time: 10/11/2016  Admitting Physician: Erroll Luna  Discharge Physician: Adin Hector  Admission Diagnoses: Epigastric pain [R10.13] Gallstones [K80.20] Chest pain [R07.9]  Discharge Diagnoses: Cholecystitis with cholelithiasis  Operations: Procedure(s): LAPAROSCOPIC CHOLECYSTECTOMY WITH INTRAOPERATIVE CHOLANGIOGRAM  Admission Condition: fair  Discharged Condition: good  Indication for Admission:  46 year old female who presented with complaints of upper abdominal pain going into her chest, this started around 9:00 last evening. She's had nausea and belching and bloating. She had similar episodes since December there would last couple hours. She thought these were related to reflux. She treated these with an acids and got some results but this was the most severe. Symptoms are not relieved with Tums and Zantac. Nothing she did at home improved her symptoms. She presented to the ED early this a.m.      Workup in the emergency department shows she is afebrile, blood pressure 133/94 on admission. Labs show a glucose of 140 but otherwise normal. Lipase is 17. WBC is 10.1. Troponin was negative. Chest x-ray was normal. EKG showed normal sinus rhythm with no significant changes. Abdominal ultrasound  shows stones present within the gallbladder lumen the largest is 2.4 cm and a 1.2 cm stone was present in the gallbladder neck and appeared largely nonmobile. Gallbladder wall measured within normal limits at 2.5 mm there was no free pericholecystic fluid and no Murphy sign on exam. Tone bile duct was normal at 4.3 cm liver appeared normal also. We are asked to see.  Hospital Course: The patient was evaluated in the emergency department by Dr. Brantley Stage.  It was felt that she had symptomatic gallbladder disease.  She was started on IV fluid resuscitation, analgesics, and antibiotics.  He was  taken to the operating room and underwent laparoscopic cholecystectomy.  The surgery was uneventful.     She was observed overnight and did very well.  On postop day 1 she was asking to go home.  She was ambulating independently.  Tolerating regular diet.  Voiding without difficulty.  Her abdomen was soft and minimally tender.  Not distended.  All trocar sites look good.     She was given instructions in diet and activities.  She was given a prescription for Percocet.  She was asked to call the office on Monday and set up an appointment to see [place] in 3 weeks.  Consults: None  Significant Diagnostic Studies: Lab work and x-ray.  Surgical pathology  Treatments: surgery: Laparoscopic cholecystectomy  Disposition: Home  Patient Instructions:  Allergies as of 10/11/2016   No Known Allergies     Medication List    TAKE these medications   celecoxib 200 MG capsule Commonly known as:  CELEBREX Take 200 mg by mouth daily as needed for mild pain.   oxyCODONE-acetaminophen 5-325 MG tablet Commonly known as:  PERCOCET/ROXICET Take 1 tablet by mouth every 4 (four) hours as needed for moderate pain.   predniSONE 10 MG (21) Tbpk tablet Commonly known as:  STERAPRED UNI-PAK 21 TAB Take 1 tablet (10 mg total) by mouth daily. Take 6 tabs by mouth daily  for 2 days, then 5 tabs for 2 days, then 4 tabs for 2 days, then 3 tabs for 2 days, 2 tabs for 2 days, then 1 tab by mouth daily for 2 days       Activity: No sports or heavy lifting for 3 weeks Diet: low fat, low cholesterol diet Wound Care:  none needed  Follow-up:  With Dr. Brantley Stage in 3 weeks.   The patient was given a prescription for Percocet.  I logged onto the [facility] control substances reporting system and reviewed the patient's medication record.  Signed: Edsel Petrin. Dalbert Batman, M.D., FACS General and minimally invasive surgery Breast and Colorectal Surgery

## 2016-10-12 ENCOUNTER — Encounter (HOSPITAL_COMMUNITY): Payer: Self-pay | Admitting: Surgery

## 2016-10-14 NOTE — Anesthesia Postprocedure Evaluation (Signed)
Anesthesia Post Note  Patient: Kimberly Kelly  Procedure(s) Performed: Procedure(s) (LRB): LAPAROSCOPIC CHOLECYSTECTOMY WITH INTRAOPERATIVE CHOLANGIOGRAM (N/A)  Patient location during evaluation: PACU Anesthesia Type: General Level of consciousness: awake Pain management: pain level controlled Vital Signs Assessment: post-procedure vital signs reviewed and stable Respiratory status: spontaneous breathing Cardiovascular status: stable Anesthetic complications: no       Last Vitals:  Vitals:   10/11/16 0509 10/11/16 0931  BP: 112/75 122/76  Pulse: 92 96  Resp: 18 18  Temp: 36.6 C 36.4 C    Last Pain:  Vitals:   10/11/16 1155  TempSrc:   PainSc: 2                  Tannie Koskela

## 2016-12-16 DIAGNOSIS — R109 Unspecified abdominal pain: Secondary | ICD-10-CM | POA: Diagnosis not present

## 2016-12-16 DIAGNOSIS — Z9049 Acquired absence of other specified parts of digestive tract: Secondary | ICD-10-CM | POA: Diagnosis not present

## 2016-12-16 DIAGNOSIS — R1011 Right upper quadrant pain: Secondary | ICD-10-CM | POA: Diagnosis not present

## 2016-12-16 DIAGNOSIS — R197 Diarrhea, unspecified: Secondary | ICD-10-CM | POA: Diagnosis not present

## 2016-12-17 DIAGNOSIS — R110 Nausea: Secondary | ICD-10-CM | POA: Diagnosis not present

## 2016-12-17 DIAGNOSIS — Z9049 Acquired absence of other specified parts of digestive tract: Secondary | ICD-10-CM | POA: Diagnosis not present

## 2016-12-17 DIAGNOSIS — R1011 Right upper quadrant pain: Secondary | ICD-10-CM | POA: Diagnosis not present

## 2017-01-04 ENCOUNTER — Other Ambulatory Visit: Payer: Self-pay | Admitting: Obstetrics and Gynecology

## 2017-01-04 DIAGNOSIS — H5712 Ocular pain, left eye: Secondary | ICD-10-CM | POA: Diagnosis not present

## 2017-01-04 DIAGNOSIS — Z1231 Encounter for screening mammogram for malignant neoplasm of breast: Secondary | ICD-10-CM

## 2017-01-14 ENCOUNTER — Ambulatory Visit: Payer: BLUE CROSS/BLUE SHIELD

## 2017-01-21 ENCOUNTER — Ambulatory Visit
Admission: RE | Admit: 2017-01-21 | Discharge: 2017-01-21 | Disposition: A | Discharge status: Home / Self Care | Payer: BLUE CROSS/BLUE SHIELD | Source: Ambulatory Visit | Attending: Obstetrics and Gynecology | Admitting: Obstetrics and Gynecology

## 2017-01-21 DIAGNOSIS — Z1231 Encounter for screening mammogram for malignant neoplasm of breast: Secondary | ICD-10-CM

## 2017-02-10 DIAGNOSIS — M2550 Pain in unspecified joint: Secondary | ICD-10-CM | POA: Diagnosis not present

## 2017-02-10 DIAGNOSIS — M150 Primary generalized (osteo)arthritis: Secondary | ICD-10-CM | POA: Diagnosis not present

## 2017-02-10 DIAGNOSIS — Z1589 Genetic susceptibility to other disease: Secondary | ICD-10-CM | POA: Diagnosis not present

## 2017-02-10 DIAGNOSIS — M7662 Achilles tendinitis, left leg: Secondary | ICD-10-CM | POA: Diagnosis not present

## 2017-02-18 ENCOUNTER — Encounter: Payer: Self-pay | Admitting: Nurse Practitioner

## 2017-03-31 DIAGNOSIS — Z23 Encounter for immunization: Secondary | ICD-10-CM | POA: Diagnosis not present

## 2017-05-10 DIAGNOSIS — Z23 Encounter for immunization: Secondary | ICD-10-CM | POA: Diagnosis not present

## 2017-06-09 DIAGNOSIS — M7662 Achilles tendinitis, left leg: Secondary | ICD-10-CM | POA: Diagnosis not present

## 2017-06-09 DIAGNOSIS — M25561 Pain in right knee: Secondary | ICD-10-CM | POA: Diagnosis not present

## 2017-06-09 DIAGNOSIS — M150 Primary generalized (osteo)arthritis: Secondary | ICD-10-CM | POA: Diagnosis not present

## 2017-06-09 DIAGNOSIS — E669 Obesity, unspecified: Secondary | ICD-10-CM | POA: Diagnosis not present

## 2017-06-09 DIAGNOSIS — M2550 Pain in unspecified joint: Secondary | ICD-10-CM | POA: Diagnosis not present

## 2017-06-13 DIAGNOSIS — T162XXA Foreign body in left ear, initial encounter: Secondary | ICD-10-CM | POA: Diagnosis not present

## 2017-07-07 DIAGNOSIS — R0989 Other specified symptoms and signs involving the circulatory and respiratory systems: Secondary | ICD-10-CM | POA: Diagnosis not present

## 2017-07-07 DIAGNOSIS — J208 Acute bronchitis due to other specified organisms: Secondary | ICD-10-CM | POA: Diagnosis not present

## 2017-07-07 DIAGNOSIS — R05 Cough: Secondary | ICD-10-CM | POA: Diagnosis not present

## 2017-07-20 ENCOUNTER — Other Ambulatory Visit: Payer: Self-pay | Admitting: Obstetrics and Gynecology

## 2017-07-20 DIAGNOSIS — N6320 Unspecified lump in the left breast, unspecified quadrant: Secondary | ICD-10-CM

## 2017-07-20 DIAGNOSIS — N6323 Unspecified lump in the left breast, lower outer quadrant: Secondary | ICD-10-CM | POA: Diagnosis not present

## 2017-07-23 DIAGNOSIS — N6002 Solitary cyst of left breast: Secondary | ICD-10-CM | POA: Diagnosis not present

## 2017-07-23 DIAGNOSIS — N6012 Diffuse cystic mastopathy of left breast: Secondary | ICD-10-CM | POA: Diagnosis not present

## 2017-07-26 ENCOUNTER — Other Ambulatory Visit: Payer: BLUE CROSS/BLUE SHIELD

## 2017-09-08 DIAGNOSIS — N939 Abnormal uterine and vaginal bleeding, unspecified: Secondary | ICD-10-CM | POA: Diagnosis not present

## 2017-09-08 DIAGNOSIS — Z6829 Body mass index (BMI) 29.0-29.9, adult: Secondary | ICD-10-CM | POA: Diagnosis not present

## 2017-09-08 DIAGNOSIS — Z01419 Encounter for gynecological examination (general) (routine) without abnormal findings: Secondary | ICD-10-CM | POA: Diagnosis not present

## 2017-09-08 DIAGNOSIS — F419 Anxiety disorder, unspecified: Secondary | ICD-10-CM | POA: Diagnosis not present

## 2017-11-16 IMAGING — DX DG CHEST 2V
2 series · 2 of 2 positions shown · non-contrast
Comparison: None.

CLINICAL DATA: Initial evaluation for acute chest pain, nausea.

EXAM:
CHEST  2 VIEW

[w chest pa]
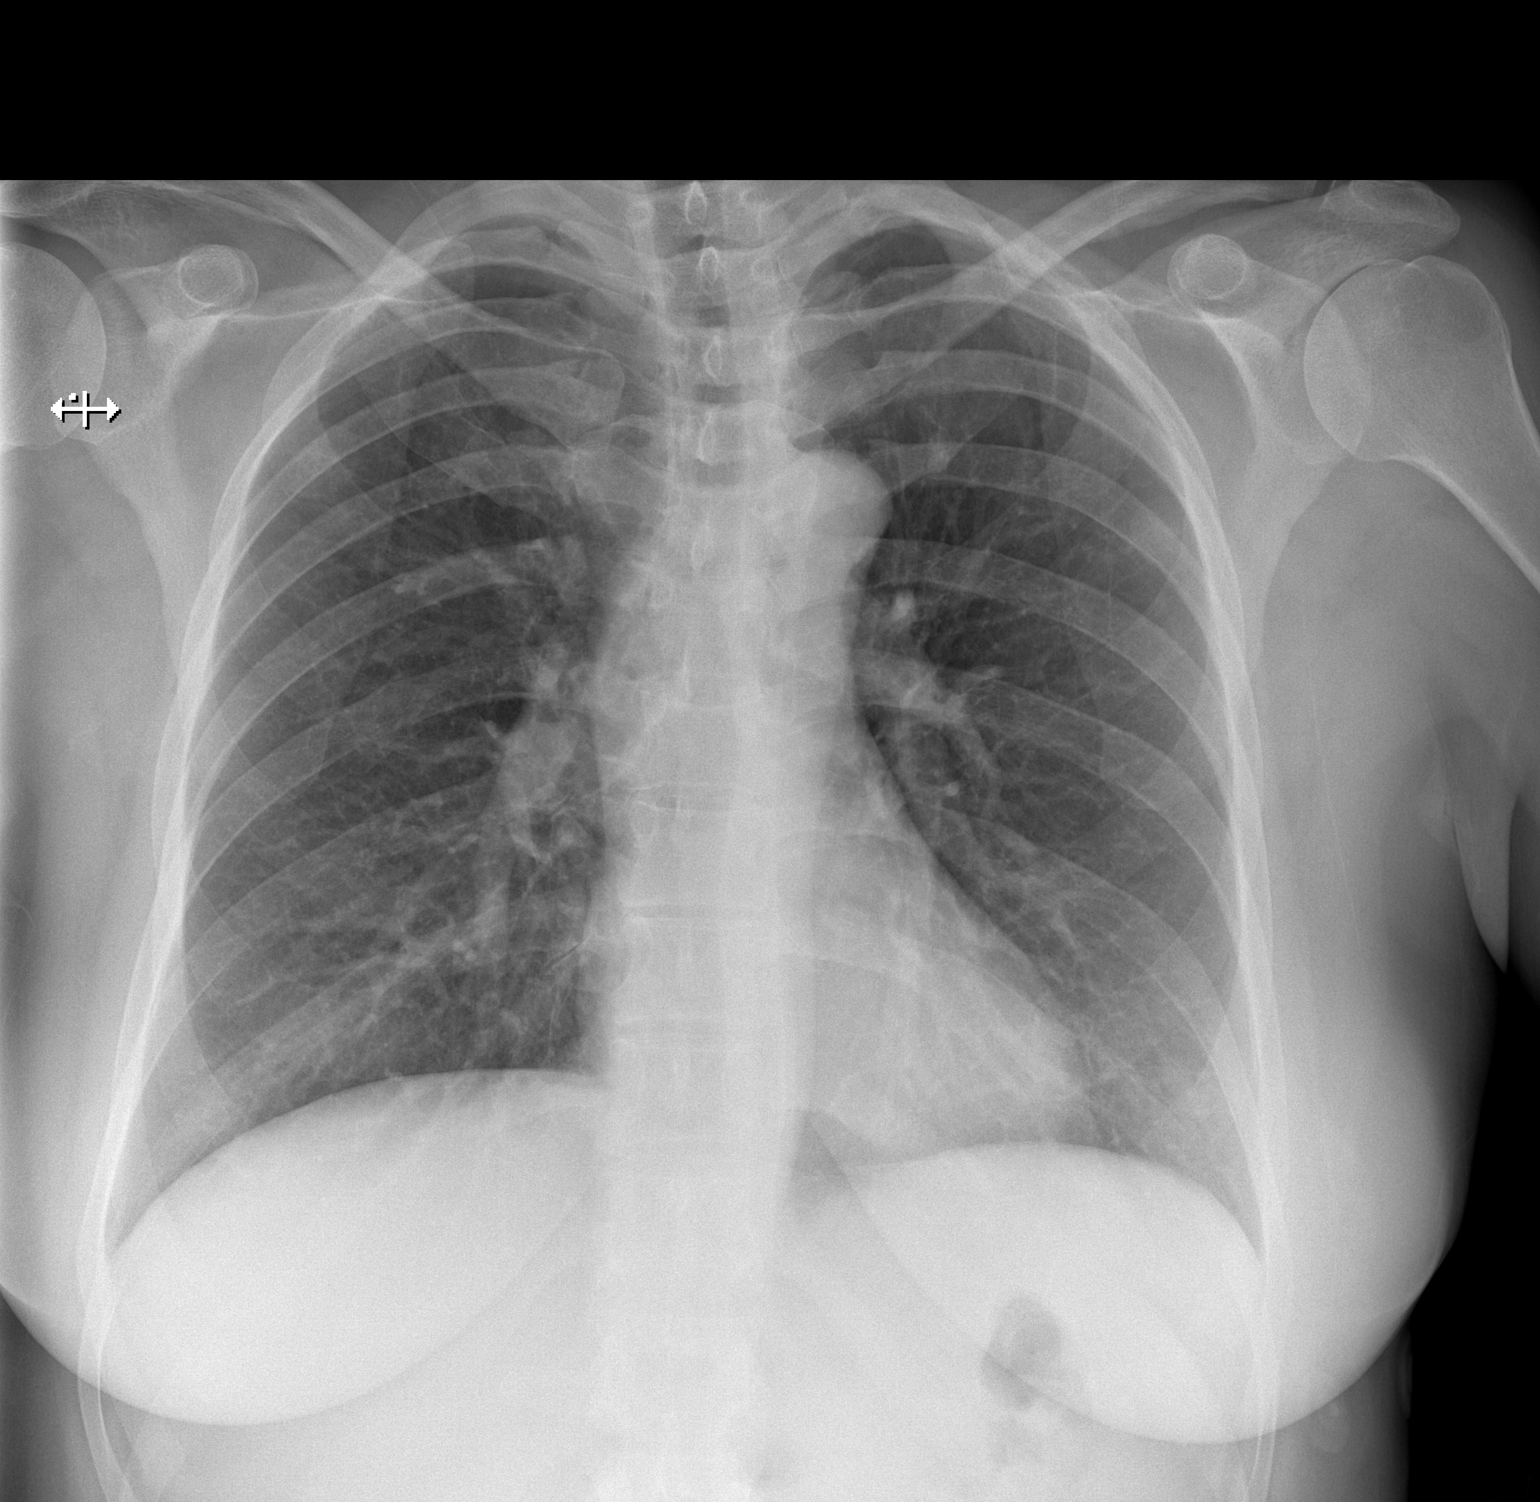

[w chest lat]
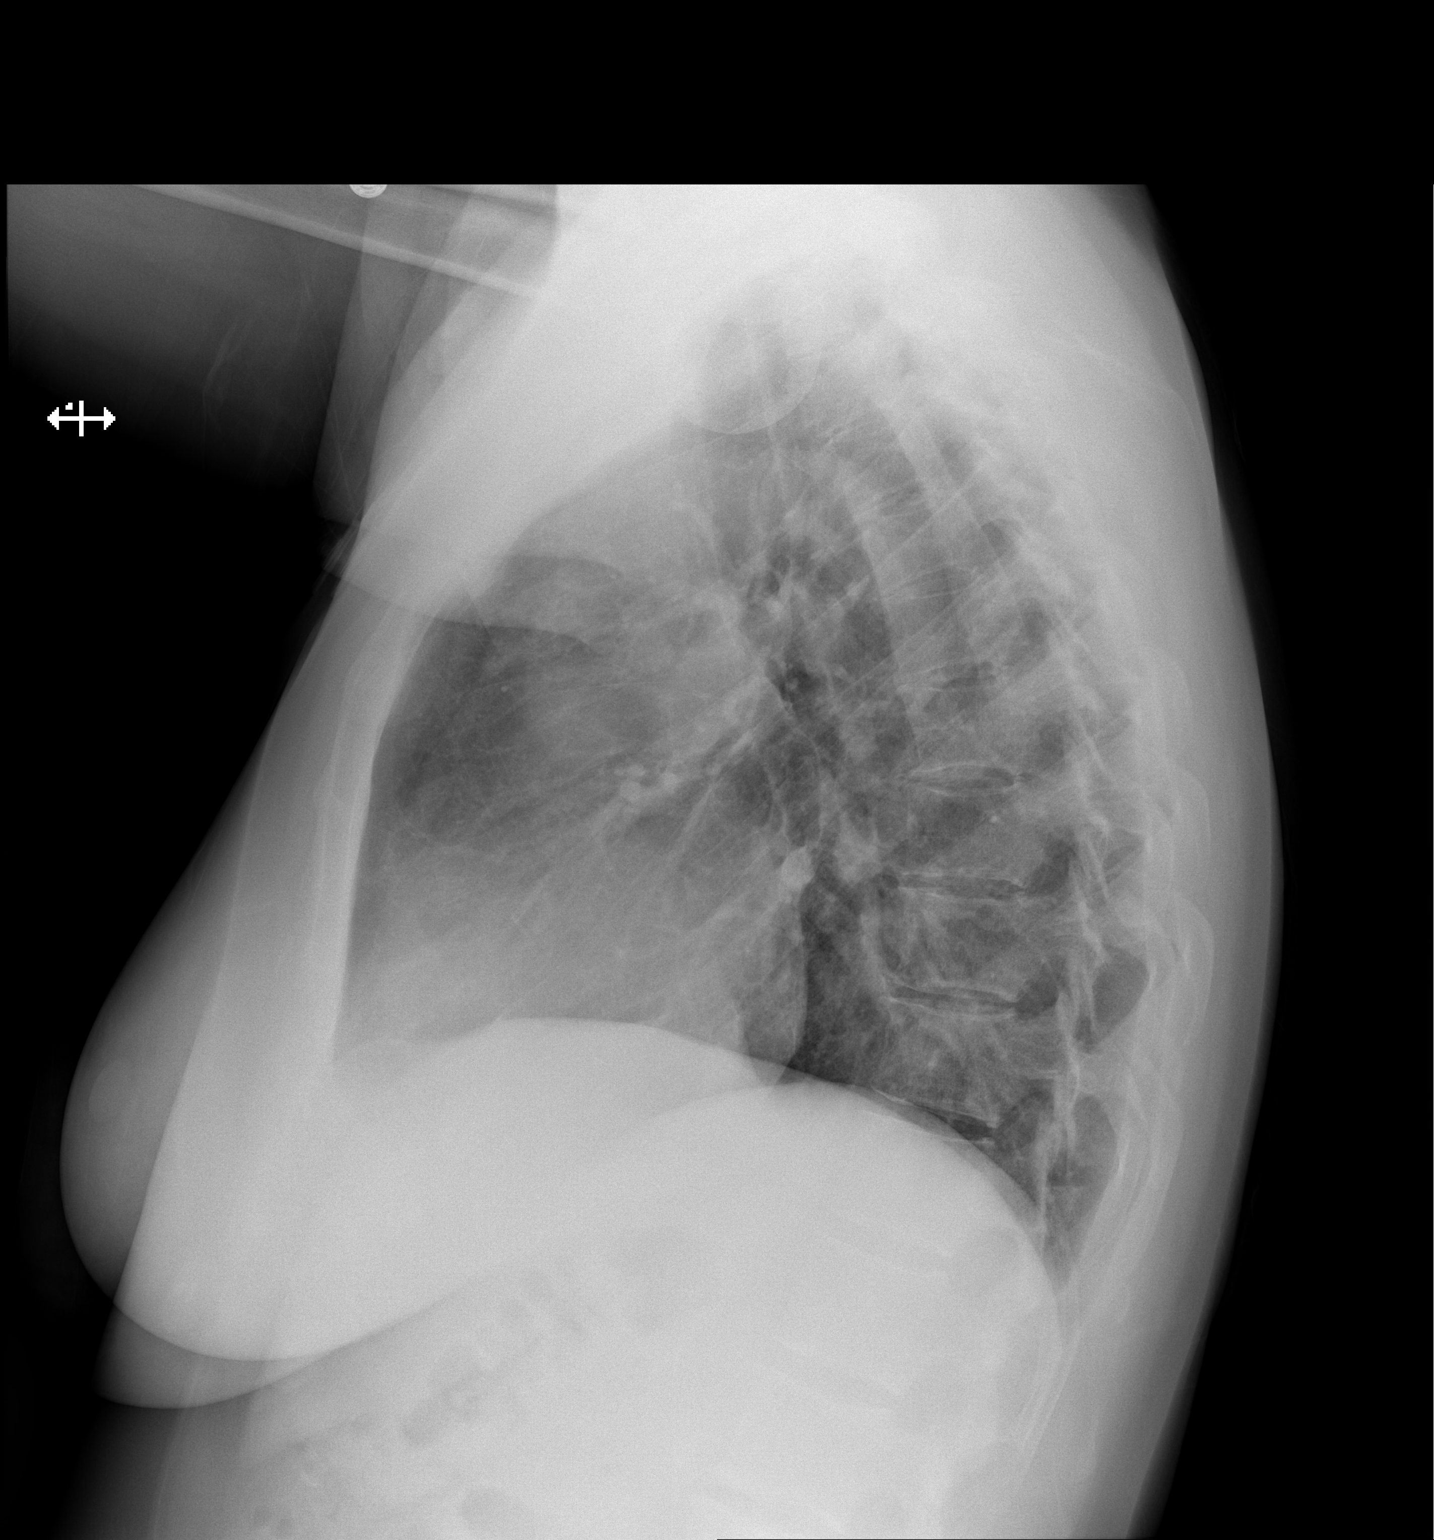

[2 of 2 positions shown; findings below may reference images not displayed]

FINDINGS: The cardiac and mediastinal silhouettes are within normal limits.

The lungs are normally inflated. No airspace consolidation, pleural
effusion, or pulmonary edema is identified. There is no
pneumothorax.

No acute osseous abnormality identified.
IMPRESSION: No active cardiopulmonary disease.

## 2018-02-04 DIAGNOSIS — Z1231 Encounter for screening mammogram for malignant neoplasm of breast: Secondary | ICD-10-CM | POA: Diagnosis not present

## 2018-02-23 DIAGNOSIS — M150 Primary generalized (osteo)arthritis: Secondary | ICD-10-CM | POA: Diagnosis not present

## 2018-02-23 DIAGNOSIS — M7662 Achilles tendinitis, left leg: Secondary | ICD-10-CM | POA: Diagnosis not present

## 2018-02-23 DIAGNOSIS — Z1589 Genetic susceptibility to other disease: Secondary | ICD-10-CM | POA: Diagnosis not present

## 2018-02-23 DIAGNOSIS — Z79899 Other long term (current) drug therapy: Secondary | ICD-10-CM | POA: Diagnosis not present

## 2018-05-19 DIAGNOSIS — Z23 Encounter for immunization: Secondary | ICD-10-CM | POA: Diagnosis not present

## 2018-10-17 DIAGNOSIS — J069 Acute upper respiratory infection, unspecified: Secondary | ICD-10-CM | POA: Diagnosis not present

## 2019-01-04 DIAGNOSIS — Z124 Encounter for screening for malignant neoplasm of cervix: Secondary | ICD-10-CM | POA: Diagnosis not present

## 2019-01-04 DIAGNOSIS — Z01419 Encounter for gynecological examination (general) (routine) without abnormal findings: Secondary | ICD-10-CM | POA: Diagnosis not present

## 2019-01-04 DIAGNOSIS — Z6837 Body mass index (BMI) 37.0-37.9, adult: Secondary | ICD-10-CM | POA: Diagnosis not present

## 2019-02-21 DIAGNOSIS — Z803 Family history of malignant neoplasm of breast: Secondary | ICD-10-CM | POA: Diagnosis not present

## 2019-02-21 DIAGNOSIS — Z1231 Encounter for screening mammogram for malignant neoplasm of breast: Secondary | ICD-10-CM | POA: Diagnosis not present

## 2019-02-24 DIAGNOSIS — M150 Primary generalized (osteo)arthritis: Secondary | ICD-10-CM | POA: Diagnosis not present

## 2019-02-24 DIAGNOSIS — M2550 Pain in unspecified joint: Secondary | ICD-10-CM | POA: Diagnosis not present

## 2019-02-24 DIAGNOSIS — Z1589 Genetic susceptibility to other disease: Secondary | ICD-10-CM | POA: Diagnosis not present

## 2019-02-24 DIAGNOSIS — M7662 Achilles tendinitis, left leg: Secondary | ICD-10-CM | POA: Diagnosis not present

## 2019-04-03 DIAGNOSIS — Z20828 Contact with and (suspected) exposure to other viral communicable diseases: Secondary | ICD-10-CM | POA: Diagnosis not present

## 2019-05-09 DIAGNOSIS — Z23 Encounter for immunization: Secondary | ICD-10-CM | POA: Diagnosis not present

## 2019-06-21 DIAGNOSIS — M79671 Pain in right foot: Secondary | ICD-10-CM | POA: Diagnosis not present

## 2019-07-05 DIAGNOSIS — M79671 Pain in right foot: Secondary | ICD-10-CM | POA: Diagnosis not present

## 2019-08-01 DIAGNOSIS — M545 Low back pain: Secondary | ICD-10-CM | POA: Diagnosis not present

## 2019-08-01 DIAGNOSIS — M79671 Pain in right foot: Secondary | ICD-10-CM | POA: Diagnosis not present

## 2019-11-18 ENCOUNTER — Other Ambulatory Visit: Payer: Self-pay

## 2019-11-18 ENCOUNTER — Ambulatory Visit (HOSPITAL_BASED_OUTPATIENT_CLINIC_OR_DEPARTMENT_OTHER)
Admit: 2019-11-18 | Discharge: 2019-11-18 | Disposition: A | Discharge status: Home / Self Care | Payer: BC Managed Care – PPO

## 2019-11-18 ENCOUNTER — Ambulatory Visit (HOSPITAL_COMMUNITY)
Admission: EM | Admit: 2019-11-18 | Discharge: 2019-11-18 | Disposition: A | Discharge status: Home / Self Care | Payer: BC Managed Care – PPO | Attending: Emergency Medicine | Admitting: Emergency Medicine

## 2019-11-18 ENCOUNTER — Encounter (HOSPITAL_COMMUNITY): Payer: Self-pay

## 2019-11-18 DIAGNOSIS — M79604 Pain in right leg: Secondary | ICD-10-CM

## 2019-11-18 NOTE — ED Provider Notes (Signed)
[place]    CSN: [id number] Arrival date & time: 11/18/19  1009      History   Chief Complaint Chief Complaint  Patient presents with  . Leg Pain    HPI Kimberly Kelly is a 49 y.o. female history of arthritis presenting today for evaluation of right calf pain.  Patient states that she has had pain in her calf for approximately 4 days.  She has felt a knot in this area with occasional twitching.  She has had increased pain with weightbearing.  She denies any injury or fall.  She does report that she recently started taking oral contraceptives approximately 3 weeks ago-low Loestrin.  She also reports approximately 3 weeks ago she had steroid injection into right knee.  Denies prior DVT/PE.  Denies tobacco use.  Denies recent travel/immobilization.  Has not noticed significant swelling or redness to area.  Reports that she has a lot of MSK/arthritic issues.  HPI  Past Medical History:  Diagnosis Date  . Arthritis     Patient Active Problem List   Diagnosis Date Noted  . Symptomatic cholelithiasis 10/10/2016    Past Surgical History:  Procedure Laterality Date  . CESAREAN SECTION    . CHOLECYSTECTOMY N/A 10/10/2016   Procedure: LAPAROSCOPIC CHOLECYSTECTOMY WITH INTRAOPERATIVE CHOLANGIOGRAM;  Surgeon: Erroll Luna, MD;  Location: [facility];  Service: General;  Laterality: N/A;  . SHOULDER ARTHROSCOPY    . TUBAL LIGATION      OB History   No obstetric history on file.      Home Medications    Prior to Admission medications   Medication Sig Start Date End Date Taking? Authorizing Provider  celecoxib (CELEBREX) 200 MG capsule Take 200 mg by mouth daily as needed for mild pain.  08/13/16   [provider]  oxyCODONE-acetaminophen (PERCOCET/ROXICET) 5-325 MG tablet Take 1 tablet by mouth every 4 (four) hours as needed for moderate pain. 10/11/16   Fanny Skates, MD  predniSONE (STERAPRED UNI-PAK 21 TAB) 10 MG (21) TBPK tablet Take 1 tablet (10 mg total) by  mouth daily. Take 6 tabs by mouth daily  for 2 days, then 5 tabs for 2 days, then 4 tabs for 2 days, then 3 tabs for 2 days, 2 tabs for 2 days, then 1 tab by mouth daily for 2 days Patient not taking: Reported on 10/10/2016 04/23/15   Delsa Grana, PA-C    Family History Family History  Problem Relation Age of Onset  . Breast cancer Paternal Grandmother     Social History Social History   Tobacco Use  . Smoking status: Never Smoker  . Smokeless tobacco: Never Used  Substance Use Topics  . Alcohol use: Yes    Alcohol/week: 7.0 standard drinks    Types: 7 Glasses of wine per week  . Drug use: No     Allergies   Patient has no known allergies.   Review of Systems Review of Systems  Constitutional: Negative for fatigue and fever.  Eyes: Negative for visual disturbance.  Respiratory: Negative for shortness of breath.   Cardiovascular: Negative for chest pain.  Gastrointestinal: Negative for abdominal pain, nausea and vomiting.  Musculoskeletal: Positive for gait problem and myalgias. Negative for arthralgias and joint swelling.  Skin: Negative for color change, rash and wound.  Neurological: Negative for dizziness, weakness, light-headedness and headaches.     Physical Exam Triage Vital Signs ED Triage Vitals  Enc Vitals Group     BP 11/18/19 1031 136/86  Pulse Rate 11/18/19 1031 78     Resp 11/18/19 1031 16     Temp 11/18/19 1031 97.9 F (36.6 C)     Temp Source 11/18/19 1031 Oral     SpO2 11/18/19 1031 99 %     Weight --      Height --      Head Circumference --      Peak Flow --      Pain Score 11/18/19 1028 5     Pain Loc --      Pain Edu? --      Excl. in [place]? --    No data found.  Updated Vital Signs BP 136/86 (BP Location: Right Arm)   Pulse 78   Temp 97.9 F (36.6 C) (Oral)   Resp 16   SpO2 99%   Visual Acuity Right Eye Distance:   Left Eye Distance:   Bilateral Distance:    Right Eye Near:   Left Eye Near:    Bilateral Near:      Physical Exam Vitals and nursing note reviewed.  Constitutional:      Appearance: She is well-developed.     Comments: No acute distress  HENT:     Head: Normocephalic and atraumatic.     Nose: Nose normal.  Eyes:     Conjunctiva/sclera: Conjunctivae normal.  Cardiovascular:     Rate and Rhythm: Normal rate.  Pulmonary:     Effort: Pulmonary effort is normal. No respiratory distress.  Abdominal:     General: There is no distension.  Musculoskeletal:        General: Normal range of motion.     Cervical back: Neck supple.     Comments: Right lower leg with tenderness to palpation and belly of calf, does appear to have a palpable knot, no overlying erythema, calfs measuring 44 cm bilateral laterally  Full active range of motion at knee, mild popliteal tenderness  Skin:    General: Skin is warm and dry.  Neurological:     Mental Status: She is alert and oriented to person, place, and time.      UC Treatments / Results  Labs (all labs ordered are listed, but only abnormal results are displayed) Labs Reviewed - No data to display  EKG   Radiology VAS [place] LOWER EXTREMITY VENOUS (DVT)  Result Date: 11/18/2019  Lower Venous DVTStudy Indications: Pain, and Palpable Cord.  Comparison Study: no prior Performing Technologist: June Leap RDMS, RVT  Examination Guidelines: A complete evaluation includes B-mode imaging, spectral Doppler, color Doppler, and power Doppler as needed of all accessible portions of each vessel. Bilateral testing is considered an integral part of a complete examination. Limited examinations for reoccurring indications may be performed as noted. The reflux portion of the exam is performed with the patient in reverse Trendelenburg.  +---------+---------------+---------+-----------+----------+--------------+ RIGHT    CompressibilityPhasicitySpontaneityPropertiesThrombus Aging +---------+---------------+---------+-----------+----------+--------------+ CFV       Full           Yes      Yes                                 +---------+---------------+---------+-----------+----------+--------------+ SFJ      Full                                                        +---------+---------------+---------+-----------+----------+--------------+  FV Prox  Full                                                        +---------+---------------+---------+-----------+----------+--------------+ FV Mid   Full                                                        +---------+---------------+---------+-----------+----------+--------------+ FV DistalFull                                                        +---------+---------------+---------+-----------+----------+--------------+ PFV      Full                                                        +---------+---------------+---------+-----------+----------+--------------+ POP      Full           Yes      Yes                                 +---------+---------------+---------+-----------+----------+--------------+ PTV      Full                                                        +---------+---------------+---------+-----------+----------+--------------+ PERO     Full                                                        +---------+---------------+---------+-----------+----------+--------------+   +----+---------------+---------+-----------+----------+--------------+ LEFTCompressibilityPhasicitySpontaneityPropertiesThrombus Aging +----+---------------+---------+-----------+----------+--------------+ CFV Full           Yes      Yes                                 +----+---------------+---------+-----------+----------+--------------+     Summary: RIGHT: - There is no evidence of deep vein thrombosis in the lower extremity. - There is no evidence of superficial venous thrombosis.  - No cystic structure found in the popliteal fossa.  LEFT: - No evidence of common  femoral vein obstruction.  *See table(s) above for measurements and observations.    Preliminary     Procedures Procedures (including critical care time)  Medications Ordered in UC Medications - No data to display  Initial Impression / Assessment and Plan / UC Course  I have reviewed the triage vital signs and the nursing notes.  Pertinent labs & imaging results that were available during my care of the  patient were reviewed by me and considered in my medical decision making (see chart for details).     Given patient recently starting birth control with unilateral calf pain obtain DVT study to rule out DVT.  Study was negative, no sign of Baker's cyst.  Will treat as MSK cause, patient will proceed with using NSAIDs and muscle relaxers prescribed to her by her rheumatologist.  Discussed strict return precautions. Patient verbalized understanding and is agreeable with plan.  Final Clinical Impressions(s) / UC Diagnoses   Final diagnoses:  Right leg pain     Discharge Instructions     Please go to entrance of ED and they will come get you for ultrasound   ED Prescriptions    None     PDMP not reviewed this encounter.   Janith Lima, PA-C 11/18/19 1357

## 2019-11-18 NOTE — Progress Notes (Signed)
Lower venous duplex       has been completed. Preliminary results can be found under CV proc through chart review. June Leap, BS, RDMS, RVT

## 2019-11-18 NOTE — Discharge Instructions (Signed)
Please go to entrance of ED and they will come get you for ultrasound

## 2019-11-18 NOTE — ED Triage Notes (Signed)
Pt present right leg/Calf pain. Symptoms started four days ago with muscle twitching and not able to bear weight. Pt also receive injection in her right leg.

## 2020-08-03 DIAGNOSIS — K8591 Acute pancreatitis with uninfected necrosis, unspecified: Secondary | ICD-10-CM

## 2020-08-03 HISTORY — DX: Acute pancreatitis with uninfected necrosis, unspecified: K85.91

## 2020-09-12 ENCOUNTER — Ambulatory Visit: Payer: BC Managed Care – PPO | Admitting: Pulmonary Disease

## 2020-09-12 ENCOUNTER — Other Ambulatory Visit: Payer: Self-pay

## 2020-09-12 ENCOUNTER — Encounter: Payer: Self-pay | Admitting: Pulmonary Disease

## 2020-09-12 VITALS — BP 138/88 | HR 110 | Temp 97.9°F | Ht 67.0 in | Wt 199.4 lb

## 2020-09-12 DIAGNOSIS — R918 Other nonspecific abnormal finding of lung field: Secondary | ICD-10-CM

## 2020-09-12 DIAGNOSIS — R911 Solitary pulmonary nodule: Secondary | ICD-10-CM | POA: Diagnosis not present

## 2020-09-12 DIAGNOSIS — Z789 Other specified health status: Secondary | ICD-10-CM | POA: Diagnosis not present

## 2020-09-12 NOTE — Patient Instructions (Signed)
Thank you for visiting Dr. Valeta Harms at [facility]. Today we recommend the following: Orders Placed This Encounter  Procedures  . CT Chest Wo Contrast   Return after CT imaging is complete.   Return in about 10 weeks (around 11/21/2020) for with APP or Dr. Valeta Harms.    Please do your part to reduce the spread of COVID-19.

## 2020-09-12 NOTE — Progress Notes (Signed)
Synopsis: Referred in February 2022 for lung nodule, groundglass opacity by [name], FNP  Subjective:   PATIENT ID: Kimberly Kelly GENDER: female DOB: [birth year], MRN: [id number]  Chief Complaint  Patient presents with  . Consult    Abnormal chest CT.  Lung nodule    This is a 50 year old female, past medical history of arthritis, and lifelong non-smoker, paternal grandfather with lung cancer however he was a smoker.  Patient had CT scan imaging that was completed at [facility].  CT scan report is available for review in epic care everywhere.  CT scan was completed on 08/01/2020 which revealed a nodular groundglass focus within the right lower lobe recommending CT follow-up she has known cystic disease within the breast that has routine mammograms.  From a respiratory standpoint patient has no complaints.  Patient's child goes to the [employer] school here in [place] and she knows Dr. Graylon Good from [place] who made recommendations for referral to see me here in clinic for follow-up of the abnormalities seen in the chest.    Past Medical History:  Diagnosis Date  . Arthritis      Family History  Problem Relation Age of Onset  . Breast cancer Paternal Grandmother      Past Surgical History:  Procedure Laterality Date  . CESAREAN SECTION    . CHOLECYSTECTOMY N/A 10/10/2016   Procedure: LAPAROSCOPIC CHOLECYSTECTOMY WITH INTRAOPERATIVE CHOLANGIOGRAM;  Surgeon: Erroll Luna, MD;  Location: [facility];  Service: General;  Laterality: N/A;  . SHOULDER ARTHROSCOPY    . TUBAL LIGATION      Social History   Socioeconomic History  . Marital status: Married    Spouse name: Not on file  . Number of children: Not on file  . Years of education: Not on file  . Highest education level: Not on file  Occupational History  . Not on file  Tobacco Use  . Smoking status: Never Smoker  . Smokeless tobacco: Never Used  Substance and Sexual Activity  . Alcohol use: Yes    Alcohol/week:  7.0 standard drinks    Types: 7 Glasses of wine per week  . Drug use: No  . Sexual activity: Not on file  Other Topics Concern  . Not on file  Social History Narrative  . Not on file   Social Determinants of Health   Financial Resource Strain: Not on file  Food Insecurity: Not on file  Transportation Needs: Not on file  Physical Activity: Not on file  Stress: Not on file  Social Connections: Not on file  Intimate Partner Violence: Not on file     No Known Allergies   Outpatient Medications Prior to Visit  Medication Sig Dispense Refill  . diclofenac (VOLTAREN) 75 MG EC tablet Take by mouth.    . escitalopram (LEXAPRO) 10 MG tablet Take by mouth.    . traMADol (ULTRAM) 50 MG tablet Take by mouth.    . celecoxib (CELEBREX) 200 MG capsule Take 200 mg by mouth daily as needed for mild pain.  (Patient not taking: Reported on 09/12/2020)  5  . oxyCODONE-acetaminophen (PERCOCET/ROXICET) 5-325 MG tablet Take 1 tablet by mouth every 4 (four) hours as needed for moderate pain. (Patient not taking: Reported on 09/12/2020) 30 tablet 0  . predniSONE (STERAPRED UNI-PAK 21 TAB) 10 MG (21) TBPK tablet Take 1 tablet (10 mg total) by mouth daily. Take 6 tabs by mouth daily  for 2 days, then 5 tabs for 2 days, then 4 tabs for 2  days, then 3 tabs for 2 days, 2 tabs for 2 days, then 1 tab by mouth daily for 2 days (Patient not taking: No sig reported) 42 tablet 0   No facility-administered medications prior to visit.    Review of Systems  Constitutional: Negative for chills, fever, malaise/fatigue and weight loss.  HENT: Negative for hearing loss, sore throat and tinnitus.   Eyes: Negative for blurred vision and double vision.  Respiratory: Negative for cough, hemoptysis, sputum production, shortness of breath, wheezing and stridor.   Cardiovascular: Negative for chest pain, palpitations, orthopnea, leg swelling and PND.  Gastrointestinal: Negative for abdominal pain, constipation, diarrhea,  heartburn, nausea and vomiting.  Genitourinary: Negative for dysuria, hematuria and urgency.  Musculoskeletal: Negative for joint pain and myalgias.  Skin: Negative for itching and rash.  Neurological: Negative for dizziness, tingling, weakness and headaches.  Endo/Heme/Allergies: Negative for environmental allergies. Does not bruise/bleed easily.  Psychiatric/Behavioral: Negative for depression. The patient is not nervous/anxious and does not have insomnia.   All other systems reviewed and are negative.    Objective:  Physical Exam Vitals reviewed.  Constitutional:      General: She is not in acute distress.    Appearance: She is well-developed and well-nourished.  HENT:     Head: Normocephalic and atraumatic.     Mouth/Throat:     Mouth: Oropharynx is clear and moist.  Eyes:     General: No scleral icterus.    Conjunctiva/sclera: Conjunctivae normal.     Pupils: Pupils are equal, round, and reactive to light.  Neck:     Vascular: No JVD.     Trachea: No tracheal deviation.  Cardiovascular:     Rate and Rhythm: Normal rate and regular rhythm.     Pulses: Intact distal pulses.     Heart sounds: Normal heart sounds. No murmur heard.   Pulmonary:     Effort: Pulmonary effort is normal. No tachypnea, accessory muscle usage or respiratory distress.     Breath sounds: Normal breath sounds. No stridor. No wheezing, rhonchi or rales.  Abdominal:     General: Bowel sounds are normal. There is no distension.     Palpations: Abdomen is soft.     Tenderness: There is no abdominal tenderness.  Musculoskeletal:        General: No tenderness or edema.     Cervical back: Neck supple.  Lymphadenopathy:     Cervical: No cervical adenopathy.  Skin:    General: Skin is warm and dry.     Capillary Refill: Capillary refill takes less than 2 seconds.     Findings: No rash.  Neurological:     Mental Status: She is alert and oriented to person, place, and time.  Psychiatric:        Mood  and Affect: Mood and affect normal.        Behavior: Behavior normal.      Vitals:   09/12/20 0947  BP: 138/88  Pulse: (!) 110  Temp: 97.9 F (36.6 C)  TempSrc: Tympanic  SpO2: 97%  Weight: 199 lb 6 oz (90.4 kg)  Height: 5\' 7"  (1.702 m)   97% on RA BMI Readings from Last 3 Encounters:  09/12/20 31.23 kg/m  10/10/16 29.65 kg/m   Wt Readings from Last 3 Encounters:  09/12/20 199 lb 6 oz (90.4 kg)  10/10/16 195 lb (88.5 kg)     CBC    Component Value Date/Time   WBC 10.1 10/10/2016 0622   RBC 4.78 10/10/2016  0622   HGB 13.9 10/10/2016 0622   HCT 41.4 10/10/2016 0622   PLT 390 10/10/2016 0622   MCV 86.6 10/10/2016 0622   MCH 29.1 10/10/2016 0622   MCHC 33.6 10/10/2016 0622   RDW 13.4 10/10/2016 0622   LYMPHSABS 1.1 10/10/2016 0622   MONOABS 0.3 10/10/2016 0622   EOSABS 0.1 10/10/2016 0622   BASOSABS 0.1 10/10/2016 0622    Chest Imaging: CT chest 08/01/2020: 14 mm groundglass nodule within the right lower lobe. The patient's images have been independently reviewed by me.    Pulmonary Functions Testing Results: No flowsheet data found.  FeNO:   Pathology:   Echocardiogram:   Heart Catheterization:     Assessment & Plan:     ICD-10-CM   1. Lung nodule  R91.1 CT Chest Wo Contrast  2. Ground glass opacity present on imaging of lung  R91.8 CT Chest Wo Contrast  3. Non-smoker  Z78.9   4. Abnormal CT scan, lung  R91.8     Discussion:  50 year old female, no significant past medical history.  Incidentally found lung nodule, 14 mm right lower lobe groundglass nodule.  Plan: Today in the office we reviewed image report.  Discussed probabilities and risk calculations based on the description of the nodule of this being a malignancy. We will plan for repeat noncontrasted CT imaging in April 2022.    Current Outpatient Medications:  .  diclofenac (VOLTAREN) 75 MG EC tablet, Take by mouth., Disp: , Rfl:  .  escitalopram (LEXAPRO) 10 MG tablet, Take by  mouth., Disp: , Rfl:  .  traMADol (ULTRAM) 50 MG tablet, Take by mouth., Disp: , Rfl:   I spent 45 minutes dedicated to the care of this patient on the date of this encounter to include pre-visit review of records, face-to-face time with the patient discussing conditions above, post visit ordering of testing, clinical documentation with the electronic health record, making appropriate referrals as documented, and communicating necessary findings to members of the patients care team.   Garner Nash, DO [facility] 09/12/2020 10:01 AM

## 2020-11-22 ENCOUNTER — Ambulatory Visit (INDEPENDENT_AMBULATORY_CARE_PROVIDER_SITE_OTHER)
Admission: RE | Admit: 2020-11-22 | Discharge: 2020-11-22 | Disposition: A | Discharge status: Home / Self Care | Payer: BC Managed Care – PPO | Source: Ambulatory Visit | Attending: Pulmonary Disease | Admitting: Pulmonary Disease

## 2020-11-22 ENCOUNTER — Other Ambulatory Visit: Payer: Self-pay

## 2020-11-22 DIAGNOSIS — R911 Solitary pulmonary nodule: Secondary | ICD-10-CM

## 2020-11-22 DIAGNOSIS — R918 Other nonspecific abnormal finding of lung field: Secondary | ICD-10-CM

## 2020-12-04 ENCOUNTER — Ambulatory Visit: Payer: BC Managed Care – PPO | Admitting: Pulmonary Disease

## 2020-12-04 ENCOUNTER — Encounter: Payer: Self-pay | Admitting: Pulmonary Disease

## 2020-12-04 ENCOUNTER — Other Ambulatory Visit: Payer: Self-pay

## 2020-12-04 VITALS — BP 126/78 | HR 78 | Temp 97.0°F | Ht 67.0 in | Wt 195.6 lb

## 2020-12-04 DIAGNOSIS — R918 Other nonspecific abnormal finding of lung field: Secondary | ICD-10-CM | POA: Diagnosis not present

## 2020-12-04 DIAGNOSIS — Z789 Other specified health status: Secondary | ICD-10-CM | POA: Diagnosis not present

## 2020-12-04 DIAGNOSIS — R911 Solitary pulmonary nodule: Secondary | ICD-10-CM | POA: Diagnosis not present

## 2020-12-04 NOTE — Patient Instructions (Addendum)
Thank you for visiting Dr. Valeta Harms at [facility]. Today we recommend the following:  Orders Placed This Encounter  Procedures  . CT Super D Chest Wo Contrast  . Ambulatory referral to Cardiothoracic Surgery   Return in about 4 months (around 04/06/2021) for with APP or Dr. Valeta Harms.    Please do your part to reduce the spread of COVID-19.

## 2020-12-04 NOTE — Progress Notes (Signed)
Synopsis: Referred in February 2022 for lung nodule, groundglass opacity by No ref. provider found  Subjective:   PATIENT ID: Kimberly Kelly GENDER: female DOB: [birth year], MRN: [id number]  Chief Complaint  Patient presents with  . Follow-up    Pt is here today to discuss recent CT results. Pt states she has been doing fine since last visit.    This is a 50 year old female, past medical history of arthritis, and lifelong non-smoker, paternal grandfather with lung cancer however he was a smoker.  Patient had CT scan imaging that was completed at [facility]  CT scan report is available for review in epic care everywhere.  CT scan was completed on 08/01/2020 which revealed a nodular groundglass focus within the right lower lobe recommending CT follow-up she has known cystic disease within the breast that has routine mammograms.  From a respiratory standpoint patient has no complaints.  Patient's child goes to the [employer] school here in [place] and she knows Dr. Graylon Good from [place] who made recommendations for referral to see me here in clinic for follow-up of the abnormalities seen in the chest.  OV 12/04/2020: Patient here today for CT follow-up completed on 11/22/2020 patient has a persistent right lower lobe groundglass opacity 1.3 cm in size.  Also noted to have several cystic masses within the right breast on CT imaging. She has no respiratory complaints at this time. She is anxious about the persistence of the nodule.     Past Medical History:  Diagnosis Date  . Arthritis      Family History  Problem Relation Age of Onset  . Breast cancer Paternal Grandmother      Past Surgical History:  Procedure Laterality Date  . CESAREAN SECTION    . CHOLECYSTECTOMY N/A 10/10/2016   Procedure: LAPAROSCOPIC CHOLECYSTECTOMY WITH INTRAOPERATIVE CHOLANGIOGRAM;  Surgeon: Erroll Luna, MD;  Location: [facility];  Service: General;  Laterality: N/A;  . SHOULDER ARTHROSCOPY    . TUBAL LIGATION       Social History   Socioeconomic History  . Marital status: Married    Spouse name: Not on file  . Number of children: Not on file  . Years of education: Not on file  . Highest education level: Not on file  Occupational History  . Not on file  Tobacco Use  . Smoking status: Never Smoker  . Smokeless tobacco: Never Used  Substance and Sexual Activity  . Alcohol use: Yes    Alcohol/week: 7.0 standard drinks    Types: 7 Glasses of wine per week  . Drug use: No  . Sexual activity: Not on file  Other Topics Concern  . Not on file  Social History Narrative  . Not on file   Social Determinants of Health   Financial Resource Strain: Not on file  Food Insecurity: Not on file  Transportation Needs: Not on file  Physical Activity: Not on file  Stress: Not on file  Social Connections: Not on file  Intimate Partner Violence: Not on file     No Known Allergies   Outpatient Medications Prior to Visit  Medication Sig Dispense Refill  . escitalopram (LEXAPRO) 10 MG tablet Take by mouth.    . traMADol (ULTRAM) 50 MG tablet Take 50 mg by mouth daily as needed.    . diclofenac (VOLTAREN) 75 MG EC tablet Take by mouth.     No facility-administered medications prior to visit.    Review of Systems  Constitutional: Negative for chills, fever, malaise/fatigue and  weight loss.  HENT: Negative for hearing loss, sore throat and tinnitus.   Eyes: Negative for blurred vision and double vision.  Respiratory: Negative for cough, hemoptysis, sputum production, shortness of breath, wheezing and stridor.   Cardiovascular: Negative for chest pain, palpitations, orthopnea, leg swelling and PND.  Gastrointestinal: Negative for abdominal pain, constipation, diarrhea, heartburn, nausea and vomiting.  Genitourinary: Negative for dysuria, hematuria and urgency.  Musculoskeletal: Negative for joint pain and myalgias.  Skin: Negative for itching and rash.  Neurological: Negative for dizziness,  tingling, weakness and headaches.  Endo/Heme/Allergies: Negative for environmental allergies. Does not bruise/bleed easily.  Psychiatric/Behavioral: Negative for depression. The patient is not nervous/anxious and does not have insomnia.   All other systems reviewed and are negative.    Objective:  Physical Exam Vitals reviewed.  Constitutional:      General: She is not in acute distress.    Appearance: She is well-developed.  HENT:     Head: Normocephalic and atraumatic.  Eyes:     General: No scleral icterus.    Conjunctiva/sclera: Conjunctivae normal.     Pupils: Pupils are equal, round, and reactive to light.  Neck:     Vascular: No JVD.     Trachea: No tracheal deviation.  Cardiovascular:     Rate and Rhythm: Normal rate and regular rhythm.     Heart sounds: Normal heart sounds. No murmur heard.   Pulmonary:     Effort: Pulmonary effort is normal. No tachypnea, accessory muscle usage or respiratory distress.     Breath sounds: Normal breath sounds. No stridor. No wheezing, rhonchi or rales.  Abdominal:     General: Bowel sounds are normal. There is no distension.     Palpations: Abdomen is soft.     Tenderness: There is no abdominal tenderness.  Musculoskeletal:        General: No tenderness.     Cervical back: Neck supple.  Lymphadenopathy:     Cervical: No cervical adenopathy.  Skin:    General: Skin is warm and dry.     Capillary Refill: Capillary refill takes less than 2 seconds.     Findings: No rash.  Neurological:     Mental Status: She is alert and oriented to person, place, and time.  Psychiatric:        Behavior: Behavior normal.      Vitals:   12/04/20 0912  BP: 126/78  Pulse: 78  Temp: (!) 97 F (36.1 C)  TempSrc: Temporal  SpO2: 100%  Weight: 195 lb 9.6 oz (88.7 kg)  Height: 5\' 7"  (1.702 m)   100% on RA BMI Readings from Last 3 Encounters:  12/04/20 30.64 kg/m  09/12/20 31.23 kg/m  10/10/16 29.65 kg/m   Wt Readings from Last 3  Encounters:  12/04/20 195 lb 9.6 oz (88.7 kg)  09/12/20 199 lb 6 oz (90.4 kg)  10/10/16 195 lb (88.5 kg)     CBC    Component Value Date/Time   WBC 10.1 10/10/2016 0622   RBC 4.78 10/10/2016 0622   HGB 13.9 10/10/2016 0622   HCT 41.4 10/10/2016 0622   PLT 390 10/10/2016 0622   MCV 86.6 10/10/2016 0622   MCH 29.1 10/10/2016 0622   MCHC 33.6 10/10/2016 0622   RDW 13.4 10/10/2016 0622   LYMPHSABS 1.1 10/10/2016 0622   MONOABS 0.3 10/10/2016 0622   EOSABS 0.1 10/10/2016 0622   BASOSABS 0.1 10/10/2016 0622    Chest Imaging: CT chest 08/01/2020: 14 mm groundglass nodule within the  right lower lobe. The patient's images have been independently reviewed by me.    Pulmonary Functions Testing Results: No flowsheet data found.  FeNO:   Pathology:   Echocardiogram:   Heart Catheterization:     Assessment & Plan:     ICD-10-CM   1. Lung nodule  R91.1   2. Ground glass opacity present on imaging of lung  R91.8   3. Non-smoker  Z78.9   4. Abnormal CT scan, lung  R91.8     Discussion:  This is a 50 year old female, no significant past medical history, incidental lung nodule that was found at 14 mm.  She has a right lower lobe persistent groundglass opacity.  Repeat imaging shows persistence of this GGO.  Plan: I think that the persistence of the nodule lends itself for potential of being an underlying malignancy such as an adenocarcinoma. She is anxious about this and concerned about the next steps. We discussed various neck steps to include watchful waiting with repeat imaging within the next 3-6 months We also could consider tissue biopsy with navigational bronchoscopy or consideration of a staged case with myself and Dr. Kipp Brood from thoracic surgery. I think the next steps would be best for her to meet Dr. Kipp Brood. We will also obtain pulmonary function test We will also order a repeat 26-month noncontrasted CT.   Current Outpatient Medications:  .   escitalopram (LEXAPRO) 10 MG tablet, Take by mouth., Disp: , Rfl:  .  traMADol (ULTRAM) 50 MG tablet, Take 50 mg by mouth daily as needed., Disp: , Rfl:    Garner Nash, DO [facility] 12/04/2020 9:31 AM

## 2020-12-24 ENCOUNTER — Other Ambulatory Visit (HOSPITAL_COMMUNITY)
Admission: RE | Admit: 2020-12-24 | Discharge: 2020-12-24 | Disposition: A | Discharge status: Home / Self Care | Payer: BC Managed Care – PPO | Source: Ambulatory Visit | Attending: Pulmonary Disease | Admitting: Pulmonary Disease

## 2020-12-24 DIAGNOSIS — Z01812 Encounter for preprocedural laboratory examination: Secondary | ICD-10-CM | POA: Insufficient documentation

## 2020-12-24 DIAGNOSIS — Z20822 Contact with and (suspected) exposure to covid-19: Secondary | ICD-10-CM | POA: Insufficient documentation

## 2020-12-24 LAB — SARS CORONAVIRUS 2 (TAT 6-24 HRS): SARS Coronavirus 2: NEGATIVE

## 2020-12-27 ENCOUNTER — Encounter: Payer: Self-pay | Admitting: Adult Health

## 2020-12-27 ENCOUNTER — Ambulatory Visit: Payer: BC Managed Care – PPO | Admitting: Adult Health

## 2020-12-27 ENCOUNTER — Ambulatory Visit (INDEPENDENT_AMBULATORY_CARE_PROVIDER_SITE_OTHER): Payer: BC Managed Care – PPO | Admitting: Pulmonary Disease

## 2020-12-27 ENCOUNTER — Other Ambulatory Visit: Payer: Self-pay

## 2020-12-27 DIAGNOSIS — R918 Other nonspecific abnormal finding of lung field: Secondary | ICD-10-CM

## 2020-12-27 DIAGNOSIS — R911 Solitary pulmonary nodule: Secondary | ICD-10-CM | POA: Insufficient documentation

## 2020-12-27 LAB — PULMONARY FUNCTION TEST
DL/VA % pred: 130 %
DL/VA: 5.51 ml/min/mmHg/L
DLCO cor % pred: 134 %
DLCO cor: 31.21 ml/min/mmHg
DLCO unc % pred: 134 %
DLCO unc: 31.21 ml/min/mmHg
FEF 25-75 Post: 2.88 L/sec
FEF 25-75 Pre: 2.54 L/sec
FEF2575-%Change-Post: 13 %
FEF2575-%Pred-Post: 97 %
FEF2575-%Pred-Pre: 86 %
FEV1-%Change-Post: 2 %
FEV1-%Pred-Post: 94 %
FEV1-%Pred-Pre: 91 %
FEV1-Post: 2.92 L
FEV1-Pre: 2.84 L
FEV1FVC-%Change-Post: 3 %
FEV1FVC-%Pred-Pre: 99 %
FEV6-%Change-Post: 0 %
FEV6-%Pred-Post: 92 %
FEV6-%Pred-Pre: 93 %
FEV6-Post: 3.53 L
FEV6-Pre: 3.55 L
FEV6FVC-%Pred-Post: 102 %
FEV6FVC-%Pred-Pre: 102 %
FVC-%Change-Post: 0 %
FVC-%Pred-Post: 90 %
FVC-%Pred-Pre: 90 %
FVC-Post: 3.53 L
FVC-Pre: 3.55 L
Post FEV1/FVC ratio: 83 %
Post FEV6/FVC ratio: 100 %
Pre FEV1/FVC ratio: 80 %
Pre FEV6/FVC Ratio: 100 %
RV % pred: 110 %
RV: 2.14 L
TLC % pred: 106 %
TLC: 5.84 L

## 2020-12-27 NOTE — Patient Instructions (Signed)
Continue with follow up with Dr. Kipp Brood next month as planned Follow up with Dr. Valeta Harms 6-8 weeks and As needed

## 2020-12-27 NOTE — Progress Notes (Signed)
@Patient  ID: Kimberly Kelly, female    DOB: September 12, 1970, 50 y.o.   MRN: [id number]  Chief Complaint  Patient presents with  . 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  12/27/2020 Follow up : Lung nodule Patient returns for a 3-week follow-up visit.  Patient was seen earlier this year for a lung nodule found on CT scan.  Patient was undergoing a GI work-up.  CT abdomen showed a left lower lobe nodule.  A dedicated CT chest done on August 01, 2020 showed a nodular groundglass focus in the right lower lobe and a 11 mm left lower lobe calcified nodule consistent with a granuloma.  Patient was referred to pulmonary for evaluation.  She is a never smoker.  Patient was set up for a repeat CT chest completed on November 22, 2020.  This showed a groundglass opacity measuring 1.3 cm in the right lower lobe.  Patient has been recommended for tissue sampling.  She was set up for pulmonary function testing.  She has no known history of lung disease asthma history.  Says her breathing has been fine she has no cough or shortness of breath.  Pulmonary function testing today showed normal lung function with no airflow obstruction or restriction.  FEV1 was 94%, ratio 83, FVC 90%, no significant bronchodilator response.  DLCO was elevated at 134%.   No Known Allergies  Immunization History  Administered Date(s) Administered  . Hepatitis A 03/31/2017  . Hepatitis A, Ped/Adol-2 Dose 03/31/2017  . Influenza Split 05/16/2013, 05/31/2014, 05/17/2015,  05/10/2017, 05/19/2018, 05/09/2019  . Influenza,inj,Quad PF,6+ Mos 05/19/2018  . Influenza,inj,Quad PF,6-35 Mos 05/02/2020  . Influenza,inj,quad, With Preservative 05/17/2015, 05/10/2017, 05/09/2019  . Influenza,trivalent, recombinat, inj, PF 05/16/2013, 05/31/2014  . Tdap 03/31/2017    Past Medical History:  Diagnosis Date  . Arthritis     Tobacco History: Social History   Tobacco Use  Smoking Status Never Smoker  Smokeless Tobacco Never Used   Counseling given: Not Answered   Outpatient Medications Prior to Visit  Medication Sig Dispense Refill  . escitalopram (LEXAPRO) 10 MG tablet Take by mouth.    . traMADol (ULTRAM) 50 MG tablet Take 50 mg by mouth daily as needed. (Patient not taking: Reported on 12/27/2020)     No facility-administered medications prior to visit.     Review of Systems:   Constitutional:   No  weight loss, night sweats,  Fevers, chills, fatigue, or  lassitude.  HEENT:   No headaches,  Difficulty swallowing,  Tooth/dental problems, or  Sore throat,                No sneezing, itching, ear ache, nasal congestion, post nasal drip,   CV:  No chest pain,  Orthopnea, PND, swelling in lower extremities, anasarca, dizziness, palpitations, syncope.   GI  No heartburn, indigestion, abdominal pain, nausea, vomiting, diarrhea, change in bowel habits, loss of appetite, bloody stools.   Resp: No shortness of breath with exertion or at rest.  No excess mucus, no productive cough,  No non-productive cough,  No coughing up of blood.  No change in color of mucus.  No wheezing.  No chest wall deformity  Skin: no rash or lesions.  GU: no dysuria, change in color of urine, no urgency or frequency.  No flank pain, no hematuria   MS:  No joint pain or swelling.  No decreased range of motion.  No back pain.    Physical Exam  BP 130/80 (BP Location: Left Arm, Patient Position: Sitting, Cuff Size: Normal)   Pulse (!) 104   Temp 98 F (36.7 C) (Temporal)   Ht 5'  7" (1.702 m)   Wt 196 lb 3.2 oz (89 kg)   SpO2 100%   BMI 30.73 kg/m   GEN: A/Ox3; pleasant , NAD, well nourished    HEENT:  [place]/AT,   NOSE-clear, THROAT-clear, no lesions, no postnasal drip or exudate noted.   NECK:  Supple w/ fair ROM; no JVD; normal carotid impulses w/o bruits; no thyromegaly or nodules palpated; no lymphadenopathy.    RESP  Clear  P & A; w/o, wheezes/ rales/ or rhonchi. no accessory muscle use, no dullness to percussion  CARD:  RRR, no m/r/g, no peripheral edema, pulses intact, no cyanosis or clubbing.  GI:   Soft & nt; nml bowel sounds; no organomegaly or masses detected.   Musco: Warm bil, no deformities or joint swelling noted.   Neuro: alert, no focal deficits noted.    Skin: Warm, no lesions or rashes    Lab Results:  CBC  BNP No results found for: BNP  ProBNP No results found for: PROBNP  Imaging: No results found.    PFT Results Latest Ref Rng & Units 12/27/2020  FVC-Pre L 3.55  FVC-Predicted Pre % 90  FVC-Post L 3.53  FVC-Predicted Post % 90  Pre FEV1/FVC % % 80  Post FEV1/FCV % % 83  FEV1-Pre L 2.84  FEV1-Predicted Pre % 91  FEV1-Post L 2.92  DLCO uncorrected ml/min/mmHg 31.21  DLCO UNC% % 134  DLCO corrected ml/min/mmHg 31.21  DLCO COR %Predicted % 134  DLVA Predicted % 130  TLC L 5.84  TLC % Predicted % 106  RV % Predicted % 110    No results found for: NITRICOXIDE      Assessment & Plan:   Lung nodule 1.3 cm right lower lobe lung nodule incidental finding on CT scan.  Repeat CT scan last month showed no significant change.  Patient is going to proceed with tissue sampling.  Her pulmonary function testing is normal today. Patient has an upcoming appointment with thoracic surgery to further evaluate area and decide on type of procedure to proceed with sampling  Plan  Patient Instructions  Continue with follow up with Dr. Kipp Brood next month as planned Follow up with Dr. Valeta Harms 6-8 weeks and As needed            Rexene Edison, NP 12/27/2020

## 2020-12-27 NOTE — Progress Notes (Signed)
PFT done today. 

## 2020-12-27 NOTE — Assessment & Plan Note (Signed)
1.3 cm right lower lobe lung nodule incidental finding on CT scan.  Repeat CT scan last month showed no significant change.  Patient is going to proceed with tissue sampling.  Her pulmonary function testing is normal today. Patient has an upcoming appointment with thoracic surgery to further evaluate area and decide on type of procedure to proceed with sampling  Plan  Patient Instructions  Continue with follow up with Dr. Kipp Brood next month as planned Follow up with Dr. Valeta Harms 6-8 weeks and As needed

## 2021-01-01 ENCOUNTER — Ambulatory Visit: Payer: BC Managed Care – PPO | Admitting: Adult Health

## 2021-01-03 ENCOUNTER — Encounter: Payer: BC Managed Care – PPO | Admitting: Thoracic Surgery (Cardiothoracic Vascular Surgery)

## 2021-01-06 NOTE — Progress Notes (Signed)
TP thanks for seeing her. HL seeing you this week  Thanks Leroy Sea

## 2021-01-07 ENCOUNTER — Inpatient Hospital Stay (HOSPITAL_COMMUNITY)
Admission: EM | Admit: 2021-01-07 | Discharge: 2021-01-22 | DRG: 393 | Disposition: A | Discharge status: Home / Self Care | Payer: BC Managed Care – PPO | Attending: Family Medicine | Admitting: Family Medicine

## 2021-01-07 ENCOUNTER — Emergency Department (HOSPITAL_COMMUNITY): Payer: BC Managed Care – PPO

## 2021-01-07 ENCOUNTER — Encounter (HOSPITAL_COMMUNITY): Payer: Self-pay

## 2021-01-07 DIAGNOSIS — K858 Other acute pancreatitis without necrosis or infection: Secondary | ICD-10-CM | POA: Diagnosis present

## 2021-01-07 DIAGNOSIS — K8500 Idiopathic acute pancreatitis without necrosis or infection: Secondary | ICD-10-CM

## 2021-01-07 DIAGNOSIS — R339 Retention of urine, unspecified: Secondary | ICD-10-CM | POA: Diagnosis present

## 2021-01-07 DIAGNOSIS — R739 Hyperglycemia, unspecified: Secondary | ICD-10-CM | POA: Diagnosis present

## 2021-01-07 DIAGNOSIS — F419 Anxiety disorder, unspecified: Secondary | ICD-10-CM | POA: Diagnosis not present

## 2021-01-07 DIAGNOSIS — K219 Gastro-esophageal reflux disease without esophagitis: Secondary | ICD-10-CM | POA: Diagnosis present

## 2021-01-07 DIAGNOSIS — E876 Hypokalemia: Secondary | ICD-10-CM

## 2021-01-07 DIAGNOSIS — J9811 Atelectasis: Secondary | ICD-10-CM | POA: Diagnosis present

## 2021-01-07 DIAGNOSIS — R0902 Hypoxemia: Secondary | ICD-10-CM

## 2021-01-07 DIAGNOSIS — E669 Obesity, unspecified: Secondary | ICD-10-CM | POA: Diagnosis present

## 2021-01-07 DIAGNOSIS — R001 Bradycardia, unspecified: Secondary | ICD-10-CM

## 2021-01-07 DIAGNOSIS — D72829 Elevated white blood cell count, unspecified: Secondary | ICD-10-CM

## 2021-01-07 DIAGNOSIS — J810 Acute pulmonary edema: Secondary | ICD-10-CM | POA: Diagnosis not present

## 2021-01-07 DIAGNOSIS — R0600 Dyspnea, unspecified: Secondary | ICD-10-CM

## 2021-01-07 DIAGNOSIS — R338 Other retention of urine: Secondary | ICD-10-CM | POA: Diagnosis not present

## 2021-01-07 DIAGNOSIS — D75839 Thrombocytosis, unspecified: Secondary | ICD-10-CM | POA: Diagnosis present

## 2021-01-07 DIAGNOSIS — R140 Abdominal distension (gaseous): Secondary | ICD-10-CM

## 2021-01-07 DIAGNOSIS — Z0189 Encounter for other specified special examinations: Secondary | ICD-10-CM

## 2021-01-07 DIAGNOSIS — T85598A Other mechanical complication of other gastrointestinal prosthetic devices, implants and grafts, initial encounter: Secondary | ICD-10-CM

## 2021-01-07 DIAGNOSIS — K567 Ileus, unspecified: Secondary | ICD-10-CM | POA: Diagnosis present

## 2021-01-07 DIAGNOSIS — J90 Pleural effusion, not elsewhere classified: Secondary | ICD-10-CM

## 2021-01-07 DIAGNOSIS — Z6835 Body mass index (BMI) 35.0-35.9, adult: Secondary | ICD-10-CM

## 2021-01-07 DIAGNOSIS — Z9889 Other specified postprocedural states: Secondary | ICD-10-CM

## 2021-01-07 DIAGNOSIS — Y848 Other medical procedures as the cause of abnormal reaction of the patient, or of later complication, without mention of misadventure at the time of the procedure: Secondary | ICD-10-CM | POA: Diagnosis present

## 2021-01-07 DIAGNOSIS — Z9049 Acquired absence of other specified parts of digestive tract: Secondary | ICD-10-CM

## 2021-01-07 DIAGNOSIS — K9189 Other postprocedural complications and disorders of digestive system: Secondary | ICD-10-CM | POA: Diagnosis not present

## 2021-01-07 DIAGNOSIS — F32A Depression, unspecified: Secondary | ICD-10-CM

## 2021-01-07 DIAGNOSIS — K859 Acute pancreatitis without necrosis or infection, unspecified: Secondary | ICD-10-CM | POA: Diagnosis present

## 2021-01-07 DIAGNOSIS — J9601 Acute respiratory failure with hypoxia: Secondary | ICD-10-CM | POA: Diagnosis present

## 2021-01-07 DIAGNOSIS — Z6836 Body mass index (BMI) 36.0-36.9, adult: Secondary | ICD-10-CM

## 2021-01-07 DIAGNOSIS — E8809 Other disorders of plasma-protein metabolism, not elsewhere classified: Secondary | ICD-10-CM | POA: Diagnosis present

## 2021-01-07 DIAGNOSIS — M545 Low back pain, unspecified: Secondary | ICD-10-CM | POA: Diagnosis present

## 2021-01-07 DIAGNOSIS — N179 Acute kidney failure, unspecified: Secondary | ICD-10-CM | POA: Diagnosis present

## 2021-01-07 DIAGNOSIS — Z4659 Encounter for fitting and adjustment of other gastrointestinal appliance and device: Secondary | ICD-10-CM

## 2021-01-07 DIAGNOSIS — Z20822 Contact with and (suspected) exposure to covid-19: Secondary | ICD-10-CM | POA: Diagnosis present

## 2021-01-07 DIAGNOSIS — G8929 Other chronic pain: Secondary | ICD-10-CM | POA: Diagnosis present

## 2021-01-07 DIAGNOSIS — D649 Anemia, unspecified: Secondary | ICD-10-CM | POA: Diagnosis present

## 2021-01-07 LAB — COMPREHENSIVE METABOLIC PANEL
ALT: 40 U/L (ref 0–44)
AST: 23 U/L (ref 15–41)
Albumin: 3.6 g/dL (ref 3.5–5.0)
Alkaline Phosphatase: 79 U/L (ref 38–126)
Anion gap: 7 (ref 5–15)
BUN: 11 mg/dL (ref 6–20)
CO2: 27 mmol/L (ref 22–32)
Calcium: 8.3 mg/dL — ABNORMAL LOW (ref 8.9–10.3)
Chloride: 108 mmol/L (ref 98–111)
Creatinine, Ser: 0.79 mg/dL (ref 0.44–1.00)
GFR, Estimated: >60 mL/min (ref >60)
Glucose, Bld: 148 mg/dL — ABNORMAL HIGH (ref 70–99)
Potassium: 3.3 mmol/L — ABNORMAL LOW (ref 3.5–5.1)
Sodium: 142 mmol/L (ref 135–145)
Total Bilirubin: 0.4 mg/dL (ref 0.3–1.2)
Total Protein: 6.2 g/dL — ABNORMAL LOW (ref 6.5–8.1)

## 2021-01-07 LAB — CBC WITH DIFFERENTIAL/PLATELET
Abs Immature Granulocytes: 0.09 10*3/uL — ABNORMAL HIGH (ref 0.00–0.07)
Basophils Absolute: 0.1 10*3/uL (ref 0.0–0.1)
Basophils Relative: 0 %
Eosinophils Absolute: 0.1 10*3/uL (ref 0.0–0.5)
Eosinophils Relative: 1 %
HCT: 40.1 % (ref 36.0–46.0)
Hemoglobin: 13.3 g/dL (ref 12.0–15.0)
Immature Granulocytes: 1 %
Lymphocytes Relative: 7 %
Lymphs Abs: 1.2 10*3/uL (ref 0.7–4.0)
MCH: 29.7 pg (ref 26.0–34.0)
MCHC: 33.2 g/dL (ref 30.0–36.0)
MCV: 89.5 fL (ref 80.0–100.0)
Monocytes Absolute: 0.5 10*3/uL (ref 0.1–1.0)
Monocytes Relative: 3 %
Neutro Abs: 15.5 10*3/uL — ABNORMAL HIGH (ref 1.7–7.7)
Neutrophils Relative %: 88 %
Platelets: 487 10*3/uL — ABNORMAL HIGH (ref 150–400)
RBC: 4.48 MIL/uL (ref 3.87–5.11)
RDW: 13.5 % (ref 11.5–15.5)
WBC: 17.5 10*3/uL — ABNORMAL HIGH (ref 4.0–10.5)
nRBC: 0 % (ref 0.0–0.2)

## 2021-01-07 LAB — LIPASE, BLOOD: Lipase: 4925 U/L — ABNORMAL HIGH (ref 11–51)

## 2021-01-07 MED ORDER — SODIUM CHLORIDE 0.9 % IV BOLUS
1000.0000 mL | Freq: Once | INTRAVENOUS | Status: AC
  Administered 2021-01-07: 1000 mL via INTRAVENOUS

## 2021-01-07 MED ORDER — SODIUM CHLORIDE (PF) 0.9 % IJ SOLN
INTRAMUSCULAR | Status: AC
  Filled 2021-01-07: qty 50

## 2021-01-07 MED ORDER — IOHEXOL 300 MG/ML  SOLN
100.0000 mL | Freq: Once | INTRAMUSCULAR | Status: AC | PRN
  Administered 2021-01-07: 100 mL via INTRAVENOUS

## 2021-01-07 MED ORDER — ESCITALOPRAM OXALATE 10 MG PO TABS
10.0000 mg | ORAL_TABLET | Freq: Every day | ORAL | Status: DC
  Administered 2021-01-08 – 2021-01-22 (×15): 10 mg via ORAL
  Filled 2021-01-07 (×16): qty 1

## 2021-01-07 MED ORDER — SENNA 8.6 MG PO TABS
1.0000 | ORAL_TABLET | Freq: Every day | ORAL | Status: DC
  Administered 2021-01-08 – 2021-01-13 (×6): 8.6 mg via ORAL
  Filled 2021-01-07 (×6): qty 1

## 2021-01-07 MED ORDER — MORPHINE SULFATE (PF) 2 MG/ML IV SOLN
2.0000 mg | INTRAVENOUS | Status: DC | PRN
  Administered 2021-01-08: 2 mg via INTRAVENOUS
  Filled 2021-01-07 (×2): qty 1

## 2021-01-07 MED ORDER — HYDROMORPHONE HCL 1 MG/ML IJ SOLN
1.0000 mg | Freq: Once | INTRAMUSCULAR | Status: AC
Start: 2021-01-07 — End: 2021-01-07
  Administered 2021-01-07: 1 mg via INTRAVENOUS
  Filled 2021-01-07: qty 1

## 2021-01-07 MED ORDER — SODIUM CHLORIDE 0.9 % IV SOLN
INTRAVENOUS | Status: DC
  Administered 2021-01-08 – 2021-01-09 (×2): 150 mL via INTRAVENOUS

## 2021-01-07 MED ORDER — POTASSIUM CHLORIDE CRYS ER 20 MEQ PO TBCR
40.0000 meq | EXTENDED_RELEASE_TABLET | Freq: Once | ORAL | Status: AC
  Administered 2021-01-08: 40 meq via ORAL
  Filled 2021-01-07: qty 2

## 2021-01-07 MED ORDER — HYDROMORPHONE HCL 1 MG/ML IJ SOLN
1.0000 mg | INTRAMUSCULAR | Status: DC | PRN
  Administered 2021-01-08 (×2): 1 mg via INTRAVENOUS
  Filled 2021-01-07 (×2): qty 1

## 2021-01-07 MED ORDER — MORPHINE SULFATE (PF) 4 MG/ML IV SOLN
6.0000 mg | Freq: Once | INTRAVENOUS | Status: AC
  Administered 2021-01-07: 6 mg via INTRAVENOUS
  Filled 2021-01-07: qty 2

## 2021-01-07 MED ORDER — HYDROMORPHONE HCL 1 MG/ML IJ SOLN
1.0000 mg | Freq: Once | INTRAMUSCULAR | Status: AC
  Administered 2021-01-07: 1 mg via INTRAVENOUS
  Filled 2021-01-07: qty 1

## 2021-01-07 MED ORDER — ONDANSETRON HCL 4 MG/2ML IJ SOLN
4.0000 mg | Freq: Four times a day (QID) | INTRAMUSCULAR | Status: DC | PRN
  Administered 2021-01-08 – 2021-01-19 (×7): 4 mg via INTRAVENOUS
  Filled 2021-01-07 (×7): qty 2

## 2021-01-07 MED ORDER — ONDANSETRON HCL 4 MG/2ML IJ SOLN
4.0000 mg | Freq: Once | INTRAMUSCULAR | Status: AC
  Administered 2021-01-07: 4 mg via INTRAVENOUS
  Filled 2021-01-07: qty 2

## 2021-01-07 MED ORDER — LORAZEPAM 2 MG/ML IJ SOLN
0.5000 mg | Freq: Once | INTRAMUSCULAR | Status: AC
  Administered 2021-01-07: 0.5 mg via INTRAVENOUS
  Filled 2021-01-07: qty 1

## 2021-01-07 MED ORDER — ENOXAPARIN SODIUM 40 MG/0.4ML IJ SOSY
40.0000 mg | PREFILLED_SYRINGE | INTRAMUSCULAR | Status: DC
  Administered 2021-01-08 – 2021-01-22 (×15): 40 mg via SUBCUTANEOUS
  Filled 2021-01-07 (×15): qty 0.4

## 2021-01-07 NOTE — ED Triage Notes (Signed)
Pt had an ERCP earlier today, after getting home pt tried to eat and started having nausea and vomiting and severe abd pain EMS gave 4mg  zofran and 150 mcg of fentanyl in route

## 2021-01-07 NOTE — ED Provider Notes (Signed)
[facility] DEPT Provider Note   CSN: [id number] Arrival date & time: 01/07/21  1946     History No chief complaint on file.   Kimberly Kelly is a 50 y.o. female.  Patient complains of abdominal pain.  She had ERCP today for sludge in her gallbladder.  She also had a sphincterotomy.  4 hours after she got back home she developed severe pain and nausea  The history is provided by the patient. No language interpreter was used.  Abdominal Pain Pain location:  Epigastric Pain quality: aching   Pain radiates to:  Does not radiate Pain severity:  Severe Onset quality:  Sudden Timing:  Constant Progression:  Worsening Chronicity:  New Context: not alcohol use   Associated symptoms: no chest pain, no cough, no diarrhea, no fatigue and no hematuria        Past Medical History:  Diagnosis Date  . Arthritis     Patient Active Problem List   Diagnosis Date Noted  . Lung nodule 12/27/2020  . Symptomatic cholelithiasis 10/10/2016    Past Surgical History:  Procedure Laterality Date  . CESAREAN SECTION    . CHOLECYSTECTOMY N/A 10/10/2016   Procedure: LAPAROSCOPIC CHOLECYSTECTOMY WITH INTRAOPERATIVE CHOLANGIOGRAM;  Surgeon: Erroll Luna, MD;  Location: [facility];  Service: General;  Laterality: N/A;  . SHOULDER ARTHROSCOPY    . TUBAL LIGATION       OB History   No obstetric history on file.     Family History  Problem Relation Age of Onset  . Breast cancer Paternal Grandmother     Social History   Tobacco Use  . Smoking status: Never Smoker  . Smokeless tobacco: Never Used  Substance Use Topics  . Alcohol use: Yes    Alcohol/week: 7.0 standard drinks    Types: 7 Glasses of wine per week  . Drug use: No    Home Medications Prior to Admission medications   Medication Sig Start Date End Date Taking? Authorizing Provider  escitalopram (LEXAPRO) 10 MG tablet Take by mouth. 08/13/20   [provider]  traMADol (ULTRAM) 50 MG  tablet Take 50 mg by mouth daily as needed. Patient not taking: Reported on 12/27/2020 05/20/20   [provider]    Allergies    Patient has no known allergies.  Review of Systems   Review of Systems  Constitutional: Negative for appetite change and fatigue.  HENT: Negative for congestion, ear discharge and sinus pressure.   Eyes: Negative for discharge.  Respiratory: Negative for cough.   Cardiovascular: Negative for chest pain.  Gastrointestinal: Positive for abdominal pain. Negative for diarrhea.  Genitourinary: Negative for frequency and hematuria.  Musculoskeletal: Negative for back pain.  Skin: Negative for rash.  Neurological: Negative for seizures and headaches.  Psychiatric/Behavioral: Negative for hallucinations.    Physical Exam Updated Vital Signs BP 125/80   Pulse (!) 56   Temp 97.7 F (36.5 C) (Oral)   Resp 16   SpO2 97%   Physical Exam Vitals and nursing note reviewed.  Constitutional:      General: She is in acute distress.     Appearance: She is well-developed.  HENT:     Head: Normocephalic.     Nose: Nose normal.     Mouth/Throat:     Mouth: Mucous membranes are moist.  Eyes:     General: No scleral icterus.    Conjunctiva/sclera: Conjunctivae normal.  Neck:     Thyroid: No thyromegaly.  Cardiovascular:  Rate and Rhythm: Normal rate and regular rhythm.     Heart sounds: No murmur heard. No friction rub. No gallop.   Pulmonary:     Breath sounds: No stridor. No wheezing or rales.  Chest:     Chest wall: No tenderness.  Abdominal:     General: There is no distension.     Tenderness: There is abdominal tenderness. There is no rebound.  Musculoskeletal:        General: Normal range of motion.     Cervical back: Neck supple.  Lymphadenopathy:     Cervical: No cervical adenopathy.  Skin:    Findings: No erythema or rash.  Neurological:     Mental Status: She is alert and oriented to person, place, and time.     Motor: No  abnormal muscle tone.     Coordination: Coordination normal.  Psychiatric:        Behavior: Behavior normal.     ED Results / Procedures / Treatments   Labs (all labs ordered are listed, but only abnormal results are displayed) Labs Reviewed  CBC WITH DIFFERENTIAL/PLATELET - Abnormal; Notable for the following components:      Result Value   WBC 17.5 (*)    Platelets 487 (*)    Neutro Abs 15.5 (*)    Abs Immature Granulocytes 0.09 (*)    All other components within normal limits  COMPREHENSIVE METABOLIC PANEL - Abnormal; Notable for the following components:   Potassium 3.3 (*)    Glucose, Bld 148 (*)    Calcium 8.3 (*)    Total Protein 6.2 (*)    All other components within normal limits  LIPASE, BLOOD - Abnormal; Notable for the following components:   Lipase 4,925 (*)    All other components within normal limits    EKG None  Radiology CT ABDOMEN PELVIS W CONTRAST  Result Date: 01/07/2021 CLINICAL DATA:  ERCP earlier today, now with nausea and vomiting and acute abdominal pain. EXAM: CT ABDOMEN AND PELVIS WITH CONTRAST TECHNIQUE: Multidetector CT imaging of the abdomen and pelvis was performed using the standard protocol following bolus administration of intravenous contrast. CONTRAST:  137mL OMNIPAQUE IOHEXOL 300 MG/ML  SOLN COMPARISON:  None FINDINGS: Lower chest: Mild bibasilar atelectasis. Impacted airway noted at the left lung base. The heart and pericardium are unremarkable. Hepatobiliary: No focal liver abnormality is seen. Status post cholecystectomy. No biliary dilatation. Pancreas: There is extensive peripancreatic inflammatory change and edema extending into the anterior pararenal spaces bilaterally. There is mild ascites present. The inflammatory changes encompass the entire pancreas. The pancreatic parenchyma diffusely demonstrates mild hypoenhancement with more focal hypoenhancement involving the head and uncinate process. No areas of frank non enhancement are  identified, however, to suggest pancreatic necrosis. The pancreatic duct is not dilated. No pancreatic parenchymal calcifications. No loculated peripancreatic fluid collections are identified. Altogether, the findings are in keeping with moderate acute interstitial/edematous pancreatitis. Spleen: Unremarkable Adrenals/Urinary Tract: Adrenal glands are unremarkable. Kidneys are normal, without renal calculi, focal lesion, or hydronephrosis. Bladder is unremarkable. Stomach/Bowel: Stomach is within normal limits. Appendix appears normal. No evidence of bowel wall thickening, distention, or inflammatory changes. No free intraperitoneal gas. Vascular/Lymphatic: The abdominal vasculature is unremarkable. Specifically, the splenic vein, superior mesenteric vein, and portal vein are patent. The celiac axis and proximal superior mesenteric artery are unremarkable. Circumaortic left renal vein is noted. No pathologic adenopathy within the abdomen and pelvis. Reproductive: Uterus and bilateral adnexa are unremarkable. Other: No abdominal wall hernia.  Rectum unremarkable.  Musculoskeletal: No acute bone abnormality. IMPRESSION: Moderate, acute, edematous/interstitial pancreatitis. Mild ascites. Note there are areas of relative hypoenhancement within the head and uncinate process of the pancreas without frank necrosis noted at this time. Follow-up evaluation may be warranted to ensure continued viability of the pancreatic parenchyma. Electronically Signed   By: Fidela Salisbury MD   On: 01/07/2021 22:37    Procedures Procedures   Medications Ordered in ED Medications  HYDROmorphone (DILAUDID) injection 1 mg (1 mg Intravenous Given 01/07/21 2048)  LORazepam (ATIVAN) injection 0.5 mg (0.5 mg Intravenous Given 01/07/21 2049)  sodium chloride 0.9 % bolus 1,000 mL (0 mLs Intravenous Stopped 01/07/21 2259)  ondansetron (ZOFRAN) injection 4 mg (4 mg Intravenous Given 01/07/21 2107)  HYDROmorphone (DILAUDID) injection 1 mg (1 mg  Intravenous Given 01/07/21 2140)  iohexol (OMNIPAQUE) 300 MG/ML solution 100 mL (100 mLs Intravenous Contrast Given 01/07/21 2154)  sodium chloride (PF) 0.9 % injection (  Given 01/07/21 2259)  HYDROmorphone (DILAUDID) injection 1 mg (1 mg Intravenous Given 01/07/21 2238)  sodium chloride 0.9 % bolus 1,000 mL (1,000 mLs Intravenous New Bag/Given 01/07/21 2328)  morphine 4 MG/ML injection 6 mg (6 mg Intravenous Given 01/07/21 2319)    ED Course  I have reviewed the triage vital signs and the nursing notes.  Pertinent labs & imaging results that were available during my care of the patient were reviewed by me and considered in my medical decision making (see chart for details).    CRITICAL CARE Performed by: Milton Ferguson Total critical care time: 35 minutes Critical care time was exclusive of separately billable procedures and treating other patients. Critical care was necessary to treat or prevent imminent or life-threatening deterioration. Critical care was time spent personally by me on the following activities: development of treatment plan with patient and/or surrogate as well as nursing, discussions with consultants, evaluation of patient's response to treatment, examination of patient, obtaining history from patient or surrogate, ordering and performing treatments and interventions, ordering and review of laboratory studies, ordering and review of radiographic studies, pulse oximetry and re-evaluation of patient's condition. Patient with severe pancreatitis from her ERCP today.  Patient would prefer to stay over at [facility] today.  I spoke with [facility] gastroenterologist Dr. Cristina Gong and he felt like it would be fine for the patient to be admitted to [facility] and get pain medicines and plenty of fluids. [facility] will see the patient tomorrow MDM Rules/Calculators/A&P                          Pancreatitis from ERCP.  Medicine will admit and GI will consult Final Clinical Impression(s) / ED  Diagnoses Final diagnoses:  Idiopathic acute pancreatitis without infection or necrosis    Rx / DC Orders ED Discharge Orders    None       Milton Ferguson, MD 01/07/21 2333

## 2021-01-07 NOTE — H&P (Signed)
History and Physical    Kimberly Kelly GOT:[id number] DOB: 06/29/1971 DOA: 01/07/2021  PCP: [name], [place]  Patient coming from: Home  I have personally briefly reviewed patient's old medical records in [place]  Chief Complaint: Home  HPI: Kimberly Kelly is a 50 y.o. female with medical history significant for lung nodules, anxiety/depression who presents with worsening abdominal pain following an ERCP.    Pt has history of cholecystectomy in 2018.  However December 2021 she began to note occasional right upper quadrant abdominal colicky pain.  She then followed up with GI at [facility] and eventually had ERCP today with sphincterotomy with removal of gallbladder sludge.  A few hours after the procedure she began to develop acute abdominal pain that progressively worsened.  Also had nausea and vomiting.  No diarrhea.  Last bowel movement yesterday.  States she also has not urinated since earlier this morning.  He denies any dysuria.  ED Course: She was bradycardic, normotensive.  WBC of 17.  Platelet of 47.  K of 3.3.  BG 148.  Lipase of >4000.  CT of the abdomen shows diffuse pancreatic inflammation with no necrosis or abscess.  GI Dr. Cristina Gong recommended aggressive fluid management and no antibiotics.  Patient has received up to 2 L of IV normal saline here with multiple doses of IV Dilaudid and morphine for pain control and hospitalist called for admission.  Review of Systems:  Constitutional: No Weight Change, No Fever ENT/Mouth: No sore throat, No Rhinorrhea Eyes: No Eye Pain, No Vision Changes Cardiovascular: No Chest Pain, no SOB Respiratory: No Cough, No Sputum, No Wheezing, no Dyspnea  Gastrointestinal: + Nausea, + Vomiting, No Diarrhea, No Constipation,+ Pain Genitourinary: no Urinary Incontinence Musculoskeletal: No Arthralgias, No Myalgias Skin: No Skin Lesions, No Pruritus, Neuro: no Weakness, No Numbness Psych: No Anxiety/Panic, No Depression, no decrease  appetite Heme/Lymph: No Bruising, No Bleeding  Past Medical History:  Diagnosis Date  . Arthritis     Past Surgical History:  Procedure Laterality Date  . CESAREAN SECTION    . CHOLECYSTECTOMY N/A 10/10/2016   Procedure: LAPAROSCOPIC CHOLECYSTECTOMY WITH INTRAOPERATIVE CHOLANGIOGRAM;  Surgeon: Erroll Luna, MD;  Location: [facility];  Service: General;  Laterality: N/A;  . SHOULDER ARTHROSCOPY    . TUBAL LIGATION       reports that she has never smoked. She has never used smokeless tobacco. She reports current alcohol use of about 7.0 standard drinks of alcohol per week. She reports that she does not use drugs. Social History  No Known Allergies  Family History  Problem Relation Age of Onset  . Breast cancer Paternal Grandmother      Prior to Admission medications   Medication Sig Start Date End Date Taking? Authorizing Provider  escitalopram (LEXAPRO) 10 MG tablet Take by mouth. 08/13/20   [provider]  traMADol (ULTRAM) 50 MG tablet Take 50 mg by mouth daily as needed. Patient not taking: Reported on 12/27/2020 05/20/20   [provider]    Physical Exam: Vitals:   01/07/21 2145 01/07/21 2250 01/07/21 2300 01/07/21 2330  BP: 105/68 125/80 128/86 125/86  Pulse: (!) 53 (!) 56 (!) 50 (!) 57  Resp: (!) 21 16 18 20   Temp:      TempSrc:      SpO2: 91% 97% 99% 99%    Constitutional: NAD, middle-age female laying in bed in discomfort and moaning in pain with eyes closed Vitals:   01/07/21 2145 01/07/21 2250 01/07/21 2300 01/07/21  2330  BP: 105/68 125/80 128/86 125/86  Pulse: (!) 53 (!) 56 (!) 50 (!) 57  Resp: (!) 21 16 18 20   Temp:      TempSrc:      SpO2: 91% 97% 99% 99%   Eyes: PERRL, lids and conjunctivae normal ENMT: Mucous membranes are moist.  Neck: normal, supple Respiratory: clear to auscultation bilaterally, no wheezing, no crackles. Normal respiratory effort. No accessory muscle use.  Cardiovascular: bradycardia, no murmurs / rubs /  gallops. No extremity edema. 2+ pedal pulses.   Abdomen: Upper and suprapubic tenderness without rebound tenderness, guarding or rigidity, no masses palpated.Bowel sounds positive.  Musculoskeletal: no clubbing / cyanosis. No joint deformity upper and lower extremities. Good ROM, no contractures. Normal muscle tone.  Skin: no rashes, lesions, ulcers. No induration Neurologic: CN 2-12 grossly intact. Sensation intact, Strength 5/5 in all 4.  Psychiatric: Normal judgment and insight. Alert and oriented x 3. Normal mood.     Labs on Admission: I have personally reviewed following labs and imaging studies  CBC: Recent Labs  Lab 01/07/21 2050  WBC 17.5*  NEUTROABS 15.5*  HGB 13.3  HCT 40.1  MCV 89.5  PLT 810*   Basic Metabolic Panel: Recent Labs  Lab 01/07/21 2050  NA 142  K 3.3*  CL 108  CO2 27  GLUCOSE 148*  BUN 11  CREATININE 0.79  CALCIUM 8.3*   GFR: Estimated Creatinine Clearance: 96.4 mL/min (by C-G formula based on SCr of 0.79 mg/dL). Liver Function Tests: Recent Labs  Lab 01/07/21 2050  AST 23  ALT 40  ALKPHOS 79  BILITOT 0.4  PROT 6.2*  ALBUMIN 3.6   Recent Labs  Lab 01/07/21 2050  LIPASE 4,925*   No results for input(s): AMMONIA in the last 168 hours. Coagulation Profile: No results for input(s): INR, PROTIME in the last 168 hours. Cardiac Enzymes: No results for input(s): CKTOTAL, CKMB, CKMBINDEX, TROPONINI in the last 168 hours. BNP (last 3 results) No results for input(s): PROBNP in the last 8760 hours. HbA1C: No results for input(s): HGBA1C in the last 72 hours. CBG: No results for input(s): GLUCAP in the last 168 hours. Lipid Profile: No results for input(s): CHOL, HDL, LDLCALC, TRIG, CHOLHDL, LDLDIRECT in the last 72 hours. Thyroid Function Tests: No results for input(s): TSH, T4TOTAL, FREET4, T3FREE, THYROIDAB in the last 72 hours. Anemia Panel: No results for input(s): VITAMINB12, FOLATE, FERRITIN, TIBC, IRON, RETICCTPCT in the last 72  hours. Urine analysis:    Component Value Date/Time   COLORURINE AMBER (A) 09/22/2012 1625   APPEARANCEUR TURBID (A) 09/22/2012 1625   LABSPEC 1.034 (H) 09/22/2012 1625   PHURINE 5.0 09/22/2012 1625   GLUCOSEU NEGATIVE 09/22/2012 1625   HGBUR SMALL (A) 09/22/2012 1625   BILIRUBINUR SMALL (A) 09/22/2012 1625   KETONESUR 15 (A) 09/22/2012 1625   PROTEINUR NEGATIVE 09/22/2012 1625   UROBILINOGEN 0.2 09/22/2012 1625   NITRITE NEGATIVE 09/22/2012 1625   LEUKOCYTESUR SMALL (A) 09/22/2012 1625    Radiological Exams on Admission: CT ABDOMEN PELVIS W CONTRAST  Result Date: 01/07/2021 CLINICAL DATA:  ERCP earlier today, now with nausea and vomiting and acute abdominal pain. EXAM: CT ABDOMEN AND PELVIS WITH CONTRAST TECHNIQUE: Multidetector CT imaging of the abdomen and pelvis was performed using the standard protocol following bolus administration of intravenous contrast. CONTRAST:  139mL OMNIPAQUE IOHEXOL 300 MG/ML  SOLN COMPARISON:  None FINDINGS: Lower chest: Mild bibasilar atelectasis. Impacted airway noted at the left lung base. The heart and pericardium are unremarkable.  Hepatobiliary: No focal liver abnormality is seen. Status post cholecystectomy. No biliary dilatation. Pancreas: There is extensive peripancreatic inflammatory change and edema extending into the anterior pararenal spaces bilaterally. There is mild ascites present. The inflammatory changes encompass the entire pancreas. The pancreatic parenchyma diffusely demonstrates mild hypoenhancement with more focal hypoenhancement involving the head and uncinate process. No areas of frank non enhancement are identified, however, to suggest pancreatic necrosis. The pancreatic duct is not dilated. No pancreatic parenchymal calcifications. No loculated peripancreatic fluid collections are identified. Altogether, the findings are in keeping with moderate acute interstitial/edematous pancreatitis. Spleen: Unremarkable Adrenals/Urinary Tract: Adrenal  glands are unremarkable. Kidneys are normal, without renal calculi, focal lesion, or hydronephrosis. Bladder is unremarkable. Stomach/Bowel: Stomach is within normal limits. Appendix appears normal. No evidence of bowel wall thickening, distention, or inflammatory changes. No free intraperitoneal gas. Vascular/Lymphatic: The abdominal vasculature is unremarkable. Specifically, the splenic vein, superior mesenteric vein, and portal vein are patent. The celiac axis and proximal superior mesenteric artery are unremarkable. Circumaortic left renal vein is noted. No pathologic adenopathy within the abdomen and pelvis. Reproductive: Uterus and bilateral adnexa are unremarkable. Other: No abdominal wall hernia.  Rectum unremarkable. Musculoskeletal: No acute bone abnormality. IMPRESSION: Moderate, acute, edematous/interstitial pancreatitis. Mild ascites. Note there are areas of relative hypoenhancement within the head and uncinate process of the pancreas without frank necrosis noted at this time. Follow-up evaluation may be warranted to ensure continued viability of the pancreatic parenchyma. Electronically Signed   By: Fidela Salisbury MD   On: 01/07/2021 22:37      Assessment/Plan  Acute pancreatitis s/p ERCP -Patient underwent ERCP with sphincterotomy today at [facility] and developed abdominal pain shortly after -Has received 2 L of IV fluid in ED.  Continues IV 200 cc/h fluid.  GI recommends aggressive fluid and no antibiotics. -PRN pain management  -clear liquid diet   Urinary retention -likely due to over distention with large fluid boluses - check bladder scan and do in and out cath as needed   Hypokalemia -replete with K supplementation  Thrombocytosis/Leukocytosis  -likely reactive to pancreatitis  Asymptomatic bradycardia -Unclear etiology. Will keep on telemetry.   Depression/anxiety  -continue Lexapro  DVT prophylaxis:.Lovenox Code Status: Full Family Communication: Plan discussed  with patient and husband at bedside  disposition Plan: Home with observation Consults called:  Admission status: Observation   Level of care: Telemetry  Status is: Observation  The patient remains OBS appropriate and will d/c before 2 midnights.  Dispo: The patient is from: Home              Anticipated d/c is to: Home              Patient currently is not medically stable to d/c.   Difficult to place patient No         Orene Desanctis DO [facility]   If 7PM-7AM, please contact night-coverage [email or web address]   01/07/2021, 11:46 PM

## 2021-01-07 NOTE — Progress Notes (Signed)
Spoke w/ Dr. Roderic Palau about pt.  We will plan to see in a.m. with respect to acute post-ERCP pancreatitis.  Generic recommendations in this setting:  1. Aggressive hydration (NS 500 mL/hr for several liters, then can start backing off on rate)  2. Would not favor prophylactic antbx at present   Call in the meantime if questions.  Cleotis Nipper, M.D. Pager [phone] If no answer or after 5 PM call [phone]

## 2021-01-08 DIAGNOSIS — Y848 Other medical procedures as the cause of abnormal reaction of the patient, or of later complication, without mention of misadventure at the time of the procedure: Secondary | ICD-10-CM | POA: Diagnosis present

## 2021-01-08 DIAGNOSIS — E669 Obesity, unspecified: Secondary | ICD-10-CM | POA: Diagnosis not present

## 2021-01-08 DIAGNOSIS — R339 Retention of urine, unspecified: Secondary | ICD-10-CM | POA: Diagnosis present

## 2021-01-08 DIAGNOSIS — E8809 Other disorders of plasma-protein metabolism, not elsewhere classified: Secondary | ICD-10-CM | POA: Diagnosis present

## 2021-01-08 DIAGNOSIS — D75839 Thrombocytosis, unspecified: Secondary | ICD-10-CM

## 2021-01-08 DIAGNOSIS — D72829 Elevated white blood cell count, unspecified: Secondary | ICD-10-CM

## 2021-01-08 DIAGNOSIS — J9601 Acute respiratory failure with hypoxia: Secondary | ICD-10-CM | POA: Diagnosis not present

## 2021-01-08 DIAGNOSIS — K858 Other acute pancreatitis without necrosis or infection: Secondary | ICD-10-CM | POA: Diagnosis not present

## 2021-01-08 DIAGNOSIS — F32A Depression, unspecified: Secondary | ICD-10-CM

## 2021-01-08 DIAGNOSIS — G8929 Other chronic pain: Secondary | ICD-10-CM | POA: Diagnosis present

## 2021-01-08 DIAGNOSIS — F419 Anxiety disorder, unspecified: Secondary | ICD-10-CM

## 2021-01-08 DIAGNOSIS — N179 Acute kidney failure, unspecified: Secondary | ICD-10-CM | POA: Diagnosis not present

## 2021-01-08 DIAGNOSIS — J90 Pleural effusion, not elsewhere classified: Secondary | ICD-10-CM | POA: Diagnosis not present

## 2021-01-08 DIAGNOSIS — J9811 Atelectasis: Secondary | ICD-10-CM | POA: Diagnosis present

## 2021-01-08 DIAGNOSIS — Z20822 Contact with and (suspected) exposure to covid-19: Secondary | ICD-10-CM | POA: Diagnosis not present

## 2021-01-08 DIAGNOSIS — R001 Bradycardia, unspecified: Secondary | ICD-10-CM

## 2021-01-08 DIAGNOSIS — E876 Hypokalemia: Secondary | ICD-10-CM | POA: Diagnosis present

## 2021-01-08 DIAGNOSIS — J810 Acute pulmonary edema: Secondary | ICD-10-CM | POA: Diagnosis not present

## 2021-01-08 DIAGNOSIS — Z6836 Body mass index (BMI) 36.0-36.9, adult: Secondary | ICD-10-CM | POA: Diagnosis not present

## 2021-01-08 DIAGNOSIS — K567 Ileus, unspecified: Secondary | ICD-10-CM | POA: Diagnosis present

## 2021-01-08 DIAGNOSIS — K219 Gastro-esophageal reflux disease without esophagitis: Secondary | ICD-10-CM | POA: Diagnosis present

## 2021-01-08 DIAGNOSIS — Z6835 Body mass index (BMI) 35.0-35.9, adult: Secondary | ICD-10-CM | POA: Diagnosis not present

## 2021-01-08 DIAGNOSIS — R338 Other retention of urine: Secondary | ICD-10-CM | POA: Diagnosis not present

## 2021-01-08 DIAGNOSIS — M545 Low back pain, unspecified: Secondary | ICD-10-CM | POA: Diagnosis present

## 2021-01-08 DIAGNOSIS — D649 Anemia, unspecified: Secondary | ICD-10-CM | POA: Diagnosis not present

## 2021-01-08 DIAGNOSIS — R739 Hyperglycemia, unspecified: Secondary | ICD-10-CM | POA: Diagnosis present

## 2021-01-08 DIAGNOSIS — K859 Acute pancreatitis without necrosis or infection, unspecified: Secondary | ICD-10-CM | POA: Diagnosis present

## 2021-01-08 DIAGNOSIS — K9189 Other postprocedural complications and disorders of digestive system: Secondary | ICD-10-CM | POA: Diagnosis not present

## 2021-01-08 LAB — CBC
HCT: 44.5 % (ref 36.0–46.0)
Hemoglobin: 14.4 g/dL (ref 12.0–15.0)
MCH: 29.5 pg (ref 26.0–34.0)
MCHC: 32.4 g/dL (ref 30.0–36.0)
MCV: 91.2 fL (ref 80.0–100.0)
Platelets: 368 10*3/uL (ref 150–400)
RBC: 4.88 MIL/uL (ref 3.87–5.11)
RDW: 13.8 % (ref 11.5–15.5)
WBC: 11.9 10*3/uL — ABNORMAL HIGH (ref 4.0–10.5)
nRBC: 0 % (ref 0.0–0.2)

## 2021-01-08 LAB — BASIC METABOLIC PANEL
Anion gap: 7 (ref 5–15)
BUN: 9 mg/dL (ref 6–20)
CO2: 24 mmol/L (ref 22–32)
Calcium: 8.1 mg/dL — ABNORMAL LOW (ref 8.9–10.3)
Chloride: 111 mmol/L (ref 98–111)
Creatinine, Ser: 0.69 mg/dL (ref 0.44–1.00)
GFR, Estimated: >60 mL/min (ref >60)
Glucose, Bld: 172 mg/dL — ABNORMAL HIGH (ref 70–99)
Potassium: 4.4 mmol/L (ref 3.5–5.1)
Sodium: 142 mmol/L (ref 135–145)

## 2021-01-08 LAB — RESP PANEL BY RT-PCR (FLU A&B, COVID) ARPGX2
Influenza A by PCR: NEGATIVE
Influenza B by PCR: NEGATIVE
SARS Coronavirus 2 by RT PCR: NEGATIVE

## 2021-01-08 MED ORDER — METOCLOPRAMIDE HCL 5 MG/ML IJ SOLN
10.0000 mg | Freq: Four times a day (QID) | INTRAMUSCULAR | Status: DC | PRN
  Administered 2021-01-08 – 2021-01-17 (×6): 10 mg via INTRAVENOUS
  Filled 2021-01-08 (×6): qty 2

## 2021-01-08 MED ORDER — MORPHINE SULFATE (PF) 2 MG/ML IV SOLN
2.0000 mg | Freq: Once | INTRAVENOUS | Status: AC
Start: 2021-01-08 — End: 2021-01-08
  Administered 2021-01-08: 2 mg via INTRAVENOUS
  Filled 2021-01-08: qty 1

## 2021-01-08 MED ORDER — HYDROMORPHONE HCL 1 MG/ML IJ SOLN
1.0000 mg | INTRAMUSCULAR | Status: DC | PRN
Start: 2021-01-08 — End: 2021-01-11
  Administered 2021-01-08: 2 mg via INTRAVENOUS
  Administered 2021-01-08: 1 mg via INTRAVENOUS
  Administered 2021-01-08: 2 mg via INTRAVENOUS
  Administered 2021-01-08: 1 mg via INTRAVENOUS
  Administered 2021-01-08 – 2021-01-09 (×2): 2 mg via INTRAVENOUS
  Administered 2021-01-09 – 2021-01-10 (×5): 1 mg via INTRAVENOUS
  Administered 2021-01-10: 2 mg via INTRAVENOUS
  Administered 2021-01-10: 1 mg via INTRAVENOUS
  Administered 2021-01-10 (×2): 2 mg via INTRAVENOUS
  Administered 2021-01-11: 1 mg via INTRAVENOUS
  Administered 2021-01-11: 2 mg via INTRAVENOUS
  Administered 2021-01-11 (×2): 1 mg via INTRAVENOUS
  Filled 2021-01-08: qty 1
  Filled 2021-01-08 (×2): qty 2
  Filled 2021-01-08 (×3): qty 1
  Filled 2021-01-08: qty 2
  Filled 2021-01-08 (×6): qty 1
  Filled 2021-01-08: qty 2
  Filled 2021-01-08: qty 1
  Filled 2021-01-08 (×6): qty 2

## 2021-01-08 NOTE — Consult Note (Signed)
Referring Provider: Dr. Ileene Musa Primary Care Physician:  [name], [place] Primary Gastroenterologist:  Dr. Earlean Shawl  Reason for Consultation:  Acute pancreatitis s/p ERCP  HPI: Kimberly Kelly is a 50 y.o. female with medical history significant for lung nodules, diverticulosis, anxiety/depression, s/p cholecystectomy 2018 presents s/p ERCP with acute pancreatitis.   Patient had EUS/ERCP with sphincterotomy at [facility] with Dr. Delrae Alfred, her primary gastroenterologist is Dr. Earlean Shawl, presented with acute pancreatitis to [facility], ER.   Patient had leukocytosis white blood cell 17, platelets of 487 potassium 3.3 blood glucose 148, lipase over 4000.  CT of abdomen showed diffuse pancreatic inflammation but no necrosis or abscess. Current white blood cell count trending down to 11.9, platelets have improved to 368. Calcium 8.1  Patient status post cholecystectomy 2018, having 3 to 4 months of colicky biliary pain with elevated liver function, set up for ERCP and had ampullary stenosis s/p sphincterotomy with Dr. Delrae Alfred.  About 4 to 6 hours after the procedure patient started to have progressively worsening epigastric pain with radiation to her back.  Patient having nausea and vomiting. Last episode of vomiting was was last night.  Patient on clear liquids. Patient denies fever, has had some shaking which she attributes to anesthesia. Last bowel movement was yesterday, denies diarrhea, constipation, blood in the stool, melena. No family history of liver cancer, inflammatory bowel disease, pancreatitis.  CT AB and pelvis 01/08/21 IMPRESSION: Moderate, acute, edematous/interstitial pancreatitis. Mild ascites. Note there are areas of relative hypoenhancement within the head and uncinate process of the pancreas without frank necrosis noted at this time. Follow-up evaluation may be warranted to ensure continued viability of the pancreatic parenchyma.  EUS/ERCP 01/07/21 Impression 1) Ampullary  stenosis post sphincterotomy 2) Scant sludge in CBD.   Past Medical History:  Diagnosis Date  . Arthritis     Past Surgical History:  Procedure Laterality Date  . CESAREAN SECTION    . CHOLECYSTECTOMY N/A 10/10/2016   Procedure: LAPAROSCOPIC CHOLECYSTECTOMY WITH INTRAOPERATIVE CHOLANGIOGRAM;  Surgeon: Erroll Luna, MD;  Location: [place];  Service: General;  Laterality: N/A;  . SHOULDER ARTHROSCOPY    . TUBAL LIGATION      Prior to Admission medications   Medication Sig Start Date End Date Taking? Authorizing Provider  escitalopram (LEXAPRO) 10 MG tablet Take 10 mg by mouth daily. 08/13/20  Yes [provider]  traMADol (ULTRAM) 50 MG tablet Take 50 mg by mouth daily as needed. Patient not taking: No sig reported 05/20/20   [provider]    Scheduled Meds: . enoxaparin (LOVENOX) injection  40 mg Subcutaneous Q24H  . escitalopram  10 mg Oral Daily  . senna  1 tablet Oral Daily   Continuous Infusions: . sodium chloride 200 mL/hr at 01/08/21 0020   PRN Meds:.HYDROmorphone (DILAUDID) injection, metoCLOPramide (REGLAN) injection, ondansetron (ZOFRAN) IV  Allergies as of 01/07/2021  . (No Known Allergies)    Family History  Problem Relation Age of Onset  . Breast cancer Paternal Grandmother     Social History   Socioeconomic History  . Marital status: Married    Spouse name: Not on file  . Number of children: Not on file  . Years of education: Not on file  . Highest education level: Not on file  Occupational History  . Not on file  Tobacco Use  . Smoking status: Never Smoker  . Smokeless tobacco: Never Used  Substance and Sexual Activity  . Alcohol use: Yes    Alcohol/week: 7.0 standard drinks  Types: 7 Glasses of wine per week  . Drug use: No  . Sexual activity: Not on file  Other Topics Concern  . Not on file  Social History Narrative  . Not on file   Social Determinants of Health   Financial Resource Strain: Not on file  Food  Insecurity: Not on file  Transportation Needs: Not on file  Physical Activity: Not on file  Stress: Not on file  Social Connections: Not on file  Intimate Partner Violence: Not on file    Review of Systems:  Review of Systems  Constitutional: Negative for chills, fever and weight loss.  HENT: Negative for hearing loss.   Eyes: Negative for discharge and redness.  Respiratory: Negative for shortness of breath.   Cardiovascular: Negative for chest pain and leg swelling.  Gastrointestinal: Positive for abdominal pain, nausea and vomiting. Negative for constipation, diarrhea and melena.  Musculoskeletal: Negative for falls.  Skin: Negative for itching.  Neurological: Negative for loss of consciousness.  Psychiatric/Behavioral: Negative for memory loss and substance abuse.     Physical Exam: Vital signs: Vitals:   01/08/21 0730 01/08/21 0800  BP: 117/79 135/88  Pulse: 77 98  Resp: (!) 26 17  Temp:    SpO2: 95% 96%     Physical Exam Constitutional:      Appearance: Normal appearance. She is not toxic-appearing.     Comments: Patient does appear uncomfortable  Eyes:     Conjunctiva/sclera: Conjunctivae normal.  Cardiovascular:     Rate and Rhythm: Normal rate and regular rhythm.     Heart sounds: No murmur heard.   Pulmonary:     Effort: Pulmonary effort is normal.     Breath sounds: Normal breath sounds. No wheezing.  Abdominal:     Comments: Patient with mild distention, firm upper abdomen without rigidity or rebound tenderness.  Patient is very tender on examination worsening epigastric area but pain is diffuse.  Bowel sounds decreased.  Musculoskeletal:        General: No swelling. Normal range of motion.     Cervical back: Normal range of motion.  Skin:    General: Skin is warm.     Coloration: Skin is not jaundiced.  Neurological:     General: No focal deficit present.     Mental Status: She is alert and oriented to person, place, and time.  Psychiatric:         Mood and Affect: Mood normal.        Behavior: Behavior normal.        GI:  Lab Results: Recent Labs    01/07/21 2050 01/08/21 0500  WBC 17.5* 11.9*  HGB 13.3 14.4  HCT 40.1 44.5  PLT 487* 368   BMET Recent Labs    01/07/21 2050 01/08/21 0500  NA 142 142  K 3.3* 4.4  CL 108 111  CO2 27 24  GLUCOSE 148* 172*  BUN 11 9  CREATININE 0.79 0.69  CALCIUM 8.3* 8.1*   LFT Recent Labs    01/07/21 2050  PROT 6.2*  ALBUMIN 3.6  AST 23  ALT 40  ALKPHOS 79  BILITOT 0.4   PT/INR No results for input(s): LABPROT, INR in the last 72 hours.  Studies/Results: CT ABDOMEN PELVIS W CONTRAST  Result Date: 01/07/2021 CLINICAL DATA:  ERCP earlier today, now with nausea and vomiting and acute abdominal pain. EXAM: CT ABDOMEN AND PELVIS WITH CONTRAST TECHNIQUE: Multidetector CT imaging of the abdomen and pelvis was performed using the standard  protocol following bolus administration of intravenous contrast. CONTRAST:  170mL OMNIPAQUE IOHEXOL 300 MG/ML  SOLN COMPARISON:  None FINDINGS: Lower chest: Mild bibasilar atelectasis. Impacted airway noted at the left lung base. The heart and pericardium are unremarkable. Hepatobiliary: No focal liver abnormality is seen. Status post cholecystectomy. No biliary dilatation. Pancreas: There is extensive peripancreatic inflammatory change and edema extending into the anterior pararenal spaces bilaterally. There is mild ascites present. The inflammatory changes encompass the entire pancreas. The pancreatic parenchyma diffusely demonstrates mild hypoenhancement with more focal hypoenhancement involving the head and uncinate process. No areas of frank non enhancement are identified, however, to suggest pancreatic necrosis. The pancreatic duct is not dilated. No pancreatic parenchymal calcifications. No loculated peripancreatic fluid collections are identified. Altogether, the findings are in keeping with moderate acute interstitial/edematous pancreatitis.  Spleen: Unremarkable Adrenals/Urinary Tract: Adrenal glands are unremarkable. Kidneys are normal, without renal calculi, focal lesion, or hydronephrosis. Bladder is unremarkable. Stomach/Bowel: Stomach is within normal limits. Appendix appears normal. No evidence of bowel wall thickening, distention, or inflammatory changes. No free intraperitoneal gas. Vascular/Lymphatic: The abdominal vasculature is unremarkable. Specifically, the splenic vein, superior mesenteric vein, and portal vein are patent. The celiac axis and proximal superior mesenteric artery are unremarkable. Circumaortic left renal vein is noted. No pathologic adenopathy within the abdomen and pelvis. Reproductive: Uterus and bilateral adnexa are unremarkable. Other: No abdominal wall hernia.  Rectum unremarkable. Musculoskeletal: No acute bone abnormality. IMPRESSION: Moderate, acute, edematous/interstitial pancreatitis. Mild ascites. Note there are areas of relative hypoenhancement within the head and uncinate process of the pancreas without frank necrosis noted at this time. Follow-up evaluation may be warranted to ensure continued viability of the pancreatic parenchyma. Electronically Signed   By: Fidela Salisbury MD   On: 01/07/2021 22:37    Impression and Plan Post ERCP pancreatitis ET shows moderate pancreatitis with mild ascites.   May need to repeat CT scan pending repeat lipase and liver function. At this time patient denies any fevers or chills. CBC has come down to 11.9 from 17.5 on admission. Continue supportive measures with IV fluids, liquid diet, pain control, Zofran GI will follow   LOS: 0 days   Vladimir Crofts  PA-C 01/08/2021, 8:50 AM  Contact #  [phone]

## 2021-01-08 NOTE — ED Notes (Signed)
Patient called out for being nauseous. Patient was given a cold wash cloth and heating packs for her back. I explained that there was not any medication due to be given at this time.

## 2021-01-08 NOTE — Progress Notes (Signed)
[facility] PROGRESS NOTE   Kimberly Kelly:[id number] DOB: Mar 27, 1971 DOA: 01/07/2021  PCP: [name], FNP  Brief History/Interval Summary: Female with a past medical history of pulmonary nodules, anxiety and depression who underwent ERCP at [facility] on the day she presented to the hospital.  This was done because of right upper quadrant abdominal pain.  History of cholecystectomy in 2018.  Following the ERCP patient developed abdominal pain.  She presented to the ED and was found to have elevated lipase level.  Diagnosed as acute pancreatitis and was hospitalized for further management.  Reason for Visit: Acute pancreatitis, post ERCP  Consultants: Gastroenterology  Procedures: None yet  Antibiotics: Anti-infectives (From admission, onward)   None      Subjective/Interval History: Patient mentions pain is 8 out of 10 in intensity.  Pain medicines not very effective at this time.  Has not had any vomiting since last night.  Feels very dry in her mouth.  Denies any chest pain.     Assessment/Plan:  Acute pancreatitis, post ERCP This was a result of ERCP complication.  Lipase level noted to be significantly elevated.  CT scan findings noted.  Continue supportive treatment.  Pain medications have been adjusted.  Continue aggressive IV fluids.  Recheck labs including lipase level tomorrow.  Urinary retention Apparently developed some retention in the ED.  Continue to monitor.  Bladder scan.  Hypokalemia Repleted.  Thrombocytosis Likely reactive.  Normal this morning.  History of depression and anxiety Continue Lexapro.  Obesity Estimated body mass index is 30.73 kg/m as calculated from the following:   Height as of 12/27/20: 5\' 7"  (1.702 m).   Weight as of 12/27/20: 89 kg.   DVT Prophylaxis: Lovenox Code Status: Full code Family Communication: Discussed with the patient.  No family at bedside Disposition Plan: Hopefully return home in  improved  Status is: Observation  The patient will require care spanning > 2 midnights and should be moved to inpatient because: Ongoing active pain requiring inpatient pain management  Dispo: The patient is from: Home              Anticipated d/c is to: Home              Patient currently is not medically stable to d/c.   Difficult to place patient No     Medications:  Scheduled: . enoxaparin (LOVENOX) injection  40 mg Subcutaneous Q24H  . escitalopram  10 mg Oral Daily  . senna  1 tablet Oral Daily   Continuous: . sodium chloride 200 mL/hr at 01/08/21 0020   WJX:BJYNWGNFAOZHY (DILAUDID) injection, metoCLOPramide (REGLAN) injection, ondansetron (ZOFRAN) IV   Objective:  Vital Signs  Vitals:   01/08/21 0500 01/08/21 0700 01/08/21 0730 01/08/21 0800  BP: (!) 143/89 116/67 117/79 135/88  Pulse: 73 60 77 98  Resp: 19 (!) 24 (!) 26 17  Temp:      TempSrc:      SpO2: 96% 98% 95% 96%    Intake/Output Summary (Last 24 hours) at 01/08/2021 1042 Last data filed at 01/08/2021 0021 Gross per 24 hour  Intake 3 ml  Output 275 ml  Net -272 ml   There were no vitals filed for this visit.  General appearance: Awake alert.  In no distress Resp: Clear to auscultation bilaterally.  Normal effort Cardio: S1-S2 is normal regular.  No S3-S4.  No rubs murmurs or bruit GI: Abdomen is soft.  Tender in the epigastric area with some guarding.  No  rebound rigidity.  Bowel sounds sluggish but present.    No masses organomegaly Extremities: No edema.  Full range of motion of lower extremities. Neurologic: Alert and oriented x3.  No focal neurological deficits.    Lab Results:  Data Reviewed: I have personally reviewed following labs and imaging studies  CBC: Recent Labs  Lab 01/07/21 2050 01/08/21 0500  WBC 17.5* 11.9*  NEUTROABS 15.5*  --   HGB 13.3 14.4  HCT 40.1 44.5  MCV 89.5 91.2  PLT 487* 433    Basic Metabolic Panel: Recent Labs  Lab 01/07/21 2050 01/08/21 0500  NA  142 142  K 3.3* 4.4  CL 108 111  CO2 27 24  GLUCOSE 148* 172*  BUN 11 9  CREATININE 0.79 0.69  CALCIUM 8.3* 8.1*    GFR: Estimated Creatinine Clearance: 96.4 mL/min (by C-G formula based on SCr of 0.69 mg/dL).  Liver Function Tests: Recent Labs  Lab 01/07/21 2050  AST 23  ALT 40  ALKPHOS 79  BILITOT 0.4  PROT 6.2*  ALBUMIN 3.6    Recent Labs  Lab 01/07/21 2050  LIPASE 4,925*    Recent Results (from the past 240 hour(s))  Resp Panel by RT-PCR (Flu A&B, Covid) Nasopharyngeal Swab     Status: None   Collection Time: 01/08/21  7:49 AM   Specimen: Nasopharyngeal Swab; Nasopharyngeal(NP) swabs in vial transport medium  Result Value Ref Range Status   SARS Coronavirus 2 by RT PCR NEGATIVE NEGATIVE Final    Comment: (NOTE) SARS-CoV-2 target nucleic acids are NOT DETECTED.  The SARS-CoV-2 RNA is generally detectable in upper respiratory specimens during the acute phase of infection. The lowest concentration of SARS-CoV-2 viral copies this assay can detect is 138 copies/mL. A negative result does not preclude SARS-Cov-2 infection and should not be used as the sole basis for treatment or other patient management decisions. A negative result may occur with  improper specimen collection/handling, submission of specimen other than nasopharyngeal swab, presence of viral mutation(s) within the areas targeted by this assay, and inadequate number of viral copies(<138 copies/mL). A negative result must be combined with clinical observations, patient history, and epidemiological information. The expected result is Negative.  Fact Sheet for Patients:  EntrepreneurPulse.com.au  Fact Sheet for Healthcare Providers:  IncredibleEmployment.be  This test is no t yet approved or cleared by the [place] FDA and  has been authorized for detection and/or diagnosis of SARS-CoV-2 by FDA under an Emergency Use Authorization (EUA). This EUA will  remain  in effect (meaning this test can be used) for the duration of the COVID-19 declaration under Section 564(b)(1) of the Act, 21 U.S.C.section 360bbb-3(b)(1), unless the authorization is terminated  or revoked sooner.       Influenza A by PCR NEGATIVE NEGATIVE Final   Influenza B by PCR NEGATIVE NEGATIVE Final    Comment: (NOTE) The Xpert Xpress SARS-CoV-2/FLU/RSV plus assay is intended as an aid in the diagnosis of influenza from Nasopharyngeal swab specimens and should not be used as a sole basis for treatment. Nasal washings and aspirates are unacceptable for Xpert Xpress SARS-CoV-2/FLU/RSV testing.  Fact Sheet for Patients: EntrepreneurPulse.com.au  Fact Sheet for Healthcare Providers: IncredibleEmployment.be  This test is not yet approved or cleared by the [place] FDA and has been authorized for detection and/or diagnosis of SARS-CoV-2 by FDA under an Emergency Use Authorization (EUA). This EUA will remain in effect (meaning this test can be used) for the duration of the COVID-19 declaration under Section 564(b)(1)  of the Act, 21 U.S.C. section 360bbb-3(b)(1), unless the authorization is terminated or revoked.  Performed at [facility] [place] [address] [place], [place] [address]       Radiology Studies: CT ABDOMEN PELVIS W CONTRAST  Result Date: 01/07/2021 CLINICAL DATA:  ERCP earlier today, now with nausea and vomiting and acute abdominal pain. EXAM: CT ABDOMEN AND PELVIS WITH CONTRAST TECHNIQUE: Multidetector CT imaging of the abdomen and pelvis was performed using the standard protocol following bolus administration of intravenous contrast. CONTRAST:  160mL OMNIPAQUE IOHEXOL 300 MG/ML  SOLN COMPARISON:  None FINDINGS: Lower chest: Mild bibasilar atelectasis. Impacted airway noted at the left lung base. The heart and pericardium are unremarkable. Hepatobiliary: No focal liver abnormality is seen. Status  post cholecystectomy. No biliary dilatation. Pancreas: There is extensive peripancreatic inflammatory change and edema extending into the anterior pararenal spaces bilaterally. There is mild ascites present. The inflammatory changes encompass the entire pancreas. The pancreatic parenchyma diffusely demonstrates mild hypoenhancement with more focal hypoenhancement involving the head and uncinate process. No areas of frank non enhancement are identified, however, to suggest pancreatic necrosis. The pancreatic duct is not dilated. No pancreatic parenchymal calcifications. No loculated peripancreatic fluid collections are identified. Altogether, the findings are in keeping with moderate acute interstitial/edematous pancreatitis. Spleen: Unremarkable Adrenals/Urinary Tract: Adrenal glands are unremarkable. Kidneys are normal, without renal calculi, focal lesion, or hydronephrosis. Bladder is unremarkable. Stomach/Bowel: Stomach is within normal limits. Appendix appears normal. No evidence of bowel wall thickening, distention, or inflammatory changes. No free intraperitoneal gas. Vascular/Lymphatic: The abdominal vasculature is unremarkable. Specifically, the splenic vein, superior mesenteric vein, and portal vein are patent. The celiac axis and proximal superior mesenteric artery are unremarkable. Circumaortic left renal vein is noted. No pathologic adenopathy within the abdomen and pelvis. Reproductive: Uterus and bilateral adnexa are unremarkable. Other: No abdominal wall hernia.  Rectum unremarkable. Musculoskeletal: No acute bone abnormality. IMPRESSION: Moderate, acute, edematous/interstitial pancreatitis. Mild ascites. Note there are areas of relative hypoenhancement within the head and uncinate process of the pancreas without frank necrosis noted at this time. Follow-up evaluation may be warranted to ensure continued viability of the pancreatic parenchyma. Electronically Signed   By: Fidela Salisbury MD   On:  01/07/2021 22:37       LOS: 0 days   [facility] Pager on [email or web address]  01/08/2021, 10:42 AM

## 2021-01-08 NOTE — ED Notes (Signed)
Pt requesting more pain medication. Nurse explained to pt the potential risk of overdose and that pain medication would need to be taken on a strict schedule. Pt continued to request more pain medication.

## 2021-01-09 ENCOUNTER — Inpatient Hospital Stay (HOSPITAL_COMMUNITY): Payer: BC Managed Care – PPO

## 2021-01-09 DIAGNOSIS — K858 Other acute pancreatitis without necrosis or infection: Secondary | ICD-10-CM | POA: Diagnosis not present

## 2021-01-09 DIAGNOSIS — D72829 Elevated white blood cell count, unspecified: Secondary | ICD-10-CM | POA: Diagnosis not present

## 2021-01-09 LAB — COMPREHENSIVE METABOLIC PANEL
ALT: 39 U/L (ref 0–44)
AST: 30 U/L (ref 15–41)
Albumin: 3 g/dL — ABNORMAL LOW (ref 3.5–5.0)
Alkaline Phosphatase: 86 U/L (ref 38–126)
Anion gap: 7 (ref 5–15)
BUN: 15 mg/dL (ref 6–20)
CO2: 24 mmol/L (ref 22–32)
Calcium: 7.8 mg/dL — ABNORMAL LOW (ref 8.9–10.3)
Chloride: 112 mmol/L — ABNORMAL HIGH (ref 98–111)
Creatinine, Ser: 0.9 mg/dL (ref 0.44–1.00)
GFR, Estimated: >60 mL/min (ref >60)
Glucose, Bld: 141 mg/dL — ABNORMAL HIGH (ref 70–99)
Potassium: 4.2 mmol/L (ref 3.5–5.1)
Sodium: 143 mmol/L (ref 135–145)
Total Bilirubin: 1.1 mg/dL (ref 0.3–1.2)
Total Protein: 5.7 g/dL — ABNORMAL LOW (ref 6.5–8.1)

## 2021-01-09 LAB — CBC
HCT: 44.3 % (ref 36.0–46.0)
Hemoglobin: 14.1 g/dL (ref 12.0–15.0)
MCH: 29.4 pg (ref 26.0–34.0)
MCHC: 31.8 g/dL (ref 30.0–36.0)
MCV: 92.5 fL (ref 80.0–100.0)
Platelets: 327 10*3/uL (ref 150–400)
RBC: 4.79 MIL/uL (ref 3.87–5.11)
RDW: 14.5 % (ref 11.5–15.5)
WBC: 16.7 10*3/uL — ABNORMAL HIGH (ref 4.0–10.5)
nRBC: 0 % (ref 0.0–0.2)

## 2021-01-09 LAB — LIPASE, BLOOD: Lipase: 673 U/L — ABNORMAL HIGH (ref 11–51)

## 2021-01-09 LAB — MAGNESIUM: Magnesium: 1.7 mg/dL (ref 1.7–2.4)

## 2021-01-09 MED ORDER — POLYETHYLENE GLYCOL 3350 17 G PO PACK: 17.0000 g | PACK | Freq: Two times a day (BID) | ORAL | Status: DC | PRN

## 2021-01-09 MED ORDER — PANTOPRAZOLE SODIUM 40 MG PO TBEC
40.0000 mg | DELAYED_RELEASE_TABLET | Freq: Two times a day (BID) | ORAL | Status: DC
  Administered 2021-01-09 – 2021-01-10 (×2): 40 mg via ORAL
  Filled 2021-01-09 (×2): qty 1

## 2021-01-09 MED ORDER — SODIUM CHLORIDE 0.9 % IV BOLUS
250.0000 mL | Freq: Once | INTRAVENOUS | Status: AC
  Administered 2021-01-09: 250 mL via INTRAVENOUS

## 2021-01-09 MED ORDER — PANTOPRAZOLE SODIUM 40 MG PO TBEC
40.0000 mg | DELAYED_RELEASE_TABLET | Freq: Every day | ORAL | Status: DC
  Administered 2021-01-09: 40 mg via ORAL
  Filled 2021-01-09: qty 1

## 2021-01-09 MED ORDER — KETOROLAC TROMETHAMINE 15 MG/ML IJ SOLN
15.0000 mg | Freq: Four times a day (QID) | INTRAMUSCULAR | Status: DC | PRN
  Administered 2021-01-09 (×3): 15 mg via INTRAVENOUS
  Filled 2021-01-09 (×3): qty 1

## 2021-01-09 MED ORDER — METOPROLOL TARTRATE 5 MG/5ML IV SOLN
2.5000 mg | Freq: Once | INTRAVENOUS | Status: AC
  Administered 2021-01-09: 2.5 mg via INTRAVENOUS
  Filled 2021-01-09: qty 5

## 2021-01-09 NOTE — Progress Notes (Signed)
[facility] Gastroenterology Progress Note  Kimberly Kelly 50 y.o. [birth year]  CC: ERCP pancreatitis   Subjective: Patient with continuing pain over night, has has some nausea, burping/reflux but no vomiting. No BM since Tuesday. Also feels it is difficult to urinate.  She continues to have AB pain diffuse, pain medications are helping.   ROS : Review of Systems  Constitutional:  Negative for chills and fever.  HENT:  Negative for hearing loss.   Eyes:  Negative for redness.  Respiratory:  Negative for shortness of breath.   Cardiovascular:  Negative for chest pain and leg swelling.  Gastrointestinal:  Positive for abdominal pain, constipation, heartburn and nausea. Negative for blood in stool, diarrhea, melena and vomiting.  Genitourinary:  Negative for flank pain.       Difficult to urinate  Musculoskeletal:  Negative for falls.  Skin:  Negative for itching.  Neurological:  Negative for loss of consciousness.  Psychiatric/Behavioral:  Negative for memory loss.      Objective: Vital signs in last 24 hours: Vitals:   01/09/21 0222 01/09/21 0505  BP: 107/77 121/80  Pulse: (!) 126 (!) 123  Resp: 18 20  Temp: 97.8 F (36.6 C) 97.7 F (36.5 C)  SpO2: 99% 96%    Physical Exam: Physical Exam Constitutional:      Appearance: Normal appearance. She is not ill-appearing.     Comments: Appears more comfortable today than yesterday, sitting up in bed  Eyes:     General: No scleral icterus. Abdominal:     General: Bowel sounds are decreased. There is distension.     Palpations: Abdomen is soft. There is no mass.     Tenderness: There is abdominal tenderness in the epigastric area and suprapubic area.     Comments: Bladder distention  Musculoskeletal:        General: Normal range of motion.  Skin:    General: Skin is warm.     Coloration: Skin is not jaundiced.  Neurological:     General: No focal deficit present.     Mental Status: She is alert and oriented to person, place,  and time.  Psychiatric:        Mood and Affect: Mood normal.        Behavior: Behavior normal.     Lab Results: Recent Labs    01/08/21 0500 01/09/21 0556  NA 142 143  K 4.4 4.2  CL 111 112*  CO2 24 24  GLUCOSE 172* 141*  BUN 9 15  CREATININE 0.69 0.90  CALCIUM 8.1* 7.8*  MG  --  1.7   Recent Labs    01/07/21 2050 01/09/21 0556  AST 23 30  ALT 40 39  ALKPHOS 79 86  BILITOT 0.4 1.1  PROT 6.2* 5.7*  ALBUMIN 3.6 3.0*   Recent Labs    01/07/21 2050 01/08/21 0500 01/09/21 0556  WBC 17.5* 11.9* 16.7*  NEUTROABS 15.5*  --   --   HGB 13.3 14.4 14.1  HCT 40.1 44.5 44.3  MCV 89.5 91.2 92.5  PLT 487* 368 327   No results for input(s): LABPROT, INR in the last 72 hours.  Lab Results: Results for orders placed or performed during the hospital encounter of 01/07/21 (from the past 48 hour(s))  CBC with Differential/Platelet     Status: Abnormal   Collection Time: 01/07/21  8:50 PM  Result Value Ref Range   WBC 17.5 (H) 4.0 - 10.5 K/uL   RBC 4.48 3.87 - 5.11 MIL/uL  Hemoglobin 13.3 12.0 - 15.0 g/dL   HCT 40.1 36.0 - 46.0 %   MCV 89.5 80.0 - 100.0 fL   MCH 29.7 26.0 - 34.0 pg   MCHC 33.2 30.0 - 36.0 g/dL   RDW 13.5 11.5 - 15.5 %   Platelets 487 (H) 150 - 400 K/uL   nRBC 0.0 0.0 - 0.2 %   Neutrophils Relative % 88 %   Neutro Abs 15.5 (H) 1.7 - 7.7 K/uL   Lymphocytes Relative 7 %   Lymphs Abs 1.2 0.7 - 4.0 K/uL   Monocytes Relative 3 %   Monocytes Absolute 0.5 0.1 - 1.0 K/uL   Eosinophils Relative 1 %   Eosinophils Absolute 0.1 0.0 - 0.5 K/uL   Basophils Relative 0 %   Basophils Absolute 0.1 0.0 - 0.1 K/uL   Immature Granulocytes 1 %   Abs Immature Granulocytes 0.09 (H) 0.00 - 0.07 K/uL    Comment: Performed at [facility], [place] [address], [place], [place] [address]  Comprehensive metabolic panel     Status: Abnormal   Collection Time: 01/07/21  8:50 PM  Result Value Ref Range   Sodium 142 135 - 145 mmol/L   Potassium 3.3 (L) 3.5 - 5.1  mmol/L   Chloride 108 98 - 111 mmol/L   CO2 27 22 - 32 mmol/L   Glucose, Bld 148 (H) 70 - 99 mg/dL    Comment: Glucose reference range applies only to samples taken after fasting for at least 8 hours.   BUN 11 6 - 20 mg/dL   Creatinine, Ser 0.79 0.44 - 1.00 mg/dL   Calcium 8.3 (L) 8.9 - 10.3 mg/dL   Total Protein 6.2 (L) 6.5 - 8.1 g/dL   Albumin 3.6 3.5 - 5.0 g/dL   AST 23 15 - 41 U/L   ALT 40 0 - 44 U/L   Alkaline Phosphatase 79 38 - 126 U/L   Total Bilirubin 0.4 0.3 - 1.2 mg/dL   GFR, Estimated >60 >60 mL/min    Comment: (NOTE) Calculated using the CKD-EPI Creatinine Equation (2021)    Anion gap 7 5 - 15    Comment: Performed at [facility], [place] [address], [place], [place] [address]  Lipase, blood     Status: Abnormal   Collection Time: 01/07/21  8:50 PM  Result Value Ref Range   Lipase 4,925 (H) 11 - 51 U/L    Comment: RESULTS CONFIRMED BY MANUAL DILUTION Performed at [facility], [place] [address] [place], [place] [address]   Basic metabolic panel     Status: Abnormal   Collection Time: 01/08/21  5:00 AM  Result Value Ref Range   Sodium 142 135 - 145 mmol/L   Potassium 4.4 3.5 - 5.1 mmol/L    Comment: DELTA CHECK NOTED   Chloride 111 98 - 111 mmol/L   CO2 24 22 - 32 mmol/L   Glucose, Bld 172 (H) 70 - 99 mg/dL    Comment: Glucose reference range applies only to samples taken after fasting for at least 8 hours.   BUN 9 6 - 20 mg/dL   Creatinine, Ser 0.69 0.44 - 1.00 mg/dL   Calcium 8.1 (L) 8.9 - 10.3 mg/dL   GFR, Estimated >60 >60 mL/min    Comment: (NOTE) Calculated using the CKD-EPI Creatinine Equation (2021)    Anion gap 7 5 - 15    Comment: Performed at [facility], [place] [address], [place], [place] [address]  CBC     Status: Abnormal   Collection Time: 01/08/21  5:00 AM  Result Value Ref Range   WBC 11.9 (H) 4.0 - 10.5 K/uL   RBC 4.88 3.87 - 5.11 MIL/uL   Hemoglobin 14.4 12.0 - 15.0 g/dL   HCT 44.5  36.0 - 46.0 %   MCV 91.2 80.0 - 100.0 fL   MCH 29.5 26.0 - 34.0 pg   MCHC 32.4 30.0 - 36.0 g/dL   RDW 13.8 11.5 - 15.5 %   Platelets 368 150 - 400 K/uL   nRBC 0.0 0.0 - 0.2 %    Comment: Performed at [facility], [place] [address], [place], [place] [address]  Resp Panel by RT-PCR (Flu A&B, Covid) Nasopharyngeal Swab     Status: None   Collection Time: 01/08/21  7:49 AM   Specimen: Nasopharyngeal Swab; Nasopharyngeal(NP) swabs in vial transport medium  Result Value Ref Range   SARS Coronavirus 2 by RT PCR NEGATIVE NEGATIVE    Comment: (NOTE) SARS-CoV-2 target nucleic acids are NOT DETECTED.  The SARS-CoV-2 RNA is generally detectable in upper respiratory specimens during the acute phase of infection. The lowest concentration of SARS-CoV-2 viral copies this assay can detect is 138 copies/mL. A negative result does not preclude SARS-Cov-2 infection and should not be used as the sole basis for treatment or other patient management decisions. A negative result may occur with  improper specimen collection/handling, submission of specimen other than nasopharyngeal swab, presence of viral mutation(s) within the areas targeted by this assay, and inadequate number of viral copies(<138 copies/mL). A negative result must be combined with clinical observations, patient history, and epidemiological information. The expected result is Negative.  Fact Sheet for Patients:  EntrepreneurPulse.com.au  Fact Sheet for Healthcare Providers:  IncredibleEmployment.be  This test is no t yet approved or cleared by the [place] FDA and  has been authorized for detection and/or diagnosis of SARS-CoV-2 by FDA under an Emergency Use Authorization (EUA). This EUA will remain  in effect (meaning this test can be used) for the duration of the COVID-19 declaration under Section 564(b)(1) of the Act, 21 U.S.C.section 360bbb-3(b)(1), unless the  authorization is terminated  or revoked sooner.       Influenza A by PCR NEGATIVE NEGATIVE   Influenza B by PCR NEGATIVE NEGATIVE    Comment: (NOTE) The Xpert Xpress SARS-CoV-2/FLU/RSV plus assay is intended as an aid in the diagnosis of influenza from Nasopharyngeal swab specimens and should not be used as a sole basis for treatment. Nasal washings and aspirates are unacceptable for Xpert Xpress SARS-CoV-2/FLU/RSV testing.  Fact Sheet for Patients: EntrepreneurPulse.com.au  Fact Sheet for Healthcare Providers: IncredibleEmployment.be  This test is not yet approved or cleared by the [place] FDA and has been authorized for detection and/or diagnosis of SARS-CoV-2 by FDA under an Emergency Use Authorization (EUA). This EUA will remain in effect (meaning this test can be used) for the duration of the COVID-19 declaration under Section 564(b)(1) of the Act, 21 U.S.C. section 360bbb-3(b)(1), unless the authorization is terminated or revoked.  Performed at [facility], [place] [address], [place], [place] [address]   CBC     Status: Abnormal   Collection Time: 01/09/21  5:56 AM  Result Value Ref Range   WBC 16.7 (H) 4.0 - 10.5 K/uL   RBC 4.79 3.87 - 5.11 MIL/uL   Hemoglobin 14.1 12.0 - 15.0 g/dL   HCT 44.3 36.0 - 46.0 %   MCV 92.5 80.0 -  100.0 fL   MCH 29.4 26.0 - 34.0 pg   MCHC 31.8 30.0 - 36.0 g/dL   RDW 14.5 11.5 - 15.5 %   Platelets 327 150 - 400 K/uL   nRBC 0.0 0.0 - 0.2 %    Comment: Performed at [facility], [place] [address], [place], [place] [address]  Comprehensive metabolic panel     Status: Abnormal   Collection Time: 01/09/21  5:56 AM  Result Value Ref Range   Sodium 143 135 - 145 mmol/L   Potassium 4.2 3.5 - 5.1 mmol/L   Chloride 112 (H) 98 - 111 mmol/L   CO2 24 22 - 32 mmol/L   Glucose, Bld 141 (H) 70 - 99 mg/dL    Comment: Glucose reference range applies only to samples taken after  fasting for at least 8 hours.   BUN 15 6 - 20 mg/dL   Creatinine, Ser 0.90 0.44 - 1.00 mg/dL   Calcium 7.8 (L) 8.9 - 10.3 mg/dL   Total Protein 5.7 (L) 6.5 - 8.1 g/dL   Albumin 3.0 (L) 3.5 - 5.0 g/dL   AST 30 15 - 41 U/L   ALT 39 0 - 44 U/L   Alkaline Phosphatase 86 38 - 126 U/L   Total Bilirubin 1.1 0.3 - 1.2 mg/dL   GFR, Estimated >60 >60 mL/min    Comment: (NOTE) Calculated using the CKD-EPI Creatinine Equation (2021)    Anion gap 7 5 - 15    Comment: Performed at [facility], [place] [address] [place], [place] [address]  Lipase, blood     Status: Abnormal   Collection Time: 01/09/21  5:56 AM  Result Value Ref Range   Lipase 673 (H) 11 - 51 U/L    Comment: RESULTS CONFIRMED BY MANUAL DILUTION Performed at [facility], [place] [address], [place], [place] [address]   Magnesium     Status: None   Collection Time: 01/09/21  5:56 AM  Result Value Ref Range   Magnesium 1.7 1.7 - 2.4 mg/dL    Comment: Performed at [facility], [place] [address] [place], [place] [address]    Studies/Results: CT ABDOMEN PELVIS W CONTRAST  Result Date: 01/07/2021 CLINICAL DATA:  ERCP earlier today, now with nausea and vomiting and acute abdominal pain. EXAM: CT ABDOMEN AND PELVIS WITH CONTRAST TECHNIQUE: Multidetector CT imaging of the abdomen and pelvis was performed using the standard protocol following bolus administration of intravenous contrast. CONTRAST:  184mL OMNIPAQUE IOHEXOL 300 MG/ML  SOLN COMPARISON:  None FINDINGS: Lower chest: Mild bibasilar atelectasis. Impacted airway noted at the left lung base. The heart and pericardium are unremarkable. Hepatobiliary: No focal liver abnormality is seen. Status post cholecystectomy. No biliary dilatation. Pancreas: There is extensive peripancreatic inflammatory change and edema extending into the anterior pararenal spaces bilaterally. There is mild ascites present. The inflammatory changes encompass the  entire pancreas. The pancreatic parenchyma diffusely demonstrates mild hypoenhancement with more focal hypoenhancement involving the head and uncinate process. No areas of frank non enhancement are identified, however, to suggest pancreatic necrosis. The pancreatic duct is not dilated. No pancreatic parenchymal calcifications. No loculated peripancreatic fluid collections are identified. Altogether, the findings are in keeping with moderate acute interstitial/edematous pancreatitis. Spleen: Unremarkable Adrenals/Urinary Tract: Adrenal glands are unremarkable. Kidneys are normal, without renal calculi, focal lesion, or hydronephrosis. Bladder is unremarkable. Stomach/Bowel: Stomach is within normal limits. Appendix appears normal. No evidence of bowel wall thickening, distention, or inflammatory changes. No free intraperitoneal  gas. Vascular/Lymphatic: The abdominal vasculature is unremarkable. Specifically, the splenic vein, superior mesenteric vein, and portal vein are patent. The celiac axis and proximal superior mesenteric artery are unremarkable. Circumaortic left renal vein is noted. No pathologic adenopathy within the abdomen and pelvis. Reproductive: Uterus and bilateral adnexa are unremarkable. Other: No abdominal wall hernia.  Rectum unremarkable. Musculoskeletal: No acute bone abnormality. IMPRESSION: Moderate, acute, edematous/interstitial pancreatitis. Mild ascites. Note there are areas of relative hypoenhancement within the head and uncinate process of the pancreas without frank necrosis noted at this time. Follow-up evaluation may be warranted to ensure continued viability of the pancreatic parenchyma. Electronically Signed   By: Fidela Salisbury MD   On: 01/07/2021 22:37    Assessment/Plan: Post ERCP pancreatitis CBC is back up from 11.9 to 16.  BUN has slight increase but stable Lipase is improving from 4000's to 600.  Continue supportive measures, IV fluids, liquid diet, pain control and  zofran.  Continue protonix daily Continue reglan as needed.   Constipation Last BM Tuesday, likely ileus from inflammation, continue supportive measures.   Suprapubic tenderness Nurse will bladder scan, may need I&O cath, hospitalist to follow    Vladimir Crofts PA-C 01/09/2021, 10:44 AM  Contact #  [phone]

## 2021-01-09 NOTE — Progress Notes (Signed)
[facility] PROGRESS NOTE   Kimberly Kelly GNF:[id number] DOB: [birth year] DOA: 01/07/2021  PCP: [name], FNP  Brief History/Interval Summary: Female with a past medical history of pulmonary nodules, anxiety and depression who underwent ERCP at [facility] on the day she presented to the hospital.  This was done because of right upper quadrant abdominal pain.  History of cholecystectomy in 2018.  Following the ERCP patient developed abdominal pain.  She presented to the ED and was found to have elevated lipase level.  Diagnosed as acute pancreatitis and was hospitalized for further management.  Reason for Visit: Acute pancreatitis, post ERCP  Consultants: Gastroenterology  Procedures: None yet  Antibiotics: Anti-infectives (From admission, onward)    None       Subjective/Interval History: Patient states that pain is slightly better today compared to last night.  7 out of 10 in intensity.  Denies any nausea vomiting.  Denies any shortness of breath although she is noted to be on oxygen this morning.  Not passing any gas from below.  Mentions that she is experiencing some acid reflux.  Tolerating her liquids.       Assessment/Plan:  Acute pancreatitis, post ERCP This was due to complication of ERCP.  Lipase level was noted to be significantly elevated.  CT findings noted.  Gastroenterology is following.  Continue pain medications and IV fluids.  Remains on clear liquid diet.   Lipase level noted to be 673 today compared to 4925 at the time of admission. Noted to be on oxygen this morning but she denies any shortness of breath.  Incentive spirometry will be ordered.  Monitor respiratory status closely.  She is also noted to be little bit more tachycardic today.    Urinary retention Apparently developed some retention in the ED. none since then.  Has been urinating.    Hypokalemia Repleted.  Thrombocytosis/leukocytosis Elevated platelet counts were noted at the  time of admission.  Likely reactive.  Now normal. Elevated WBC likely due to acute inflammation.  Noted to be afebrile.  History of depression and anxiety Continue Lexapro.  Obesity Estimated body mass index is 30.73 kg/m as calculated from the following:   Height as of 12/27/20: 5\' 7"  (1.702 m).   Weight as of 12/27/20: 89 kg.   DVT Prophylaxis: Lovenox Code Status: Full code Family Communication: Discussed with the patient.  No family at bedside Disposition Plan: Hopefully return home in improved.  Status is: Inpatient  Remains inpatient appropriate because:Ongoing active pain requiring inpatient pain management and IV treatments appropriate due to intensity of illness or inability to take PO  Dispo:  Patient From: Home  Planned Disposition: Home  Medically stable for discharge: No         Medications:  Scheduled:  enoxaparin (LOVENOX) injection  40 mg Subcutaneous Q24H   escitalopram  10 mg Oral Daily   pantoprazole  40 mg Oral Daily   senna  1 tablet Oral Daily   Continuous:  sodium chloride 150 mL (01/09/21 0726)   QIO:[id number] (DILAUDID) injection, ketorolac, metoCLOPramide (REGLAN) injection, ondansetron (ZOFRAN) IV   Objective:  Vital Signs  Vitals:   01/08/21 2000 01/09/21 0025 01/09/21 0222 01/09/21 0505  BP: 101/69 122/86 107/77 121/80  Pulse: (!) 110 (!) 136 (!) 126 (!) 123  Resp: 18 18 18 20   Temp: 97.9 F (36.6 C) 97.8 F (36.6 C) 97.8 F (36.6 C) 97.7 F (36.5 C)  TempSrc: Axillary Oral Oral Oral  SpO2: 94% 97% 99% 96%  Intake/Output Summary (Last 24 hours) at 01/09/2021 0914 Last data filed at 01/09/2021 5093 Gross per 24 hour  Intake 3020 ml  Output 400 ml  Net 2620 ml    There were no vitals filed for this visit.  General appearance: Awake alert.  In no distress Resp: Normal effort.  Diminished air entry at the bases.  No wheezing or rhonchi.  Few crackles. Cardio: S1-S2 is tachycardic regular.  No S3-S4.  No rubs murmurs  or bruit GI: Abdomen is soft.  Tenderness in the epigastric area without any rebound LTT.  Mild guarding.  Sluggish bowel sounds.  No masses organomegaly.   Extremities: No edema.  Full range of motion of lower extremities. Neurologic: Alert and oriented x3.  No focal neurological deficits.     Lab Results:  Data Reviewed: I have personally reviewed following labs and imaging studies  CBC: Recent Labs  Lab 01/07/21 2050 01/08/21 0500 01/09/21 0556  WBC 17.5* 11.9* 16.7*  NEUTROABS 15.5*  --   --   HGB 13.3 14.4 14.1  HCT 40.1 44.5 44.3  MCV 89.5 91.2 92.5  PLT 487* 368 327     Basic Metabolic Panel: Recent Labs  Lab 01/07/21 2050 01/08/21 0500 01/09/21 0556  NA 142 142 143  K 3.3* 4.4 4.2  CL 108 111 112*  CO2 27 24 24   GLUCOSE 148* 172* 141*  BUN 11 9 15   CREATININE 0.79 0.69 0.90  CALCIUM 8.3* 8.1* 7.8*  MG  --   --  1.7     GFR: Estimated Creatinine Clearance: 85.7 mL/min (by C-G formula based on SCr of 0.9 mg/dL).  Liver Function Tests: Recent Labs  Lab 01/07/21 2050 01/09/21 0556  AST 23 30  ALT 40 39  ALKPHOS 79 86  BILITOT 0.4 1.1  PROT 6.2* 5.7*  ALBUMIN 3.6 3.0*     Recent Labs  Lab 01/07/21 2050 01/09/21 0556  LIPASE 4,925* 673*     Recent Results (from the past 240 hour(s))  Resp Panel by RT-PCR (Flu A&B, Covid) Nasopharyngeal Swab     Status: None   Collection Time: 01/08/21  7:49 AM   Specimen: Nasopharyngeal Swab; Nasopharyngeal(NP) swabs in vial transport medium  Result Value Ref Range Status   SARS Coronavirus 2 by RT PCR NEGATIVE NEGATIVE Final    Comment: (NOTE) SARS-CoV-2 target nucleic acids are NOT DETECTED.  The SARS-CoV-2 RNA is generally detectable in upper respiratory specimens during the acute phase of infection. The lowest concentration of SARS-CoV-2 viral copies this assay can detect is 138 copies/mL. A negative result does not preclude SARS-Cov-2 infection and should not be used as the sole basis for  treatment or other patient management decisions. A negative result may occur with  improper specimen collection/handling, submission of specimen other than nasopharyngeal swab, presence of viral mutation(s) within the areas targeted by this assay, and inadequate number of viral copies(<138 copies/mL). A negative result must be combined with clinical observations, patient history, and epidemiological information. The expected result is Negative.  Fact Sheet for Patients:  EntrepreneurPulse.com.au  Fact Sheet for Healthcare Providers:  IncredibleEmployment.be  This test is no t yet approved or cleared by the [place] FDA and  has been authorized for detection and/or diagnosis of SARS-CoV-2 by FDA under an Emergency Use Authorization (EUA). This EUA will remain  in effect (meaning this test can be used) for the duration of the COVID-19 declaration under Section 564(b)(1) of the Act, 21 U.S.C.section 360bbb-3(b)(1), unless the authorization is terminated  or revoked sooner.       Influenza A by PCR NEGATIVE NEGATIVE Final   Influenza B by PCR NEGATIVE NEGATIVE Final    Comment: (NOTE) The Xpert Xpress SARS-CoV-2/FLU/RSV plus assay is intended as an aid in the diagnosis of influenza from Nasopharyngeal swab specimens and should not be used as a sole basis for treatment. Nasal washings and aspirates are unacceptable for Xpert Xpress SARS-CoV-2/FLU/RSV testing.  Fact Sheet for Patients: EntrepreneurPulse.com.au  Fact Sheet for Healthcare Providers: IncredibleEmployment.be  This test is not yet approved or cleared by the [place] FDA and has been authorized for detection and/or diagnosis of SARS-CoV-2 by FDA under an Emergency Use Authorization (EUA). This EUA will remain in effect (meaning this test can be used) for the duration of the COVID-19 declaration under Section 564(b)(1) of the Act, 21  U.S.C. section 360bbb-3(b)(1), unless the authorization is terminated or revoked.  Performed at [facility], [place] [address], [place], [place] [address]       Radiology Studies: CT ABDOMEN PELVIS W CONTRAST  Result Date: 01/07/2021 CLINICAL DATA:  ERCP earlier today, now with nausea and vomiting and acute abdominal pain. EXAM: CT ABDOMEN AND PELVIS WITH CONTRAST TECHNIQUE: Multidetector CT imaging of the abdomen and pelvis was performed using the standard protocol following bolus administration of intravenous contrast. CONTRAST:  175mL OMNIPAQUE IOHEXOL 300 MG/ML  SOLN COMPARISON:  None FINDINGS: Lower chest: Mild bibasilar atelectasis. Impacted airway noted at the left lung base. The heart and pericardium are unremarkable. Hepatobiliary: No focal liver abnormality is seen. Status post cholecystectomy. No biliary dilatation. Pancreas: There is extensive peripancreatic inflammatory change and edema extending into the anterior pararenal spaces bilaterally. There is mild ascites present. The inflammatory changes encompass the entire pancreas. The pancreatic parenchyma diffusely demonstrates mild hypoenhancement with more focal hypoenhancement involving the head and uncinate process. No areas of frank non enhancement are identified, however, to suggest pancreatic necrosis. The pancreatic duct is not dilated. No pancreatic parenchymal calcifications. No loculated peripancreatic fluid collections are identified. Altogether, the findings are in keeping with moderate acute interstitial/edematous pancreatitis. Spleen: Unremarkable Adrenals/Urinary Tract: Adrenal glands are unremarkable. Kidneys are normal, without renal calculi, focal lesion, or hydronephrosis. Bladder is unremarkable. Stomach/Bowel: Stomach is within normal limits. Appendix appears normal. No evidence of bowel wall thickening, distention, or inflammatory changes. No free intraperitoneal gas. Vascular/Lymphatic: The abdominal  vasculature is unremarkable. Specifically, the splenic vein, superior mesenteric vein, and portal vein are patent. The celiac axis and proximal superior mesenteric artery are unremarkable. Circumaortic left renal vein is noted. No pathologic adenopathy within the abdomen and pelvis. Reproductive: Uterus and bilateral adnexa are unremarkable. Other: No abdominal wall hernia.  Rectum unremarkable. Musculoskeletal: No acute bone abnormality. IMPRESSION: Moderate, acute, edematous/interstitial pancreatitis. Mild ascites. Note there are areas of relative hypoenhancement within the head and uncinate process of the pancreas without frank necrosis noted at this time. Follow-up evaluation may be warranted to ensure continued viability of the pancreatic parenchyma. Electronically Signed   By: Fidela Salisbury MD   On: 01/07/2021 22:37        LOS: 1 day   Bonnielee Haff  [facility] Pager on [email or web address]  01/09/2021, 9:14 AM

## 2021-01-09 NOTE — Plan of Care (Signed)
Pt's acute epigastric pain well managed with Rx; pulse went down after Rx. Pt ambulated to bathroom with assist; now resting comfortably in bed with call light within reach and bed at lowest position for safety; no s/s of acute distress reported or observed.

## 2021-01-10 ENCOUNTER — Encounter: Payer: BC Managed Care – PPO | Admitting: Thoracic Surgery (Cardiothoracic Vascular Surgery)

## 2021-01-10 ENCOUNTER — Other Ambulatory Visit: Payer: Self-pay

## 2021-01-10 DIAGNOSIS — K858 Other acute pancreatitis without necrosis or infection: Secondary | ICD-10-CM | POA: Diagnosis not present

## 2021-01-10 DIAGNOSIS — D72829 Elevated white blood cell count, unspecified: Secondary | ICD-10-CM | POA: Diagnosis not present

## 2021-01-10 LAB — CBC
HCT: 43.6 % (ref 36.0–46.0)
Hemoglobin: 13.9 g/dL (ref 12.0–15.0)
MCH: 29.3 pg (ref 26.0–34.0)
MCHC: 31.9 g/dL (ref 30.0–36.0)
MCV: 92 fL (ref 80.0–100.0)
Platelets: 303 10*3/uL (ref 150–400)
RBC: 4.74 MIL/uL (ref 3.87–5.11)
RDW: 14.4 % (ref 11.5–15.5)
WBC: 15 10*3/uL — ABNORMAL HIGH (ref 4.0–10.5)
nRBC: 0 % (ref 0.0–0.2)

## 2021-01-10 LAB — COMPREHENSIVE METABOLIC PANEL
ALT: 33 U/L (ref 0–44)
AST: 22 U/L (ref 15–41)
Albumin: 2.6 g/dL — ABNORMAL LOW (ref 3.5–5.0)
Alkaline Phosphatase: 84 U/L (ref 38–126)
Anion gap: 6 (ref 5–15)
BUN: 33 mg/dL — ABNORMAL HIGH (ref 6–20)
CO2: 23 mmol/L (ref 22–32)
Calcium: 7.7 mg/dL — ABNORMAL LOW (ref 8.9–10.3)
Chloride: 112 mmol/L — ABNORMAL HIGH (ref 98–111)
Creatinine, Ser: 1.65 mg/dL — ABNORMAL HIGH (ref 0.44–1.00)
GFR, Estimated: 38 mL/min — ABNORMAL LOW (ref >60)
Glucose, Bld: 140 mg/dL — ABNORMAL HIGH (ref 70–99)
Potassium: 4.2 mmol/L (ref 3.5–5.1)
Sodium: 141 mmol/L (ref 135–145)
Total Bilirubin: 1.1 mg/dL (ref 0.3–1.2)
Total Protein: 5.3 g/dL — ABNORMAL LOW (ref 6.5–8.1)

## 2021-01-10 LAB — LIPASE, BLOOD: Lipase: 293 U/L — ABNORMAL HIGH (ref 11–51)

## 2021-01-10 MED ORDER — ALUM & MAG HYDROXIDE-SIMETH 200-200-20 MG/5ML PO SUSP
15.0000 mL | ORAL | Status: DC | PRN
  Administered 2021-01-10 – 2021-01-20 (×7): 15 mL via ORAL
  Filled 2021-01-10 (×7): qty 30

## 2021-01-10 MED ORDER — FUROSEMIDE 10 MG/ML IJ SOLN
20.0000 mg | Freq: Two times a day (BID) | INTRAMUSCULAR | Status: DC
  Administered 2021-01-10: 20 mg via INTRAVENOUS
  Filled 2021-01-10: qty 2

## 2021-01-10 MED ORDER — PANTOPRAZOLE SODIUM 40 MG IV SOLR
40.0000 mg | Freq: Two times a day (BID) | INTRAVENOUS | Status: DC
  Administered 2021-01-10 – 2021-01-17 (×14): 40 mg via INTRAVENOUS
  Filled 2021-01-10 (×14): qty 40

## 2021-01-10 MED ORDER — SODIUM CHLORIDE 0.9 % IV BOLUS
500.0000 mL | Freq: Once | INTRAVENOUS | Status: AC
  Administered 2021-01-10: 500 mL via INTRAVENOUS

## 2021-01-10 MED ORDER — DILTIAZEM HCL 25 MG/5ML IV SOLN
5.0000 mg | Freq: Once | INTRAVENOUS | Status: AC
  Administered 2021-01-10: 5 mg via INTRAVENOUS
  Filled 2021-01-10: qty 5

## 2021-01-10 MED ORDER — POLYVINYL ALCOHOL 1.4 % OP SOLN
1.0000 [drp] | OPHTHALMIC | Status: DC | PRN
  Administered 2021-01-10: 1 [drp] via OPHTHALMIC
  Filled 2021-01-10: qty 15

## 2021-01-10 MED ORDER — POLYETHYLENE GLYCOL 3350 17 G PO PACK
17.0000 g | PACK | Freq: Two times a day (BID) | ORAL | Status: DC
  Administered 2021-01-10 – 2021-01-14 (×8): 17 g via ORAL
  Filled 2021-01-10 (×8): qty 1

## 2021-01-10 MED ORDER — CHLORHEXIDINE GLUCONATE CLOTH 2 % EX PADS
6.0000 | MEDICATED_PAD | Freq: Every day | CUTANEOUS | Status: DC
  Administered 2021-01-10 – 2021-01-21 (×9): 6 via TOPICAL

## 2021-01-10 NOTE — Progress Notes (Signed)
[facility] PROGRESS NOTE   Kimberly Kelly VPX:[id number] DOB: September 02, 1970 DOA: 01/07/2021  PCP: [name], FNP  Brief History/Interval Summary: Female with a past medical history of pulmonary nodules, anxiety and depression who underwent ERCP at [facility] on the day she presented to the hospital.  This was done because of right upper quadrant abdominal pain.  History of cholecystectomy in 2018.  Following the ERCP patient developed abdominal pain.  She presented to the ED and was found to have elevated lipase level.  Diagnosed as acute pancreatitis and was hospitalized for further management.  Reason for Visit: Acute pancreatitis, post ERCP  Consultants: Gastroenterology  Procedures: None yet  Antibiotics: Anti-infectives (From admission, onward)    None       Subjective/Interval History: Patient was given hydromorphone just about an hour prior to my assessment.  She was a little drowsy.  Continues to have abdominal pain.  Continues to have acid reflux.  No nausea or vomiting.  Husband at the bedside.     Assessment/Plan:  Acute pancreatitis, post ERCP This was due to complication of ERCP.  Lipase level was noted to be significantly elevated.  CT findings noted.   Lipase level was noted to be improved.   Continue pain medications.  Continue IV fluids. Gastroenterology is following.  May need to consider feeding tube for nutrition purposes if patient unable to tolerate orally.  Hypoxia secondary to atelectasis and pleural effusion Noted to have oxygen requirements.  Patient however denies any dyspnea.  Chest x-ray was done overnight which showed small pleural effusions as well as atelectasis.  Incentive spirometry to continue.  Patient appears to be third spacing from acute pancreatitis.  Continue to monitor closely.  Low threshold for transfer to ICU if she continues to have higher oxygen requirements.  Acute kidney injury with concern for urinary  retention Foley catheter was placed overnight.  Creatinine noted to be 1.65 this morning which is a significant rise from yesterday.  She was on Toradol which has been discontinued.  She also received Lasix overnight which was also discontinued.  She appears to have good urine output.  Continue with IV fluids.  Recheck labs tomorrow.  Hypokalemia Repleted.  Thrombocytosis/leukocytosis Elevated platelet counts were noted at the time of admission.  Likely reactive.  Now normal. Elevated WBC likely due to acute inflammation.  Noted to be afebrile.  History of depression and anxiety Continue Lexapro.  Obesity Estimated body mass index is 30.73 kg/m as calculated from the following:   Height as of 12/27/20: 5\' 7"  (1.702 m).   Weight as of 12/27/20: 89 kg.   DVT Prophylaxis: Lovenox Code Status: Full code Family Communication: Discussed with patient and her husband who is at bedside Disposition Plan: Hopefully return home when improved.  Status is: Inpatient  Remains inpatient appropriate because:Ongoing active pain requiring inpatient pain management and IV treatments appropriate due to intensity of illness or inability to take PO  Dispo:  Patient From: Home  Planned Disposition: Home  Medically stable for discharge: No         Medications:  Scheduled:  Chlorhexidine Gluconate Cloth  6 each Topical Daily   enoxaparin (LOVENOX) injection  40 mg Subcutaneous Q24H   escitalopram  10 mg Oral Daily   pantoprazole (PROTONIX) IV  40 mg Intravenous Q12H   polyethylene glycol  17 g Oral BID   senna  1 tablet Oral Daily   Continuous:  sodium chloride 200 mL/hr at 01/10/21 1033   IOE:VOJJ &  mag hydroxide-simeth, HYDROmorphone (DILAUDID) injection, metoCLOPramide (REGLAN) injection, ondansetron (ZOFRAN) IV, polyvinyl alcohol   Objective:  Vital Signs  Vitals:   01/10/21 0248 01/10/21 0357 01/10/21 0859 01/10/21 1300  BP: (!) 135/98 123/81 114/70 128/83  Pulse: (!) 122 (!)  138 83 93  Resp: (!) 22 20 18 18   Temp: 97.7 F (36.5 C) 98.8 F (37.1 C) (!) 97.5 F (36.4 C) 98.1 F (36.7 C)  TempSrc: Oral  Oral Oral  SpO2: 95% 95% 97% 94%    Intake/Output Summary (Last 24 hours) at 01/10/2021 1313 Last data filed at 01/10/2021 1300 Gross per 24 hour  Intake 3848.3 ml  Output 400 ml  Net 3448.3 ml    There were no vitals filed for this visit.   General appearance: Slightly somnolent but easily arousable.  In no distress Resp: Normal effort at rest.  Slightly diminished air entry at the bases.  No crackles wheezing or rhonchi. Cardio: S1-S2 is tachycardic regular.  No S3-S4.  No rubs murmurs or bruit GI: Abdomen is soft.  Tender in the upper abdomen with some guarding.  No rebound rigidity.  Sluggish bowel sounds. Extremities: No edema.  Full range of motion of lower extremities. Neurologic: No focal neurological deficits.      Lab Results:  Data Reviewed: I have personally reviewed following labs and imaging studies  CBC: Recent Labs  Lab 01/07/21 2050 01/08/21 0500 01/09/21 0556 01/10/21 0543  WBC 17.5* 11.9* 16.7* 15.0*  NEUTROABS 15.5*  --   --   --   HGB 13.3 14.4 14.1 13.9  HCT 40.1 44.5 44.3 43.6  MCV 89.5 91.2 92.5 92.0  PLT 487* 368 327 303     Basic Metabolic Panel: Recent Labs  Lab 01/07/21 2050 01/08/21 0500 01/09/21 0556 01/10/21 0543  NA 142 142 143 141  K 3.3* 4.4 4.2 4.2  CL 108 111 112* 112*  CO2 27 24 24 23   GLUCOSE 148* 172* 141* 140*  BUN 11 9 15  33*  CREATININE 0.79 0.69 0.90 1.65*  CALCIUM 8.3* 8.1* 7.8* 7.7*  MG  --   --  1.7  --      GFR: CrCl cannot be calculated (Unknown ideal weight.).  Liver Function Tests: Recent Labs  Lab 01/07/21 2050 01/09/21 0556 01/10/21 0543  AST 23 30 22   ALT 40 39 33  ALKPHOS 79 86 84  BILITOT 0.4 1.1 1.1  PROT 6.2* 5.7* 5.3*  ALBUMIN 3.6 3.0* 2.6*     Recent Labs  Lab 01/07/21 2050 01/09/21 0556 01/10/21 0543  LIPASE 4,925* 673* 293*     Recent  Results (from the past 240 hour(s))  Resp Panel by RT-PCR (Flu A&B, Covid) Nasopharyngeal Swab     Status: None   Collection Time: 01/08/21  7:49 AM   Specimen: Nasopharyngeal Swab; Nasopharyngeal(NP) swabs in vial transport medium  Result Value Ref Range Status   SARS Coronavirus 2 by RT PCR NEGATIVE NEGATIVE Final    Comment: (NOTE) SARS-CoV-2 target nucleic acids are NOT DETECTED.  The SARS-CoV-2 RNA is generally detectable in upper respiratory specimens during the acute phase of infection. The lowest concentration of SARS-CoV-2 viral copies this assay can detect is 138 copies/mL. A negative result does not preclude SARS-Cov-2 infection and should not be used as the sole basis for treatment or other patient management decisions. A negative result may occur with  improper specimen collection/handling, submission of specimen other than nasopharyngeal swab, presence of viral mutation(s) within the areas targeted by this assay,  and inadequate number of viral copies(<138 copies/mL). A negative result must be combined with clinical observations, patient history, and epidemiological information. The expected result is Negative.  Fact Sheet for Patients:  EntrepreneurPulse.com.au  Fact Sheet for Healthcare Providers:  IncredibleEmployment.be  This test is no t yet approved or cleared by the [place] FDA and  has been authorized for detection and/or diagnosis of SARS-CoV-2 by FDA under an Emergency Use Authorization (EUA). This EUA will remain  in effect (meaning this test can be used) for the duration of the COVID-19 declaration under Section 564(b)(1) of the Act, 21 U.S.C.section 360bbb-3(b)(1), unless the authorization is terminated  or revoked sooner.       Influenza A by PCR NEGATIVE NEGATIVE Final   Influenza B by PCR NEGATIVE NEGATIVE Final    Comment: (NOTE) The Xpert Xpress SARS-CoV-2/FLU/RSV plus assay is intended as an aid in the  diagnosis of influenza from Nasopharyngeal swab specimens and should not be used as a sole basis for treatment. Nasal washings and aspirates are unacceptable for Xpert Xpress SARS-CoV-2/FLU/RSV testing.  Fact Sheet for Patients: EntrepreneurPulse.com.au  Fact Sheet for Healthcare Providers: IncredibleEmployment.be  This test is not yet approved or cleared by the [place] FDA and has been authorized for detection and/or diagnosis of SARS-CoV-2 by FDA under an Emergency Use Authorization (EUA). This EUA will remain in effect (meaning this test can be used) for the duration of the COVID-19 declaration under Section 564(b)(1) of the Act, 21 U.S.C. section 360bbb-3(b)(1), unless the authorization is terminated or revoked.  Performed at [facility], [place] [address], [place], [place] [address]       Radiology Studies: DG CHEST PORT 1 VIEW  Result Date: 01/09/2021 CLINICAL DATA:  Hypoxia, dyspnea EXAM: PORTABLE CHEST 1 VIEW COMPARISON:  10/10/2016 FINDINGS: Small to moderate bilateral pleural effusions have developed with associated bibasilar atelectasis. No superimposed focal pulmonary infiltrate. No pneumothorax. Cardiac size is within normal limits. Right suprahilar opacity likely relates to vascular shadow accentuated by semi-erect positioning. Pulmonary vascularity is normal. No acute bone abnormality. IMPRESSION: Interval development of small to moderate bilateral pleural effusions and associated bibasilar compressive atelectasis. Electronically Signed   By: Fidela Salisbury MD   On: 01/09/2021 21:24        LOS: 2 days   [facility] Pager on [email or web address]  01/10/2021, 1:13 PM

## 2021-01-10 NOTE — Progress Notes (Signed)
Received verbal from Dr. Argie Ramming to place foley catheter  for this patient. Order carried out.

## 2021-01-10 NOTE — Progress Notes (Signed)
On call notified of Yellow MEWS

## 2021-01-10 NOTE — Progress Notes (Signed)
[facility] Gastroenterology Progress Note  Kimberly Kelly 50 y.o. 09/06/1970  CC: Acute pancreatitis   Subjective: Patient seen and examined at bedside.  Family at bedside.  Denies to have epigastric abdominal pain.  No bowel movement since admission.  Complaining of nausea but denies any vomiting.  C/O  more acid reflux  ROS : Afebrile.  Negative for chest pain.   Objective: Vital signs in last 24 hours: Vitals:   01/10/21 0357 01/10/21 0859  BP: 123/81 114/70  Pulse: (!) 138 83  Resp: 20 18  Temp: 98.8 F (37.1 C) (!) 97.5 F (36.4 C)  SpO2: 95% 97%    Physical Exam:  General:  Alert, cooperative, no distress, appears stated age, oxygen by nasal cannula  Head:  Normocephalic, without obvious abnormality, atraumatic  Eyes:  , EOM's intact,   Lungs:   Fine basilar rales.  No visible respiratory distress  Heart:  Regular rate and rhythm, S1, S2 normal  Abdomen:   Epigastric tenderness to palpation without any peritoneal signs.  Bowel sounds are hypoactive but present.  Abdominal soft.  Extremities: Extremities normal, atraumatic, no  edema       Lab Results: Recent Labs    01/09/21 0556 01/10/21 0543  NA 143 141  K 4.2 4.2  CL 112* 112*  CO2 24 23  GLUCOSE 141* 140*  BUN 15 33*  CREATININE 0.90 1.65*  CALCIUM 7.8* 7.7*  MG 1.7  --    Recent Labs    01/09/21 0556 01/10/21 0543  AST 30 22  ALT 39 33  ALKPHOS 86 84  BILITOT 1.1 1.1  PROT 5.7* 5.3*  ALBUMIN 3.0* 2.6*   Recent Labs    01/07/21 2050 01/08/21 0500 01/09/21 0556 01/10/21 0543  WBC 17.5*   < > 16.7* 15.0*  NEUTROABS 15.5*  --   --   --   HGB 13.3   < > 14.1 13.9  HCT 40.1   < > 44.3 43.6  MCV 89.5   < > 92.5 92.0  PLT 487*   < > 327 303   < > = values in this interval not displayed.   No results for input(s): LABPROT, INR in the last 72 hours.    Assessment/Plan: -Acute post ERCP pancreatitis. -Constipation -GERD -Acute kidney  injury  Recommendations ------------------------- -Increase IV fluids to 200 cc/h -Change Protonix to IV twice daily -Change MiraLAX to scheduled twice a day -Option for jejunal feeding versus increasing diet discussed with patient and patient's family.  They would like to try full liquid diet first before starting jejunal feeding -Discussed with RN.  Trial of full liquid diet.  If tolerating, advance to soft.  If not able to tolerate full liquid diet, we will need postpyloric feeding tube placement. -Repeat labs in the morning  Otis Brace MD, [place] 01/10/2021, 10:18 AM  Contact #  [phone]

## 2021-01-11 DIAGNOSIS — D72829 Elevated white blood cell count, unspecified: Secondary | ICD-10-CM | POA: Diagnosis not present

## 2021-01-11 DIAGNOSIS — K858 Other acute pancreatitis without necrosis or infection: Secondary | ICD-10-CM | POA: Diagnosis not present

## 2021-01-11 LAB — COMPREHENSIVE METABOLIC PANEL
ALT: 24 U/L (ref 0–44)
AST: 15 U/L (ref 15–41)
Albumin: 2.5 g/dL — ABNORMAL LOW (ref 3.5–5.0)
Alkaline Phosphatase: 81 U/L (ref 38–126)
Anion gap: 5 (ref 5–15)
BUN: 35 mg/dL — ABNORMAL HIGH (ref 6–20)
CO2: 24 mmol/L (ref 22–32)
Calcium: 8 mg/dL — ABNORMAL LOW (ref 8.9–10.3)
Chloride: 116 mmol/L — ABNORMAL HIGH (ref 98–111)
Creatinine, Ser: 1.28 mg/dL — ABNORMAL HIGH (ref 0.44–1.00)
GFR, Estimated: 51 mL/min — ABNORMAL LOW (ref >60)
Glucose, Bld: 133 mg/dL — ABNORMAL HIGH (ref 70–99)
Potassium: 3.7 mmol/L (ref 3.5–5.1)
Sodium: 145 mmol/L (ref 135–145)
Total Bilirubin: 0.8 mg/dL (ref 0.3–1.2)
Total Protein: 5.3 g/dL — ABNORMAL LOW (ref 6.5–8.1)

## 2021-01-11 LAB — CBC
HCT: 37.4 % (ref 36.0–46.0)
Hemoglobin: 11.8 g/dL — ABNORMAL LOW (ref 12.0–15.0)
MCH: 29.5 pg (ref 26.0–34.0)
MCHC: 31.6 g/dL (ref 30.0–36.0)
MCV: 93.5 fL (ref 80.0–100.0)
Platelets: 317 10*3/uL (ref 150–400)
RBC: 4 MIL/uL (ref 3.87–5.11)
RDW: 14.5 % (ref 11.5–15.5)
WBC: 9.5 10*3/uL (ref 4.0–10.5)
nRBC: 0 % (ref 0.0–0.2)

## 2021-01-11 LAB — LIPASE, BLOOD: Lipase: 72 U/L — ABNORMAL HIGH (ref 11–51)

## 2021-01-11 MED ORDER — DIPHENHYDRAMINE HCL 12.5 MG/5ML PO ELIX: 12.5000 mg | ORAL_SOLUTION | Freq: Four times a day (QID) | ORAL | Status: DC | PRN

## 2021-01-11 MED ORDER — OXYCODONE HCL 5 MG PO TABS
5.0000 mg | ORAL_TABLET | ORAL | Status: DC | PRN
  Administered 2021-01-11 – 2021-01-13 (×3): 10 mg via ORAL
  Filled 2021-01-11 (×3): qty 2

## 2021-01-11 MED ORDER — HYDROMORPHONE HCL 1 MG/ML IJ SOLN
1.0000 mg | Freq: Once | INTRAMUSCULAR | Status: AC
  Administered 2021-01-11: 1 mg via INTRAMUSCULAR
  Filled 2021-01-11: qty 1

## 2021-01-11 MED ORDER — DIPHENHYDRAMINE HCL 50 MG/ML IJ SOLN: 12.5000 mg | Freq: Four times a day (QID) | INTRAMUSCULAR | Status: DC | PRN

## 2021-01-11 MED ORDER — NALOXONE HCL 0.4 MG/ML IJ SOLN: 0.4000 mg | INTRAMUSCULAR | Status: DC | PRN

## 2021-01-11 MED ORDER — SODIUM CHLORIDE 0.9% FLUSH: 9.0000 mL | INTRAVENOUS | Status: DC | PRN

## 2021-01-11 MED ORDER — KETOROLAC TROMETHAMINE 30 MG/ML IJ SOLN: 30.0000 mg | Freq: Once | INTRAMUSCULAR | Status: DC

## 2021-01-11 MED ORDER — HYDROMORPHONE 1 MG/ML IV SOLN
INTRAVENOUS | Status: DC
  Administered 2021-01-11: 30 mg via INTRAVENOUS
  Administered 2021-01-11: 2.6 mg via INTRAVENOUS
  Administered 2021-01-12: 1.2 mg via INTRAVENOUS
  Administered 2021-01-12: 0.9 mg via INTRAVENOUS
  Administered 2021-01-12: 1.2 mg via INTRAVENOUS
  Administered 2021-01-12: 4.5 mg via INTRAVENOUS
  Administered 2021-01-12: 1.5 mg via INTRAVENOUS
  Administered 2021-01-12: 1.8 mg via INTRAVENOUS
  Administered 2021-01-13: 0.9 mg via INTRAVENOUS
  Administered 2021-01-13 (×2): 1.2 mg via INTRAVENOUS
  Filled 2021-01-11: qty 30

## 2021-01-11 NOTE — Progress Notes (Signed)
The patient is miserable with ongoing abdominal pain, which she thinks is more severe than at time of admission.  She is frustrated by the delay in receiving her oral pain medication for breakthrough pain.    However, the patient is showing a trend toward improvement in several areas:  Lipase level has dropped progressively since admission, and is now almost down to normal.  White count, which was previously elevated, has also normalized.  Creatinine, which went up significantly yesterday, is improved (dropped from 1.65 yesterday to 1.28 this morning).  Hemoglobin level has been dropping, which is appropriate and indicative of adequate hydration.  Meanwhile, on exam, the patient seems very distraught because of her abdominal pain, although when distracted, she does not seem to be in acute distress.  The abdomen is quite firm and tender.  The chest is clear, good air movement, no respiratory distress.  Impression: Resolving pancreatitis but with ongoing severe abdominal pain  Recommendation: I have discussed the patient's case with her attending hospitalist and have recommended that we try switching to PCA pain medication, which I think would allow her to keep much more current on her analgesia.  The patient, her husband, and the hospitalist all seem in agreement with trying this.  Cleotis Nipper, M.D. Pager [phone] If no answer or after 5 PM call [phone]

## 2021-01-11 NOTE — Progress Notes (Addendum)
Spoke to patient due to patient telling RN that she is not receiving adequate care. Asked patient how is she not receiving adequate care? She states that the nurse is not on board with her plan of care. Again, I asked the patient how? Patient then states she feels like she is being criticized for the amount of pain medicine she is using. I asked her who is criticizing her? I advised that if the doctor didn't feel like she needed pain medicine, he wouldn't have ordered them. Patient was unable to provide a response to any of the questions in order for me to better understand her concerns. She then stated that she can't have a conversation while she is on pain meds. I told her that I understand that and that a side effect of the pain medicine is feeling a little loopy and out of it. She states she never asked to be knocked out just for her pain to be controlled. Advised patient that she was switched to the PCA because she felt like her pain wasn't being controlled with the PRN medicine. Advised that with the PCA she has the pain medicine at her disposal when she feels like she needs it. Educated patient that we are not allowed to push the PCA button or to come in and ask her if she is in pain to press the PCA. Patient was falling asleep on and off during conversation. Patient then stated that I was arguing with her. Advised patient that I was really just trying to understand her concerns. She said she feels like she is not getting anywhere. I apologized and told her it is hard to have this conversation when she is not fully coherent. I advised that if she feels better later I would come back in and address her concerns. Patient was back asleep for before I left the room.

## 2021-01-11 NOTE — Progress Notes (Signed)
In to clear pump and offer medications scheduled. Patient continues to fall asleep when addressed.  Asked if patient would like to perform care patient falls asleep.  Patient rates pain at 9 /10 due to this rating will communi cate with staff to continue to attempt care.  Also suggested to use IS patient fell asleep will readdress at later time.

## 2021-01-11 NOTE — Progress Notes (Signed)
[facility] PROGRESS NOTE   Kimberly Kelly FEO:[id number] DOB: 1971-07-03 DOA: 01/07/2021  PCP: [name], FNP  Brief History/Interval Summary: Female with a past medical history of pulmonary nodules, anxiety and depression who underwent ERCP at [facility] on the day she presented to the hospital.  This was done because of right upper quadrant abdominal pain.  History of cholecystectomy in 2018.  Following the ERCP patient developed abdominal pain.  She presented to the ED and was found to have elevated lipase level.  Diagnosed as acute pancreatitis and was hospitalized for further management.  Reason for Visit: Acute pancreatitis, post ERCP  Consultants: Gastroenterology  Procedures: None yet  Antibiotics: Anti-infectives (From admission, onward)    None       Subjective/Interval History: Patient continues to have significant pain in the abdomen.  Denies any nausea vomiting.  Continues to have acid reflux.  Has been making urine.  No family at bedside today.   Assessment/Plan:  Acute pancreatitis, post ERCP This was due to complication of ERCP.  Lipase level was noted to be significantly elevated.  CT findings noted.  Improvement in lipase noted. Continues to have significant symptoms.  Continue pain medications IV fluids. Gastroenterology is following.  Seems to be tolerating liquid diet. May need to consider feeding tube for nutrition purposes. Sinus tachycardia noted which appears to be slightly better today compared to yesterday.  Multifactorial including acute inflammation and pain issues.  Hypoxia secondary to atelectasis and pleural effusion Noted to have oxygen requirements.  Patient however denies any dyspnea.  Chest x-ray was done which showed small pleural effusions as well as atelectasis.   Patient encouraged to keep up with incentive spirometer.  There is also some evidence for third spacing from acute pancreatitis due to presence of pleural  effusion. Respiratory status seems to be stable.  Continues to require 3 to 4 L of oxygen by nasal cannula.   Continue to monitor closely.  Low threshold for transfer to ICU if she continues to have higher oxygen requirements.  Acute kidney injury with concern for urinary retention Foley catheter had to be placed.  Creatinine was noted to be higher yesterday at 1.65.  Noted to be better today.  Monitor urine output.  Toradol and furosemide were discontinued.  Avoid nephrotoxic agents.    Normocytic anemia Drop in hemoglobin is likely dilutional.  No evidence of overt bleeding.  Hypokalemia Repleted.  Thrombocytosis/leukocytosis Elevated platelet counts were noted at the time of admission.  Likely reactive.  Now normal. Elevated WBC likely due to acute inflammation.  Noted to be afebrile.  History of depression and anxiety Continue Lexapro.  Obesity Estimated body mass index is 30.73 kg/m as calculated from the following:   Height as of 12/27/20: 5\' 7"  (1.702 m).   Weight as of 12/27/20: 89 kg.   DVT Prophylaxis: Lovenox Code Status: Full code Family Communication: Discussed with patient.  No family at bedside. Disposition Plan: Hopefully return home when improved.  Status is: Inpatient  Remains inpatient appropriate because:Ongoing active pain requiring inpatient pain management and IV treatments appropriate due to intensity of illness or inability to take PO  Dispo:  Patient From: Home  Planned Disposition: Home  Medically stable for discharge: No         Medications:  Scheduled:  Chlorhexidine Gluconate Cloth  6 each Topical Daily   enoxaparin (LOVENOX) injection  40 mg Subcutaneous Q24H   escitalopram  10 mg Oral Daily    HYDROmorphone (DILAUDID) injection  1 mg Intramuscular Once   pantoprazole (PROTONIX) IV  40 mg Intravenous Q12H   polyethylene glycol  17 g Oral BID   senna  1 tablet Oral Daily   Continuous:  sodium chloride 200 mL/hr at 01/11/21 0539    SWF:UXNA & mag hydroxide-simeth, HYDROmorphone (DILAUDID) injection, metoCLOPramide (REGLAN) injection, ondansetron (ZOFRAN) IV, polyvinyl alcohol   Objective:  Vital Signs  Vitals:   01/10/21 2134 01/10/21 2137 01/11/21 0129 01/11/21 0531  BP: 110/81 110/81 136/83 (!) 138/92  Pulse: (!) 130 (!) 131 (!) 118 (!) 115  Resp: 17  18 20   Temp: 98 F (36.7 C)  97.8 F (36.6 C) 97.8 F (36.6 C)  TempSrc:   Oral Oral  SpO2:  99% 97% 98%    Intake/Output Summary (Last 24 hours) at 01/11/2021 1028 Last data filed at 01/11/2021 0534 Gross per 24 hour  Intake 2964.5 ml  Output 1775 ml  Net 1189.5 ml    There were no vitals filed for this visit.   General appearance: Somnolent but easily arousable. Resp:  poor effort noted.  Diminished air entry at the bases.  No definite crackles or wheezing. Cardio: S1-S2 is tachycardic regular.  No S3-S4.  No rubs murmurs or bruit GI: Remains tender in the upper abdomen without any rebound or rigidity.  Mild guarding is present.  Bowel sounds sluggish.   Extremities: No edema.  Moving all of her extremities Neurologic: No focal neurological deficits.       Lab Results:  Data Reviewed: I have personally reviewed following labs and imaging studies  CBC: Recent Labs  Lab 01/07/21 2050 01/08/21 0500 01/09/21 0556 01/10/21 0543 01/11/21 0612  WBC 17.5* 11.9* 16.7* 15.0* 9.5  NEUTROABS 15.5*  --   --   --   --   HGB 13.3 14.4 14.1 13.9 11.8*  HCT 40.1 44.5 44.3 43.6 37.4  MCV 89.5 91.2 92.5 92.0 93.5  PLT 487* 368 327 303 317     Basic Metabolic Panel: Recent Labs  Lab 01/07/21 2050 01/08/21 0500 01/09/21 0556 01/10/21 0543 01/11/21 0612  NA 142 142 143 141 145  K 3.3* 4.4 4.2 4.2 3.7  CL 108 111 112* 112* 116*  CO2 27 24 24 23 24   GLUCOSE 148* 172* 141* 140* 133*  BUN 11 9 15  33* 35*  CREATININE 0.79 0.69 0.90 1.65* 1.28*  CALCIUM 8.3* 8.1* 7.8* 7.7* 8.0*  MG  --   --  1.7  --   --      GFR: CrCl cannot be  calculated (Unknown ideal weight.).  Liver Function Tests: Recent Labs  Lab 01/07/21 2050 01/09/21 0556 01/10/21 0543 01/11/21 0612  AST 23 30 22 15   ALT 40 39 33 24  ALKPHOS 79 86 84 81  BILITOT 0.4 1.1 1.1 0.8  PROT 6.2* 5.7* 5.3* 5.3*  ALBUMIN 3.6 3.0* 2.6* 2.5*     Recent Labs  Lab 01/07/21 2050 01/09/21 0556 01/10/21 0543 01/11/21 0612  LIPASE 4,925* 673* 293* 72*     Recent Results (from the past 240 hour(s))  Resp Panel by RT-PCR (Flu A&B, Covid) Nasopharyngeal Swab     Status: None   Collection Time: 01/08/21  7:49 AM   Specimen: Nasopharyngeal Swab; Nasopharyngeal(NP) swabs in vial transport medium  Result Value Ref Range Status   SARS Coronavirus 2 by RT PCR NEGATIVE NEGATIVE Final    Comment: (NOTE) SARS-CoV-2 target nucleic acids are NOT DETECTED.  The SARS-CoV-2 RNA is generally detectable in upper respiratory specimens  during the acute phase of infection. The lowest concentration of SARS-CoV-2 viral copies this assay can detect is 138 copies/mL. A negative result does not preclude SARS-Cov-2 infection and should not be used as the sole basis for treatment or other patient management decisions. A negative result may occur with  improper specimen collection/handling, submission of specimen other than nasopharyngeal swab, presence of viral mutation(s) within the areas targeted by this assay, and inadequate number of viral copies(<138 copies/mL). A negative result must be combined with clinical observations, patient history, and epidemiological information. The expected result is Negative.  Fact Sheet for Patients:  EntrepreneurPulse.com.au  Fact Sheet for Healthcare Providers:  IncredibleEmployment.be  This test is no t yet approved or cleared by the [place] FDA and  has been authorized for detection and/or diagnosis of SARS-CoV-2 by FDA under an Emergency Use Authorization (EUA). This EUA will remain  in  effect (meaning this test can be used) for the duration of the COVID-19 declaration under Section 564(b)(1) of the Act, 21 U.S.C.section 360bbb-3(b)(1), unless the authorization is terminated  or revoked sooner.       Influenza A by PCR NEGATIVE NEGATIVE Final   Influenza B by PCR NEGATIVE NEGATIVE Final    Comment: (NOTE) The Xpert Xpress SARS-CoV-2/FLU/RSV plus assay is intended as an aid in the diagnosis of influenza from Nasopharyngeal swab specimens and should not be used as a sole basis for treatment. Nasal washings and aspirates are unacceptable for Xpert Xpress SARS-CoV-2/FLU/RSV testing.  Fact Sheet for Patients: EntrepreneurPulse.com.au  Fact Sheet for Healthcare Providers: IncredibleEmployment.be  This test is not yet approved or cleared by the [place] FDA and has been authorized for detection and/or diagnosis of SARS-CoV-2 by FDA under an Emergency Use Authorization (EUA). This EUA will remain in effect (meaning this test can be used) for the duration of the COVID-19 declaration under Section 564(b)(1) of the Act, 21 U.S.C. section 360bbb-3(b)(1), unless the authorization is terminated or revoked.  Performed at [facility], [place] [address] [place], [place] [address]       Radiology Studies: DG CHEST PORT 1 VIEW  Result Date: 01/09/2021 CLINICAL DATA:  Hypoxia, dyspnea EXAM: PORTABLE CHEST 1 VIEW COMPARISON:  10/10/2016 FINDINGS: Small to moderate bilateral pleural effusions have developed with associated bibasilar atelectasis. No superimposed focal pulmonary infiltrate. No pneumothorax. Cardiac size is within normal limits. Right suprahilar opacity likely relates to vascular shadow accentuated by semi-erect positioning. Pulmonary vascularity is normal. No acute bone abnormality. IMPRESSION: Interval development of small to moderate bilateral pleural effusions and associated bibasilar compressive  atelectasis. Electronically Signed   By: Fidela Salisbury MD   On: 01/09/2021 21:24        LOS: 3 days   [facility] Pager on [email or web address]  01/11/2021, 10:28 AM

## 2021-01-11 NOTE — Progress Notes (Signed)
Text page made to on call FYI that patient continues to be yellow MEWS has PCA for pain.  Rating pain at 9.  HR 118-122. BP 153/100.  Staff and this nurse continue to monitor for needs.

## 2021-01-11 NOTE — Progress Notes (Signed)
Checked on patient to ensure adequate care and address any needs. Patient is asleep and is not responding to any questions regarding care. All necessary monitoring systems in place for PCA. Patient in bed, bed alarm on, and bed in lowest position.

## 2021-01-11 NOTE — Progress Notes (Signed)
Page sent concerning continuation of yellow MEWS

## 2021-01-12 DIAGNOSIS — K858 Other acute pancreatitis without necrosis or infection: Secondary | ICD-10-CM | POA: Diagnosis not present

## 2021-01-12 DIAGNOSIS — D72829 Elevated white blood cell count, unspecified: Secondary | ICD-10-CM | POA: Diagnosis not present

## 2021-01-12 LAB — CBC
HCT: 35.5 % — ABNORMAL LOW (ref 36.0–46.0)
Hemoglobin: 11 g/dL — ABNORMAL LOW (ref 12.0–15.0)
MCH: 28.7 pg (ref 26.0–34.0)
MCHC: 31 g/dL (ref 30.0–36.0)
MCV: 92.7 fL (ref 80.0–100.0)
Platelets: 319 10*3/uL (ref 150–400)
RBC: 3.83 MIL/uL — ABNORMAL LOW (ref 3.87–5.11)
RDW: 14.6 % (ref 11.5–15.5)
WBC: 10.9 10*3/uL — ABNORMAL HIGH (ref 4.0–10.5)
nRBC: 0 % (ref 0.0–0.2)

## 2021-01-12 LAB — COMPREHENSIVE METABOLIC PANEL
ALT: 25 U/L (ref 0–44)
AST: 16 U/L (ref 15–41)
Albumin: 2.2 g/dL — ABNORMAL LOW (ref 3.5–5.0)
Alkaline Phosphatase: 113 U/L (ref 38–126)
Anion gap: 4 — ABNORMAL LOW (ref 5–15)
BUN: 32 mg/dL — ABNORMAL HIGH (ref 6–20)
CO2: 23 mmol/L (ref 22–32)
Calcium: 8.1 mg/dL — ABNORMAL LOW (ref 8.9–10.3)
Chloride: 118 mmol/L — ABNORMAL HIGH (ref 98–111)
Creatinine, Ser: 1.06 mg/dL — ABNORMAL HIGH (ref 0.44–1.00)
GFR, Estimated: >60 mL/min (ref >60)
Glucose, Bld: 135 mg/dL — ABNORMAL HIGH (ref 70–99)
Potassium: 3.8 mmol/L (ref 3.5–5.1)
Sodium: 145 mmol/L (ref 135–145)
Total Bilirubin: 0.4 mg/dL (ref 0.3–1.2)
Total Protein: 5.2 g/dL — ABNORMAL LOW (ref 6.5–8.1)

## 2021-01-12 NOTE — Progress Notes (Signed)
Pain is under better control with PCA management.  Patient sleeping intermittently, also sometimes a little confused, according to the patient's mother who is a Marine scientist, and who has come in from [place] and is at the bedside.    According to the patient's mother, she is having very limited quantities of her full liquid diet.  Renal function continues to improve, creatinine has dropped from 1.28 yesterday to 1.06 this morning.  Hemoglobin continues to drift downward, consistent with adequacy of hydration (11.8 yesterday versus 11.0 this morning).  Lipase not rechecked today, will order for tomorrow.  White count is marginally increased, 9.5 yesterday and 10.9 this morning.  Patient remains afebrile.  Impression: Slowly resolving severe post ERCP pancreatitis  Recommendations:  1.  Consider placement of enteric feeding tube tomorrow for nutrition support  2.  Consider CT in a couple of days to evaluate evolution of patient's pancreatitis and check for necrosis.  Cleotis Nipper, M.D. Pager [phone] If no answer or after 5 PM call [phone]

## 2021-01-12 NOTE — Evaluation (Signed)
Occupational Therapy Evaluation Patient Details Name: Kimberly Kelly MRN: [id number] DOB: 09-03-1970 Today's Date: 01/12/2021    History of Present Illness This 50 y.o. female admitted s/p ERCP with acute pancratitis.   PMH includes: anxiety, lung nodules, diverticulosis, s/p cholectystectomy, frozen shoulder Lt.   Clinical Impression   Pt admitted with above. She demonstrates the below listed deficits and will benefit from continued OT to maximize safety and independence with BADLs.  Pt presents to OT with generalized weakness, decreased activity tolerance, impaired balance, increased pain.  She currently requires min A - mod A for UB ADLs and max - total A for LB ADLs.  She was able to ambulate ~15 ft with min guard assist using RW with HR 132 and Sp02 86% on RA.  Pt instructed in IS use and was able to pull 71ml.  Pt lives with spouse and teenage children and reports she was fully independent PTA.  Will follow acutely.  Anticipate good progress once pain under control.     Follow Up Recommendations  No OT follow up;Supervision - Intermittent    Equipment Recommendations  None recommended by OT    Recommendations for Other Services       Precautions / Restrictions Precautions Precautions: Fall Precaution Comments: monitor Sp02      Mobility Bed Mobility Overal bed mobility: Needs Assistance Bed Mobility: Supine to Sit     Supine to sit: Min guard     General bed mobility comments: HOB elevated.  requires increased time and effort to move to EOB    Transfers Overall transfer level: Needs assistance Equipment used: Rolling walker (2 wheeled) Transfers: Sit to/from [facility] Sit to Stand: Min guard Stand pivot transfers: Min guard       General transfer comment: verbal cues for hand placement and min guard assist for safety    Balance Overall balance assessment: Needs assistance Sitting-balance support: Feet supported Sitting balance-Leahy Scale:  Fair     Standing balance support: Bilateral upper extremity supported;Single extremity supported Standing balance-Leahy Scale: Poor Standing balance comment: pt requires bil. UE support                           ADL either performed or assessed with clinical judgement   ADL Overall ADL's : Needs assistance/impaired Eating/Feeding: Set up;Sitting   Grooming: Brushing hair;Minimal assistance;Sitting   Upper Body Bathing: Moderate assistance;Sitting   Lower Body Bathing: Moderate assistance;Sit to/from stand   Upper Body Dressing : Moderate assistance;Sitting   Lower Body Dressing: Maximal assistance;Sit to/from stand   Toilet Transfer: Min guard;Stand-pivot;BSC;RW   Toileting- Clothing Manipulation and Hygiene: Maximal assistance;Sit to/from stand       Functional mobility during ADLs: Min guard;Rolling walker General ADL Comments: limited by pain and lethargy     Vision Patient Visual Report: No change from baseline       Perception     Praxis      Pertinent Vitals/Pain Pain Assessment: Faces Faces Pain Scale: Hurts even more Pain Location: abdomen Pain Descriptors / Indicators: Moaning;Guarding Pain Intervention(s): PCA encouraged     Hand Dominance Right   Extremity/Trunk Assessment Upper Extremity Assessment Upper Extremity Assessment: LUE deficits/detail LUE Deficits / Details: h/o Lt frozen shoulder with shoulder flexion ~105*   Lower Extremity Assessment Lower Extremity Assessment: Defer to PT evaluation   Cervical / Trunk Assessment Cervical / Trunk Assessment: Normal   Communication Communication Communication: No difficulties   Cognition Arousal/Alertness: Lethargic Behavior  During Therapy: Flat affect Overall Cognitive Status: Impaired/Different from baseline                                 General Comments: Pt was lethargic and slow to respond - likely due to pain medications   General Comments  RHR 112, and  sp02 94% on RA.  With activity HR 132 and Sp02 86% on RA with DOE 3/4.  Pt instructed in IS use and was able to pull 732ml    Exercises     Shoulder Instructions      Home Living Family/patient expects to be discharged to:: Private residence Living Arrangements: Spouse/significant other;Children Available Help at Discharge: Family;Available 24 hours/day (initially) Type of Home: House Home Access: Stairs to enter [employer] of Steps: 3-4   Home Layout: Two level     Bathroom Shower/Tub: Occupational psychologist: Standard     Home Equipment: None   Additional Comments: Pt lives with visuall impaired spouse, as well as 33 and 51 y.o children.  Her son is also visual impaired      Prior Functioning/Environment Level of Independence: Independent        Comments: Pt reports she was fully independent PTA.  Does have a h/o of Lt frozen shoulder        OT Problem List: Decreased activity tolerance;Impaired balance (sitting and/or standing);Decreased knowledge of use of DME or AE;Cardiopulmonary status limiting activity;Pain      OT Treatment/Interventions: Self-care/ADL training;Therapeutic exercise;DME and/or AE instruction;Energy conservation;Therapeutic activities;Patient/family education;Balance training    OT Goals(Current goals can be found in the care plan section) Acute Rehab OT Goals Patient Stated Goal: to have less pain and get back to normal OT Goal Formulation: With patient/family Time For Goal Achievement: 01/26/21 Potential to Achieve Goals: Good ADL Goals Pt Will Perform Grooming: with modified independence;standing Pt Will Perform Upper Body Bathing: with modified independence;sitting Pt Will Perform Lower Body Bathing: with modified independence;sit to/from stand Pt Will Perform Upper Body Dressing: with modified independence;sitting;standing Pt Will Perform Lower Body Dressing: with modified independence;sit to/from stand Pt Will  Transfer to Toilet: with modified independence;ambulating;regular height toilet;grab bars Pt Will Perform Toileting - Clothing Manipulation and hygiene: with modified independence;sit to/from stand Pt Will Perform Tub/Shower Transfer: Shower transfer;with modified independence;shower seat;ambulating;rolling walker  OT Frequency: Min 2X/week   Barriers to D/C:            Co-evaluation              AM-PAC OT "6 Clicks" Daily Activity     Outcome Measure Help from another person eating meals?: A Little Help from another person taking care of personal grooming?: A Little Help from another person toileting, which includes using toliet, bedpan, or urinal?: A Lot Help from another person bathing (including washing, rinsing, drying)?: A Lot Help from another person to put on and taking off regular upper body clothing?: A Lot Help from another person to put on and taking off regular lower body clothing?: Total 6 Click Score: 13   End of Session Equipment Utilized During Treatment: Rolling walker;Oxygen Nurse Communication: Mobility status  Activity Tolerance: Patient limited by pain Patient left: in chair;with call bell/phone within reach;with family/visitor present  OT Visit Diagnosis: Unsteadiness on feet (R26.81);Pain Pain - part of body:  (abdomen)                Time: 9528-4132 OT Time Calculation (  min): 56 min Charges:  OT General Charges $OT Visit: 1 Visit OT Evaluation $OT Eval Moderate Complexity: 1 Mod OT Treatments $Self Care/Home Management : 23-37 mins $Therapeutic Activity: 8-22 mins  Kimberly Kelly., OTR/L Acute Rehabilitation Services Pager [phone] Office [phone]   Kimberly Kelly 01/12/2021, 2:55 PM

## 2021-01-12 NOTE — Progress Notes (Signed)
[facility] PROGRESS NOTE   DORIA FERN RSW:[id number] DOB: 1971/08/02 DOA: 01/07/2021  PCP: [name], FNP  Brief History/Interval Summary: Female with a past medical history of pulmonary nodules, anxiety and depression who underwent ERCP at [facility] on the day she presented to the hospital.  This was done because of right upper quadrant abdominal pain.  History of cholecystectomy in 2018.  Following the ERCP patient developed abdominal pain.  She presented to the ED and was found to have elevated lipase level.  Diagnosed as acute pancreatitis and was hospitalized for further management.  Reason for Visit: Acute pancreatitis, post ERCP  Consultants: Gastroenterology  Procedures: None yet  Antibiotics: Anti-infectives (From admission, onward)    None       Subjective/Interval History: Overnight events noted.  Patient noted to be somewhat somnolent but easily arousable.  Continues to complain of poorly controlled pain.  Mother is at the bedside.  No nausea vomiting.     Assessment/Plan:  Acute pancreatitis, post ERCP This was due to complication of ERCP.  Lipase level was noted to be significantly elevated.  CT findings noted.  Improvement in lipase noted. Continues to follow.  Remains on full liquid diet.  Defer tube feedings to gastroenterology. Sinus tachycardia seems to be improving. Patient and family told that this will be a long protracted course.  Patient was transitioned to PCA pump after discussions with gastroenterology yesterday. Continue IV fluids but cut down the rate since he seems to be developing edema.  Hypoxia secondary to atelectasis and pleural effusion Noted to have oxygen requirements.  Patient however denies any dyspnea.  Chest x-ray was done which showed small pleural effusions as well as atelectasis.   Patient encouraged to keep up with incentive spirometer.  There is also some evidence for third spacing from acute pancreatitis due to  presence of pleural effusion. Respiratory status is stable.  Saturations noted to be in the mid to late 90s.  Titrated down to 3 L.  Acute kidney injury with concern for urinary retention Foley catheter had to be placed.  Creatinine peaked at 1.65.  Noted to be 1.06 today.  Monitor urine output.   Toradol and furosemide were discontinued.  Avoid nephrotoxic agents.    Normocytic anemia Drop in hemoglobin is likely dilutional.  No evidence of overt bleeding.  Hypokalemia Repleted.  Thrombocytosis/leukocytosis Elevated platelet counts were noted at the time of admission.  Likely reactive.  Now normal. Elevated WBC likely due to acute inflammation.  Noted to be afebrile.  History of depression and anxiety Continue Lexapro.  Obesity Estimated body mass index is 30.73 kg/m as calculated from the following:   Height as of 12/27/20: 5\' 7"  (1.702 m).   Weight as of 12/27/20: 89 kg.   DVT Prophylaxis: Lovenox Code Status: Full code Family Communication: Discussed with patient and her mother who was at the bedside. Disposition Plan: Hopefully return home when improved.  Start mobilizing.  Out of bed to chair.  PT evaluation.  Status is: Inpatient  Remains inpatient appropriate because:Ongoing active pain requiring inpatient pain management and IV treatments appropriate due to intensity of illness or inability to take PO  Dispo:  Patient From: Home  Planned Disposition: Home  Medically stable for discharge: No         Medications:  Scheduled:  Chlorhexidine Gluconate Cloth  6 each Topical Daily   enoxaparin (LOVENOX) injection  40 mg Subcutaneous Q24H   escitalopram  10 mg Oral Daily   HYDROmorphone  Intravenous Q4H   ketorolac  30 mg Intravenous Once   pantoprazole (PROTONIX) IV  40 mg Intravenous Q12H   polyethylene glycol  17 g Oral BID   senna  1 tablet Oral Daily   Continuous:  sodium chloride 200 mL/hr at 01/12/21 0247   UQJ:FHLK & mag hydroxide-simeth,  diphenhydrAMINE **OR** diphenhydrAMINE, metoCLOPramide (REGLAN) injection, naloxone **AND** sodium chloride flush, ondansetron (ZOFRAN) IV, oxyCODONE, polyvinyl alcohol   Objective:  Vital Signs  Vitals:   01/12/21 0055 01/12/21 0253 01/12/21 0357 01/12/21 0632  BP: (!) 138/100 (!) 139/93  (!) 152/98  Pulse: (!) 121 (!) 115  (!) 108  Resp: 20 18 18 20   Temp: 98.2 F (36.8 C) (!) 97.4 F (36.3 C)  97.6 F (36.4 C)  TempSrc: Oral Oral  Oral  SpO2: 95% 96% 96% 98%    Intake/Output Summary (Last 24 hours) at 01/12/2021 1008 Last data filed at 01/12/2021 0300 Gross per 24 hour  Intake 4215.58 ml  Output 800 ml  Net 3415.58 ml    There were no vitals filed for this visit.   General appearance: Somnolent but arousable Resp: Poor effort.  Diminished air entry at the bases.  No definite wheezing rhonchi or crackles. Cardio: S1-S2 is tachycardic regular.  Heart rate seems to be better.   No S3-S4.  No rubs murmurs or bruit GI: Abdomen remains soft though tender.  Bowel sounds sluggish.  No masses organomegaly.   Extremities: Mild edema noted to lower extremities. Neurologic: Alert and oriented x3.  No focal neurological deficits.      Lab Results:  Data Reviewed: I have personally reviewed following labs and imaging studies  CBC: Recent Labs  Lab 01/07/21 2050 01/08/21 0500 01/09/21 0556 01/10/21 0543 01/11/21 0612 01/12/21 0540  WBC 17.5* 11.9* 16.7* 15.0* 9.5 10.9*  NEUTROABS 15.5*  --   --   --   --   --   HGB 13.3 14.4 14.1 13.9 11.8* 11.0*  HCT 40.1 44.5 44.3 43.6 37.4 35.5*  MCV 89.5 91.2 92.5 92.0 93.5 92.7  PLT 487* 368 327 303 317 319     Basic Metabolic Panel: Recent Labs  Lab 01/08/21 0500 01/09/21 0556 01/10/21 0543 01/11/21 0612 01/12/21 0540  NA 142 143 141 145 145  K 4.4 4.2 4.2 3.7 3.8  CL 111 112* 112* 116* 118*  CO2 24 24 23 24 23   GLUCOSE 172* 141* 140* 133* 135*  BUN 9 15 33* 35* 32*  CREATININE 0.69 0.90 1.65* 1.28* 1.06*  CALCIUM  8.1* 7.8* 7.7* 8.0* 8.1*  MG  --  1.7  --   --   --      GFR: CrCl cannot be calculated (Unknown ideal weight.).  Liver Function Tests: Recent Labs  Lab 01/07/21 2050 01/09/21 0556 01/10/21 0543 01/11/21 0612 01/12/21 0540  AST 23 30 22 15 16   ALT 40 39 33 24 25  ALKPHOS 79 86 84 81 113  BILITOT 0.4 1.1 1.1 0.8 0.4  PROT 6.2* 5.7* 5.3* 5.3* 5.2*  ALBUMIN 3.6 3.0* 2.6* 2.5* 2.2*     Recent Labs  Lab 01/07/21 2050 01/09/21 0556 01/10/21 0543 01/11/21 0612  LIPASE 4,925* 673* 293* 72*     Recent Results (from the past 240 hour(s))  Resp Panel by RT-PCR (Flu A&B, Covid) Nasopharyngeal Swab     Status: None   Collection Time: 01/08/21  7:49 AM   Specimen: Nasopharyngeal Swab; Nasopharyngeal(NP) swabs in vial transport medium  Result Value Ref Range Status  SARS Coronavirus 2 by RT PCR NEGATIVE NEGATIVE Final    Comment: (NOTE) SARS-CoV-2 target nucleic acids are NOT DETECTED.  The SARS-CoV-2 RNA is generally detectable in upper respiratory specimens during the acute phase of infection. The lowest concentration of SARS-CoV-2 viral copies this assay can detect is 138 copies/mL. A negative result does not preclude SARS-Cov-2 infection and should not be used as the sole basis for treatment or other patient management decisions. A negative result may occur with  improper specimen collection/handling, submission of specimen other than nasopharyngeal swab, presence of viral mutation(s) within the areas targeted by this assay, and inadequate number of viral copies(<138 copies/mL). A negative result must be combined with clinical observations, patient history, and epidemiological information. The expected result is Negative.  Fact Sheet for Patients:  EntrepreneurPulse.com.au  Fact Sheet for Healthcare Providers:  IncredibleEmployment.be  This test is no t yet approved or cleared by the [place] FDA and  has been authorized  for detection and/or diagnosis of SARS-CoV-2 by FDA under an Emergency Use Authorization (EUA). This EUA will remain  in effect (meaning this test can be used) for the duration of the COVID-19 declaration under Section 564(b)(1) of the Act, 21 U.S.C.section 360bbb-3(b)(1), unless the authorization is terminated  or revoked sooner.       Influenza A by PCR NEGATIVE NEGATIVE Final   Influenza B by PCR NEGATIVE NEGATIVE Final    Comment: (NOTE) The Xpert Xpress SARS-CoV-2/FLU/RSV plus assay is intended as an aid in the diagnosis of influenza from Nasopharyngeal swab specimens and should not be used as a sole basis for treatment. Nasal washings and aspirates are unacceptable for Xpert Xpress SARS-CoV-2/FLU/RSV testing.  Fact Sheet for Patients: EntrepreneurPulse.com.au  Fact Sheet for Healthcare Providers: IncredibleEmployment.be  This test is not yet approved or cleared by the [place] FDA and has been authorized for detection and/or diagnosis of SARS-CoV-2 by FDA under an Emergency Use Authorization (EUA). This EUA will remain in effect (meaning this test can be used) for the duration of the COVID-19 declaration under Section 564(b)(1) of the Act, 21 U.S.C. section 360bbb-3(b)(1), unless the authorization is terminated or revoked.  Performed at [facility], [place] [address] [place], [place] [address]       Radiology Studies: No results found.      LOS: 4 days   Jacobey Gura [employer] on [email or web address]  01/12/2021, 10:08 AM

## 2021-01-13 ENCOUNTER — Inpatient Hospital Stay (HOSPITAL_COMMUNITY): Payer: BC Managed Care – PPO

## 2021-01-13 DIAGNOSIS — J9601 Acute respiratory failure with hypoxia: Secondary | ICD-10-CM

## 2021-01-13 DIAGNOSIS — J90 Pleural effusion, not elsewhere classified: Secondary | ICD-10-CM

## 2021-01-13 DIAGNOSIS — K858 Other acute pancreatitis without necrosis or infection: Secondary | ICD-10-CM | POA: Diagnosis not present

## 2021-01-13 LAB — LIPASE, BLOOD: Lipase: 24 U/L (ref 11–51)

## 2021-01-13 LAB — CBC
HCT: 36.6 % (ref 36.0–46.0)
Hemoglobin: 11.5 g/dL — ABNORMAL LOW (ref 12.0–15.0)
MCH: 29.6 pg (ref 26.0–34.0)
MCHC: 31.4 g/dL (ref 30.0–36.0)
MCV: 94.1 fL (ref 80.0–100.0)
Platelets: 319 10*3/uL (ref 150–400)
RBC: 3.89 MIL/uL (ref 3.87–5.11)
RDW: 14.9 % (ref 11.5–15.5)
WBC: 12 10*3/uL — ABNORMAL HIGH (ref 4.0–10.5)
nRBC: 0 % (ref 0.0–0.2)

## 2021-01-13 LAB — COMPREHENSIVE METABOLIC PANEL
ALT: 35 U/L (ref 0–44)
AST: 25 U/L (ref 15–41)
Albumin: 2.1 g/dL — ABNORMAL LOW (ref 3.5–5.0)
Alkaline Phosphatase: 160 U/L — ABNORMAL HIGH (ref 38–126)
Anion gap: 5 (ref 5–15)
BUN: 28 mg/dL — ABNORMAL HIGH (ref 6–20)
CO2: 24 mmol/L (ref 22–32)
Calcium: 8 mg/dL — ABNORMAL LOW (ref 8.9–10.3)
Chloride: 115 mmol/L — ABNORMAL HIGH (ref 98–111)
Creatinine, Ser: 0.92 mg/dL (ref 0.44–1.00)
GFR, Estimated: >60 mL/min (ref >60)
Glucose, Bld: 142 mg/dL — ABNORMAL HIGH (ref 70–99)
Potassium: 3.8 mmol/L (ref 3.5–5.1)
Sodium: 144 mmol/L (ref 135–145)
Total Bilirubin: 0.6 mg/dL (ref 0.3–1.2)
Total Protein: 5.1 g/dL — ABNORMAL LOW (ref 6.5–8.1)

## 2021-01-13 LAB — PHOSPHORUS
Phosphorus: 2 mg/dL — ABNORMAL LOW (ref 2.5–4.6)
Phosphorus: 2.7 mg/dL (ref 2.5–4.6)

## 2021-01-13 LAB — MAGNESIUM
Magnesium: 2.2 mg/dL (ref 1.7–2.4)
Magnesium: 2.1 mg/dL (ref 1.7–2.4)

## 2021-01-13 LAB — MRSA PCR SCREENING: MRSA by PCR: NEGATIVE

## 2021-01-13 LAB — GLUCOSE, CAPILLARY
Glucose-Capillary: 122 mg/dL — ABNORMAL HIGH (ref 70–99)
Glucose-Capillary: 122 mg/dL — ABNORMAL HIGH (ref 70–99)
Glucose-Capillary: 139 mg/dL — ABNORMAL HIGH (ref 70–99)

## 2021-01-13 MED ORDER — HYDROMORPHONE HCL 1 MG/ML IJ SOLN
1.0000 mg | INTRAMUSCULAR | Status: DC | PRN
  Administered 2021-01-13 – 2021-01-14 (×5): 1 mg via INTRAVENOUS
  Filled 2021-01-13 (×5): qty 1

## 2021-01-13 MED ORDER — OSMOLITE 1.5 CAL PO LIQD
1000.0000 mL | ORAL | Status: DC
  Administered 2021-01-13 – 2021-01-17 (×5): 1000 mL
  Filled 2021-01-13 (×11): qty 1000

## 2021-01-13 MED ORDER — CHLORHEXIDINE GLUCONATE 0.12 % MT SOLN
15.0000 mL | Freq: Two times a day (BID) | OROMUCOSAL | Status: DC
  Administered 2021-01-13 – 2021-01-22 (×16): 15 mL via OROMUCOSAL
  Filled 2021-01-13 (×15): qty 15

## 2021-01-13 MED ORDER — FUROSEMIDE 10 MG/ML IJ SOLN
40.0000 mg | Freq: Once | INTRAMUSCULAR | Status: AC
  Administered 2021-01-13: 40 mg via INTRAVENOUS
  Filled 2021-01-13: qty 4

## 2021-01-13 MED ORDER — ORAL CARE MOUTH RINSE
15.0000 mL | Freq: Two times a day (BID) | OROMUCOSAL | Status: DC
  Administered 2021-01-13 – 2021-01-21 (×14): 15 mL via OROMUCOSAL

## 2021-01-13 MED ORDER — PROSOURCE TF PO LIQD
45.0000 mL | Freq: Three times a day (TID) | ORAL | Status: DC
  Administered 2021-01-13 – 2021-01-20 (×19): 45 mL
  Filled 2021-01-13 (×20): qty 45

## 2021-01-13 NOTE — Progress Notes (Signed)
Initial Nutrition Assessment  INTERVENTION:   Monitor magnesium, potassium, and phosphorus daily for at least 3 days, MD to replete as needed, as pt is at risk for refeeding syndrome.  Once post-pyloric NJT placed: -Initiate Osmolite 1.5 @ 20 ml/hr, advance by 10 ml every 12 hours to goal rate of 55 ml/hr. -45 ml Prosource TF TID -Provides 2100 kcals, 115g protein and 1005 ml H2O.  Patient needs weight for admission, daily weights to be ordered with TF protocol.   NUTRITION DIAGNOSIS:   Increased nutrient needs related to acute illness (severe acute pancreatitis) as evidenced by estimated needs.  GOAL:   Patient will meet greater than or equal to 90% of their needs  MONITOR:   Labs, Weight trends, TF tolerance, Diet advancement, I & O's  REASON FOR ASSESSMENT:   Consult Assessment of nutrition requirement/status, Enteral/tube feeding initiation and management  ASSESSMENT:   50 y.o. female with medical history significant for lung nodules, anxiety/depression who presents with worsening abdominal pain following an ERCP.  6/7: s/p ERCP, sphincterectomy, admitted 6/13: Chest x-ray revealed bilateral PEs  Patient currently on clears, has not been able to tolerate full liquids. Had an episode of vomiting last night. This morning had a rapid response d/t tachycardia.   Pt's mother and husband at bedside. Pt able to answer questions but was falling  asleep.  Pt ate normally prior to procedure on 6/7. This was also when she last ate some solid food. Pt did consumed 1/2 an Ensure yesterday.  Per weight records, no weight has been obtained for this admission. Last recorded weight is 195 lbs on 5/27. Daily weights will be ordered with TF protocol.  Per nursing documentation, pt with severe BLE edema.  Medications: Miralax, Senokot  Labs reviewed.  NUTRITION - FOCUSED PHYSICAL EXAM:  No depletions noted.   Diet Order:   Diet Order             Diet clear liquid Room  service appropriate? Yes; Fluid consistency: Thin  Diet effective now                   EDUCATION NEEDS:   No education needs have been identified at this time  Skin:  Skin Assessment: Reviewed RN Assessment  Last BM:  6/13 -type 5  Height:   Ht Readings from Last 1 Encounters:  12/27/20 5\' 7"  (1.702 m)    Weight:   Wt Readings from Last 1 Encounters:  12/27/20 89 kg    BMI:  There is no height or weight on file to calculate BMI.  Estimated Nutritional Needs:   Kcal:  2000-2200  Protein:  120-130g  Fluid:  2L/day   Clayton Bibles, MS, RD, LDN Inpatient Clinical Dietitian Contact information available via Amion

## 2021-01-13 NOTE — Progress Notes (Addendum)
[facility] Gastroenterology Progress Note  Kimberly Kelly 50 y.o. Jul 26, 1971  CC:  Severe acute post-ERCP pancreatitis  Subjective: Patient reports continued severe abdominal pain, which gets worse after drinking liquids.  Had a soft brown BM today.  Reports nausea with one episode of vomiting last night.  Continues to have shortness of breath.  ROS : Review of Systems  Respiratory:  Positive for shortness of breath. Negative for cough.   Gastrointestinal:  Positive for abdominal pain, nausea and vomiting. Negative for blood in stool, constipation, diarrhea, heartburn and melena.    Objective: Vital signs in last 24 hours: Vitals:   01/13/21 0559 01/13/21 0725  BP: (!) 157/89   Pulse: (!) 110   Resp: 18 (!) 22  Temp: 97.7 F (36.5 C)   SpO2: 97% 98%    Physical Exam:  General:  Lethargic, acutely ill-appearing, oriented, cooperative  Head:  Normocephalic, without obvious abnormality, atraumatic  Eyes:  Anicteric sclera, EOMs intact  Lungs:   Tachypneic, Clear to auscultation bilaterally  Heart:  Tachycardic   Abdomen:   Mildly firm, diffusely tender, hypoactive bowel sounds  Extremities: Extremities normal, atraumatic, no  edema    Lab Results: Recent Labs    01/12/21 0540 01/13/21 0648  NA 145 144  K 3.8 3.8  CL 118* 115*  CO2 23 24  GLUCOSE 135* 142*  BUN 32* 28*  CREATININE 1.06* 0.92  CALCIUM 8.1* 8.0*   Recent Labs    01/12/21 0540 01/13/21 0648  AST 16 25  ALT 25 35  ALKPHOS 113 160*  BILITOT 0.4 0.6  PROT 5.2* 5.1*  ALBUMIN 2.2* 2.1*   Recent Labs    01/11/21 0612 01/12/21 0540  WBC 9.5 10.9*  HGB 11.8* 11.0*  HCT 37.4 35.5*  MCV 93.5 92.7  PLT 317 319   No results for input(s): LABPROT, INR in the last 72 hours.  Assessment: Severe acute post-ERCP pancreatitis -Today's CBC pending, WBCs 10.9 yesterday -Elevated BUN, though down-trending (28) with normal Cr -ALP 160, otherwise normal AST/ALT/T. Bili -Normal lipase today  Bilateral  pleural effusions, stable O2 saturations  Plan: IR consult for placement of post-pyloric feeding tube.  Nutrition consult placed for management of dietary requirements. Clear liquids OK.  Continue IVF.  Continue supportive care.  [facility] will follow.  Salley Slaughter PA-C 01/13/2021, 9:10 AM  Contact #  [phone]

## 2021-01-13 NOTE — Progress Notes (Signed)
0720- patient noted to have a respiratory rate of 24-34. Message sent to Dr. Maryland Pink about RR and that the patient feels like she is working hard to breathe and states she has been breathing like this for an hour. RR had resolved to 22 by 0725.  7846- Dr. Maryland Pink and Rapid Response nurse paged due to a change in patient's status. Pt very drowsy, but does wake up to voice and remains A&Ox4. HR sustaining in the 130's. Pt saturating at 74% on 3L and needed to be increased to 6L to maintain 92%. Audible wheezing can be heard and patient has crackles upon auscultation. Rapid response en route to assess.

## 2021-01-13 NOTE — Significant Event (Addendum)
Rapid Response Event Note   Reason for Call : nurse states patient more tachypneic, tachycardic. On 6L O2 now    Initial Focused Assessment: upon assessment, pt is drowsy in bed, wakes up to voice, able to answer orientation questions correctly. Audible wheezing heard in room without listening with stethoscope. Upon auscultation pt has crackles in right lower lobe. Heart rate 120's-130's, O2 93% on 6L, BP 138/95 (107), RR in the 20's-30's      Interventions: messaged doctor with concerns for fluid volume overload. Stopped IV fluids, lasix ordered, transfer orders received.    Plan of Care: patient transferred to SDU for closer monitoring. will administer lasix and continue to monitor respiratory status, patient has been educated on the importance of incentive spirometry use, dilaudid PCA D/C'd due to drowsiness, IV pushes ordered instead     Event Summary:   MD Notified: Maryland Pink  Call Time: [place] Time: 1006 End Time: 1030  Clarene Critchley, RN

## 2021-01-13 NOTE — Progress Notes (Signed)
   01/13/21 0725  Assess: MEWS Score  Resp (!) 22  Level of Consciousness Alert  SpO2 98 %  O2 Device Nasal Cannula  O2 Flow Rate (L/min) 3 L/min  Assess: MEWS Score  MEWS Temp 0  MEWS Systolic 0  MEWS Pulse 1  MEWS RR 1  MEWS LOC 0  MEWS Score 2  MEWS Score Color Yellow  Assess: if the MEWS score is Yellow or Red  Were vital signs taken at a resting state? Yes  Focused Assessment No change from prior assessment  Does the patient meet 2 or more of the SIRS criteria? Yes  Does the patient have a confirmed or suspected source of infection? No  MEWS guidelines implemented *See Row Information* No, previously yellow, continue vital signs every 4 hours  Treat  Pain Scale 0-10  Pain Score 8  Pain Type Acute pain  Pain Location Abdomen  Pain Orientation Mid  Pain Intervention(s) Medication (See eMAR)  Notify: Charge Nurse/RN  Name of Charge Nurse/RN Notified Brutus, RN  Date Charge Nurse/RN Notified 01/13/21  Time Charge Nurse/RN Notified 0730  Document  Progress note created (see row info) Yes  Assess: SIRS CRITERIA  SIRS Temperature  0  SIRS Pulse 1  SIRS Respirations  1  SIRS WBC 0  SIRS Score Sum  2

## 2021-01-13 NOTE — Progress Notes (Signed)
[facility] PROGRESS NOTE   Kimberly Kelly PFX:[id number] DOB: [birth year] DOA: 01/07/2021  PCP: [name], FNP  Brief History/Interval Summary: Female with a past medical history of pulmonary nodules, anxiety and depression who underwent ERCP at [facility] on the day she presented to the hospital.  This was done because of right upper quadrant abdominal pain.  History of cholecystectomy in 2018.  Following the ERCP patient developed abdominal pain.  She presented to the ED and was found to have elevated lipase level.  Diagnosed as acute pancreatitis and was hospitalized for further management.  Reason for Visit: Acute pancreatitis, post ERCP  Consultants: Gastroenterology  Procedures: None yet  Antibiotics: Anti-infectives (From admission, onward)    None       Subjective/Interval History: Patient noted to be more tachypneic this morning.  She admits that she is breathing a little faster than usual.  Her mother is at the bedside.  Patient denies any chest pain.  She mentions that she was able to tolerate more liquids yesterday compared to before.     Assessment/Plan:  Acute pancreatitis, post ERCP This was due to complication of ERCP.  Lipase level was noted to be significantly elevated.  CT findings noted.  Improvement in lipase noted. Continues to follow.  Remains on full liquid diet which he has been tolerating well.  Defer tube feedings to gastroenterology. Patient's lipase level has improved.  Continues to have significant pain.  Gastroenterology continues to follow. Patient mentions that she had a bowel movement overnight. Due to excessive somnolence and worsening respiratory status we will discontinue the PCA for now.  Acute respiratory failure with hypoxia/worsening pleural effusions bilaterally Noted to be more tachypneic this morning.  Chest x-ray was done which shows worsening pleural effusion with some evidence for pulmonary edema.  We will stop her IV  fluids and give her a dose of furosemide.  Noted to be requiring 6 L of oxygen via nasal cannula saturating in the early 90s.  Her somnolence is also contributing.  We will cut back on her narcotics. Transfer to stepdown unit for closer monitoring.  May need to involve critical care medicine Patient encouraged to keep up with incentive spirometer.    Acute kidney injury with concern for urinary retention Foley catheter had to be placed.  Creatinine peaked at 1.65.  Noted to be normal today.  Monitor urine output.    Normocytic anemia Drop in hemoglobin is likely dilutional.  No evidence of overt bleeding.  Hypokalemia Repleted.  Thrombocytosis/leukocytosis Elevated platelet counts were noted at the time of admission.  Likely reactive.  Now normal. Elevated WBC likely due to acute inflammation.  Noted to be afebrile.  History of depression and anxiety Continue Lexapro.  Obesity Estimated body mass index is 30.73 kg/m as calculated from the following:   Height as of 12/27/20: 5\' 7"  (1.702 m).   Weight as of 12/27/20: 89 kg.   DVT Prophylaxis: Lovenox Code Status: Full code Family Communication: Discussed with patient's mother and husband at bedside. Disposition Plan: To be determined.  Status is: Inpatient  Remains inpatient appropriate because:Ongoing active pain requiring inpatient pain management and IV treatments appropriate due to intensity of illness or inability to take PO  Dispo:  Patient From: Home  Planned Disposition: Home  Medically stable for discharge: No         Medications:  Scheduled:  chlorhexidine  15 mL Mouth Rinse BID   Chlorhexidine Gluconate Cloth  6 each Topical Daily  enoxaparin (LOVENOX) injection  40 mg Subcutaneous Q24H   escitalopram  10 mg Oral Daily   HYDROmorphone   Intravenous Q4H   mouth rinse  15 mL Mouth Rinse q12n4p   pantoprazole (PROTONIX) IV  40 mg Intravenous Q12H   polyethylene glycol  17 g Oral BID   senna  1 tablet Oral  Daily   Continuous:   ZWC:HENI & mag hydroxide-simeth, diphenhydrAMINE **OR** diphenhydrAMINE, metoCLOPramide (REGLAN) injection, naloxone **AND** sodium chloride flush, ondansetron (ZOFRAN) IV, polyvinyl alcohol   Objective:  Vital Signs  Vitals:   01/13/21 0224 01/13/21 0412 01/13/21 0559 01/13/21 0725  BP: (!) 139/93  (!) 157/89   Pulse: (!) 109  (!) 110   Resp: 18 15 18  (!) 22  Temp: 99 F (37.2 C)  97.7 F (36.5 C)   TempSrc: Oral  Oral   SpO2: 96% 95% 97% 98%    Intake/Output Summary (Last 24 hours) at 01/13/2021 1104 Last data filed at 01/13/2021 0615 Gross per 24 hour  Intake 4128.91 ml  Output 1300 ml  Net 2828.91 ml    There were no vitals filed for this visit.   General appearance: Somnolent but easily arousable. Resp: Noted to be tachypneic.  Diminished air entry at the bases with crackles bilaterally.  No wheezing appreciated.   Cardio: S1-S2 is tachycardic regular.  No S3-S4.  No rubs murmurs or bruit GI: Abdomen is soft.  Tender in the upper abdomen.  Bowel sounds are heard today.   Extremities: Worsening edema noted in both legs today. Neurologic:  No focal neurological deficits.      Lab Results:  Data Reviewed: I have personally reviewed following labs and imaging studies  CBC: Recent Labs  Lab 01/07/21 2050 01/08/21 0500 01/09/21 0556 01/10/21 0543 01/11/21 0612 01/12/21 0540 01/13/21 0648  WBC 17.5*   < > 16.7* 15.0* 9.5 10.9* 12.0*  NEUTROABS 15.5*  --   --   --   --   --   --   HGB 13.3   < > 14.1 13.9 11.8* 11.0* 11.5*  HCT 40.1   < > 44.3 43.6 37.4 35.5* 36.6  MCV 89.5   < > 92.5 92.0 93.5 92.7 94.1  PLT 487*   < > 327 303 317 319 319   < > = values in this interval not displayed.     Basic Metabolic Panel: Recent Labs  Lab 01/09/21 0556 01/10/21 0543 01/11/21 0612 01/12/21 0540 01/13/21 0648  NA 143 141 145 145 144  K 4.2 4.2 3.7 3.8 3.8  CL 112* 112* 116* 118* 115*  CO2 24 23 24 23 24   GLUCOSE 141* 140* 133* 135*  142*  BUN 15 33* 35* 32* 28*  CREATININE 0.90 1.65* 1.28* 1.06* 0.92  CALCIUM 7.8* 7.7* 8.0* 8.1* 8.0*  MG 1.7  --   --   --   --      GFR: CrCl cannot be calculated (Unknown ideal weight.).  Liver Function Tests: Recent Labs  Lab 01/09/21 0556 01/10/21 0543 01/11/21 0612 01/12/21 0540 01/13/21 0648  AST 30 22 15 16 25   ALT 39 33 24 25 35  ALKPHOS 86 84 81 113 160*  BILITOT 1.1 1.1 0.8 0.4 0.6  PROT 5.7* 5.3* 5.3* 5.2* 5.1*  ALBUMIN 3.0* 2.6* 2.5* 2.2* 2.1*     Recent Labs  Lab 01/07/21 2050 01/09/21 0556 01/10/21 0543 01/11/21 0612 01/13/21 0648  LIPASE 4,925* 673* 293* 72* 24     Recent Results (from the past  240 hour(s))  Resp Panel by RT-PCR (Flu A&B, Covid) Nasopharyngeal Swab     Status: None   Collection Time: 01/08/21  7:49 AM   Specimen: Nasopharyngeal Swab; Nasopharyngeal(NP) swabs in vial transport medium  Result Value Ref Range Status   SARS Coronavirus 2 by RT PCR NEGATIVE NEGATIVE Final    Comment: (NOTE) SARS-CoV-2 target nucleic acids are NOT DETECTED.  The SARS-CoV-2 RNA is generally detectable in upper respiratory specimens during the acute phase of infection. The lowest concentration of SARS-CoV-2 viral copies this assay can detect is 138 copies/mL. A negative result does not preclude SARS-Cov-2 infection and should not be used as the sole basis for treatment or other patient management decisions. A negative result may occur with  improper specimen collection/handling, submission of specimen other than nasopharyngeal swab, presence of viral mutation(s) within the areas targeted by this assay, and inadequate number of viral copies(<138 copies/mL). A negative result must be combined with clinical observations, patient history, and epidemiological information. The expected result is Negative.  Fact Sheet for Patients:  EntrepreneurPulse.com.au  Fact Sheet for Healthcare Providers:   IncredibleEmployment.be  This test is no t yet approved or cleared by the [place] FDA and  has been authorized for detection and/or diagnosis of SARS-CoV-2 by FDA under an Emergency Use Authorization (EUA). This EUA will remain  in effect (meaning this test can be used) for the duration of the COVID-19 declaration under Section 564(b)(1) of the Act, 21 U.S.C.section 360bbb-3(b)(1), unless the authorization is terminated  or revoked sooner.       Influenza A by PCR NEGATIVE NEGATIVE Final   Influenza B by PCR NEGATIVE NEGATIVE Final    Comment: (NOTE) The Xpert Xpress SARS-CoV-2/FLU/RSV plus assay is intended as an aid in the diagnosis of influenza from Nasopharyngeal swab specimens and should not be used as a sole basis for treatment. Nasal washings and aspirates are unacceptable for Xpert Xpress SARS-CoV-2/FLU/RSV testing.  Fact Sheet for Patients: EntrepreneurPulse.com.au  Fact Sheet for Healthcare Providers: IncredibleEmployment.be  This test is not yet approved or cleared by the [place] FDA and has been authorized for detection and/or diagnosis of SARS-CoV-2 by FDA under an Emergency Use Authorization (EUA). This EUA will remain in effect (meaning this test can be used) for the duration of the COVID-19 declaration under Section 564(b)(1) of the Act, 21 U.S.C. section 360bbb-3(b)(1), unless the authorization is terminated or revoked.  Performed at [facility], [place] [address], [place], [place] [address]       Radiology Studies: DG CHEST PORT 1 VIEW  Result Date: 01/13/2021 CLINICAL DATA:  Shortness of breath EXAM: PORTABLE CHEST 1 VIEW COMPARISON:  January 09, 2021 FINDINGS: There are pleural effusions bilaterally with atelectasis and patchy infiltrate in each lower lung region. Heart is borderline enlarged with pulmonary vascularity normal. No evident adenopathy. No pneumothorax. No bone  lesions. IMPRESSION: Pleural effusions bilaterally. Atelectasis and questionable superimposed pneumonia in the lower lung regions. Stable cardiac prominence. Electronically Signed   By: Lowella Grip III M.D.   On: 01/13/2021 09:23        LOS: 5 days   [facility] Pager on [email or web address]  01/13/2021, 11:04 AM

## 2021-01-13 NOTE — Progress Notes (Signed)
PT Cancellation Note  Patient Details Name: EDIT RICCIARDELLI MRN: [id number] DOB: 1971/02/09   Cancelled Treatment:    Reason Eval/Treat Not Completed: Medical issues which prohibited therapy (transferred to ICU)  Arlyce Dice, DPT Acute Rehabilitation Services Pager: [phone] Office: [phone]  York Ram E 01/13/2021, 10:38 AM

## 2021-01-14 DIAGNOSIS — J9601 Acute respiratory failure with hypoxia: Secondary | ICD-10-CM | POA: Diagnosis not present

## 2021-01-14 DIAGNOSIS — J90 Pleural effusion, not elsewhere classified: Secondary | ICD-10-CM | POA: Diagnosis not present

## 2021-01-14 DIAGNOSIS — K858 Other acute pancreatitis without necrosis or infection: Secondary | ICD-10-CM | POA: Diagnosis not present

## 2021-01-14 LAB — GLUCOSE, CAPILLARY
Glucose-Capillary: 142 mg/dL — ABNORMAL HIGH (ref 70–99)
Glucose-Capillary: 147 mg/dL — ABNORMAL HIGH (ref 70–99)
Glucose-Capillary: 175 mg/dL — ABNORMAL HIGH (ref 70–99)
Glucose-Capillary: 186 mg/dL — ABNORMAL HIGH (ref 70–99)
Glucose-Capillary: 180 mg/dL — ABNORMAL HIGH (ref 70–99)

## 2021-01-14 LAB — CBC
HCT: 34.4 % — ABNORMAL LOW (ref 36.0–46.0)
Hemoglobin: 11.1 g/dL — ABNORMAL LOW (ref 12.0–15.0)
MCH: 29.1 pg (ref 26.0–34.0)
MCHC: 32.3 g/dL (ref 30.0–36.0)
MCV: 90.3 fL (ref 80.0–100.0)
Platelets: 292 10*3/uL (ref 150–400)
RBC: 3.81 MIL/uL — ABNORMAL LOW (ref 3.87–5.11)
RDW: 14.6 % (ref 11.5–15.5)
WBC: 11.9 10*3/uL — ABNORMAL HIGH (ref 4.0–10.5)
nRBC: 0 % (ref 0.0–0.2)

## 2021-01-14 LAB — COMPREHENSIVE METABOLIC PANEL
ALT: 37 U/L (ref 0–44)
AST: 27 U/L (ref 15–41)
Albumin: 2 g/dL — ABNORMAL LOW (ref 3.5–5.0)
Alkaline Phosphatase: 159 U/L — ABNORMAL HIGH (ref 38–126)
Anion gap: 6 (ref 5–15)
BUN: 24 mg/dL — ABNORMAL HIGH (ref 6–20)
CO2: 30 mmol/L (ref 22–32)
Calcium: 8 mg/dL — ABNORMAL LOW (ref 8.9–10.3)
Chloride: 107 mmol/L (ref 98–111)
Creatinine, Ser: 1.02 mg/dL — ABNORMAL HIGH (ref 0.44–1.00)
GFR, Estimated: >60 mL/min (ref >60)
Glucose, Bld: 168 mg/dL — ABNORMAL HIGH (ref 70–99)
Potassium: 3 mmol/L — ABNORMAL LOW (ref 3.5–5.1)
Sodium: 143 mmol/L (ref 135–145)
Total Bilirubin: 0.4 mg/dL (ref 0.3–1.2)
Total Protein: 5 g/dL — ABNORMAL LOW (ref 6.5–8.1)

## 2021-01-14 LAB — PHOSPHORUS
Phosphorus: 2.8 mg/dL (ref 2.5–4.6)
Phosphorus: 2.5 mg/dL (ref 2.5–4.6)

## 2021-01-14 LAB — MAGNESIUM
Magnesium: 1.9 mg/dL (ref 1.7–2.4)
Magnesium: 1.9 mg/dL (ref 1.7–2.4)

## 2021-01-14 MED ORDER — POLYETHYLENE GLYCOL 3350 17 G PO PACK: 17.0000 g | PACK | Freq: Every day | ORAL | Status: DC | PRN

## 2021-01-14 MED ORDER — POTASSIUM CHLORIDE 20 MEQ PO PACK
40.0000 meq | PACK | ORAL | Status: AC
  Administered 2021-01-14 (×2): 40 meq via ORAL
  Filled 2021-01-14 (×2): qty 2

## 2021-01-14 MED ORDER — SENNA 8.6 MG PO TABS
1.0000 | ORAL_TABLET | Freq: Every day | ORAL | Status: DC | PRN
  Filled 2021-01-14: qty 1

## 2021-01-14 MED ORDER — HYDROMORPHONE HCL 1 MG/ML IJ SOLN
1.0000 mg | INTRAMUSCULAR | Status: DC | PRN
  Administered 2021-01-14 – 2021-01-17 (×23): 1 mg via INTRAVENOUS
  Filled 2021-01-14 (×23): qty 1

## 2021-01-14 MED ORDER — SIMETHICONE 80 MG PO CHEW
80.0000 mg | CHEWABLE_TABLET | Freq: Once | ORAL | Status: AC
  Administered 2021-01-14: 80 mg via ORAL
  Filled 2021-01-14: qty 1

## 2021-01-14 MED ORDER — FUROSEMIDE 10 MG/ML IJ SOLN
40.0000 mg | Freq: Once | INTRAMUSCULAR | Status: AC
  Administered 2021-01-14: 40 mg via INTRAVENOUS
  Filled 2021-01-14: qty 4

## 2021-01-14 MED ORDER — INSULIN ASPART 100 UNIT/ML IJ SOLN
0.0000 [IU] | INTRAMUSCULAR | Status: DC
  Administered 2021-01-14 – 2021-01-15 (×3): 2 [IU] via SUBCUTANEOUS
  Administered 2021-01-15: 3 [IU] via SUBCUTANEOUS
  Administered 2021-01-15 (×3): 2 [IU] via SUBCUTANEOUS
  Administered 2021-01-15: 1 [IU] via SUBCUTANEOUS
  Administered 2021-01-16 (×3): 2 [IU] via SUBCUTANEOUS
  Administered 2021-01-16 (×2): 3 [IU] via SUBCUTANEOUS
  Administered 2021-01-16: 2 [IU] via SUBCUTANEOUS
  Administered 2021-01-16 – 2021-01-17 (×2): 3 [IU] via SUBCUTANEOUS

## 2021-01-14 NOTE — Progress Notes (Signed)
[facility] PROGRESS NOTE   Kimberly Kelly SEG:[id number] DOB: 09-Aug-1970 DOA: 01/07/2021  PCP: [name], FNP  Brief History/Interval Summary: Female with a past medical history of pulmonary nodules, anxiety and depression who underwent ERCP at [facility] on the day she presented to the hospital.  This was done because of right upper quadrant abdominal pain.  History of cholecystectomy in 2018.  Following the ERCP patient developed abdominal pain.  She presented to the ED and was found to have elevated lipase level.  Diagnosed as acute pancreatitis and was hospitalized for further management.  Reason for Visit: Acute pancreatitis, post ERCP  Consultants: Gastroenterology  Procedures: None yet  Antibiotics: Anti-infectives (From admission, onward)    None       Subjective/Interval History: Patient mentions that she is feeling better from a respiratory standpoint.  Breathing easier.  Had some abdominal discomfort after tube feedings were initiated yesterday evening.  Tube feedings were held overnight.  Has not had any nausea or vomiting.  Pain still not adequately controlled.     Assessment/Plan:  Acute pancreatitis, post ERCP This was due to complication of ERCP.  Lipase level was noted to be significantly elevated.  CT findings noted.  Improvement in lipase noted. Gastroenterology continues to follow. Postpyloric feeding tube was placed yesterday.  Experience some discomfort when feedings were initiated yesterday.  Could reattempt today.  Has not had any nausea vomiting. Increased frequency of Dilaudid as needed. Patient has been passing flatus and has had bowel movements.  Acute respiratory failure with hypoxia/worsening pleural effusions bilaterally Patient was noted to be quite tachypneic yesterday morning.  Chest x-ray showed worsening bilateral pleural effusion with some concern for pulmonary edema.  She was given furosemide yesterday with good diuresis of  more than 5 L.  She was on aggressive IV hydration for pancreatitis which is one of the reasons for her dyspnea and fluid overload.  Continue to hold IV fluids.  Replace her potassium.  Give additional dose of furosemide and then reassess volume status tomorrow. Continue with incentive spirometer.  Mobilization.    Acute kidney injury with concern for urinary retention Foley catheter had to be placed due to retention.  Creatinine peaked at 1.65.   Creatinine stable for the most part.  Will replace potassium which is low likely from the diuretic she received yesterday.  Continue to monitor urine output.  She diuresed well yesterday.  Normocytic anemia Drop in hemoglobin is likely dilutional.  No evidence of overt bleeding.  Hypokalemia Will be repleted.  Magnesium 1.9.  Thrombocytosis/leukocytosis Elevated platelet counts were noted at the time of admission.  Likely reactive.  Now normal. Elevated WBC likely due to acute inflammation.  Noted to be afebrile.  History of depression and anxiety Continue Lexapro.  Obesity Estimated body mass index is 36.19 kg/m as calculated from the following:   Height as of 12/27/20: 5\' 7"  (1.702 m).   Weight as of this encounter: 104.8 kg.   DVT Prophylaxis: Lovenox Code Status: Full code Family Communication: Discussed with patient's mother and patient at bedside. Disposition Plan: To be determined.  Start mobilizing.  PT and OT.  Status is: Inpatient  Remains inpatient appropriate because:Ongoing active pain requiring inpatient pain management and IV treatments appropriate due to intensity of illness or inability to take PO  Dispo:  Patient From: Home  Planned Disposition: Home  Medically stable for discharge: No         Medications:  Scheduled:  chlorhexidine  15 mL  Mouth Rinse BID   Chlorhexidine Gluconate Cloth  6 each Topical Daily   enoxaparin (LOVENOX) injection  40 mg Subcutaneous Q24H   escitalopram  10 mg Oral Daily    feeding supplement (PROSource TF)  45 mL Per Tube TID   mouth rinse  15 mL Mouth Rinse q12n4p   pantoprazole (PROTONIX) IV  40 mg Intravenous Q12H   polyethylene glycol  17 g Oral BID   potassium chloride  40 mEq Oral Q4H   senna  1 tablet Oral Daily   Continuous:  feeding supplement (OSMOLITE 1.5 CAL) 1,000 mL (01/14/21 0901)    WUJ:WJXB & mag hydroxide-simeth, HYDROmorphone (DILAUDID) injection, metoCLOPramide (REGLAN) injection, ondansetron (ZOFRAN) IV, polyvinyl alcohol   Objective:  Vital Signs  Vitals:   01/14/21 0600 01/14/21 0700 01/14/21 0800 01/14/21 0843  BP: (!) 149/94 (!) 142/75 (!) 146/79   Pulse: (!) 101 (!) 105 100 99  Resp: (!) 23 16 19 20   Temp:   (!) 97.2 F (36.2 C)   TempSrc:   Oral   SpO2: 98% 97% 99% 91%  Weight:        Intake/Output Summary (Last 24 hours) at 01/14/2021 0913 Last data filed at 01/14/2021 0500 Gross per 24 hour  Intake 0 ml  Output 5550 ml  Net -5550 ml    Filed Weights   01/14/21 0500  Weight: 104.8 kg     General appearance: Much more awake and alert this morning.  In no distress Resp: Improved effort.  Less tachypneic compared to yesterday.  Improved air entry bilaterally.  No wheezing Cardio: S1-S2 is normal regular.  Heart rate has improved.  No S3-S4.  No rubs murmurs or bruit GI: Abdomen is soft remains tender in the epigastric area without any rebound rigidity.  Mild guarding is present.  Bowel sounds are heard today.  No masses organomegaly  Extremities: Continues to have significant edema bilateral lower extremities but perhaps slightly better today compared to yesterday Neurologic: Alert and oriented x3.  No focal neurological deficits.        Lab Results:  Data Reviewed: I have personally reviewed following labs and imaging studies  CBC: Recent Labs  Lab 01/07/21 2050 01/08/21 0500 01/10/21 0543 01/11/21 0612 01/12/21 0540 01/13/21 0648 01/14/21 0723  WBC 17.5*   < > 15.0* 9.5 10.9* 12.0* 11.9*   NEUTROABS 15.5*  --   --   --   --   --   --   HGB 13.3   < > 13.9 11.8* 11.0* 11.5* 11.1*  HCT 40.1   < > 43.6 37.4 35.5* 36.6 34.4*  MCV 89.5   < > 92.0 93.5 92.7 94.1 90.3  PLT 487*   < > 303 317 319 319 292   < > = values in this interval not displayed.     Basic Metabolic Panel: Recent Labs  Lab 01/09/21 0556 01/10/21 0543 01/11/21 0612 01/12/21 0540 01/13/21 0648 01/13/21 1707 01/14/21 0252 01/14/21 0723  NA 143 141 145 145 144  --   --  143  K 4.2 4.2 3.7 3.8 3.8  --   --  3.0*  CL 112* 112* 116* 118* 115*  --   --  107  CO2 24 23 24 23 24   --   --  30  GLUCOSE 141* 140* 133* 135* 142*  --   --  168*  BUN 15 33* 35* 32* 28*  --   --  24*  CREATININE 0.90 1.65* 1.28* 1.06* 0.92  --   --  1.02*  CALCIUM 7.8* 7.7* 8.0* 8.1* 8.0*  --   --  8.0*  MG 1.7  --   --   --  2.2 2.1 1.9  --   PHOS  --   --   --   --  2.0* 2.7 2.8  --      GFR: Estimated Creatinine Clearance: 82.2 mL/min (A) (by C-G formula based on SCr of 1.02 mg/dL (H)).  Liver Function Tests: Recent Labs  Lab 01/10/21 0543 01/11/21 0612 01/12/21 0540 01/13/21 0648 01/14/21 0723  AST 22 15 16 25 27   ALT 33 24 25 35 37  ALKPHOS 84 81 113 160* 159*  BILITOT 1.1 0.8 0.4 0.6 0.4  PROT 5.3* 5.3* 5.2* 5.1* 5.0*  ALBUMIN 2.6* 2.5* 2.2* 2.1* 2.0*     Recent Labs  Lab 01/07/21 2050 01/09/21 0556 01/10/21 0543 01/11/21 0612 01/13/21 0648  LIPASE 4,925* 673* 293* 72* 24     Recent Results (from the past 240 hour(s))  Resp Panel by RT-PCR (Flu A&B, Covid) Nasopharyngeal Swab     Status: None   Collection Time: 01/08/21  7:49 AM   Specimen: Nasopharyngeal Swab; Nasopharyngeal(NP) swabs in vial transport medium  Result Value Ref Range Status   SARS Coronavirus 2 by RT PCR NEGATIVE NEGATIVE Final    Comment: (NOTE) SARS-CoV-2 target nucleic acids are NOT DETECTED.  The SARS-CoV-2 RNA is generally detectable in upper respiratory specimens during the acute phase of infection. The  lowest concentration of SARS-CoV-2 viral copies this assay can detect is 138 copies/mL. A negative result does not preclude SARS-Cov-2 infection and should not be used as the sole basis for treatment or other patient management decisions. A negative result may occur with  improper specimen collection/handling, submission of specimen other than nasopharyngeal swab, presence of viral mutation(s) within the areas targeted by this assay, and inadequate number of viral copies(<138 copies/mL). A negative result must be combined with clinical observations, patient history, and epidemiological information. The expected result is Negative.  Fact Sheet for Patients:  EntrepreneurPulse.com.au  Fact Sheet for Healthcare Providers:  IncredibleEmployment.be  This test is no t yet approved or cleared by the [place] FDA and  has been authorized for detection and/or diagnosis of SARS-CoV-2 by FDA under an Emergency Use Authorization (EUA). This EUA will remain  in effect (meaning this test can be used) for the duration of the COVID-19 declaration under Section 564(b)(1) of the Act, 21 U.S.C.section 360bbb-3(b)(1), unless the authorization is terminated  or revoked sooner.       Influenza A by PCR NEGATIVE NEGATIVE Final   Influenza B by PCR NEGATIVE NEGATIVE Final    Comment: (NOTE) The Xpert Xpress SARS-CoV-2/FLU/RSV plus assay is intended as an aid in the diagnosis of influenza from Nasopharyngeal swab specimens and should not be used as a sole basis for treatment. Nasal washings and aspirates are unacceptable for Xpert Xpress SARS-CoV-2/FLU/RSV testing.  Fact Sheet for Patients: EntrepreneurPulse.com.au  Fact Sheet for Healthcare Providers: IncredibleEmployment.be  This test is not yet approved or cleared by the [place] FDA and has been authorized for detection and/or diagnosis of SARS-CoV-2 by FDA under  an Emergency Use Authorization (EUA). This EUA will remain in effect (meaning this test can be used) for the duration of the COVID-19 declaration under Section 564(b)(1) of the Act, 21 U.S.C. section 360bbb-3(b)(1), unless the authorization is terminated or revoked.  Performed at [facility], [place] [address], [place], [place] [address]   MRSA PCR  Screening     Status: None   Collection Time: 01/13/21 10:45 AM   Specimen: Nasal Mucosa; Nasopharyngeal  Result Value Ref Range Status   MRSA by PCR NEGATIVE NEGATIVE Final    Comment:        The GeneXpert MRSA Assay (FDA approved for NASAL specimens only), is one component of a comprehensive MRSA colonization surveillance program. It is not intended to diagnose MRSA infection nor to guide or monitor treatment for MRSA infections. Performed at [facility], [place] [address] [place], [place] [address]       Radiology Studies: DG Abd 1 View  Result Date: 01/13/2021 CLINICAL DATA:  Check Dobbhoff placement EXAM: ABDOMEN - 1 VIEW COMPARISON:  None. FINDINGS: Weighted feeding catheter is noted in the second portion of the duodenum. Scattered large and small bowel gas is noted. IMPRESSION: Dobbhoff catheter within the second portion of the duodenum. Electronically Signed   By: Inez Catalina M.D.   On: 01/13/2021 15:15   DG CHEST PORT 1 VIEW  Result Date: 01/13/2021 CLINICAL DATA:  Shortness of breath EXAM: PORTABLE CHEST 1 VIEW COMPARISON:  January 09, 2021 FINDINGS: There are pleural effusions bilaterally with atelectasis and patchy infiltrate in each lower lung region. Heart is borderline enlarged with pulmonary vascularity normal. No evident adenopathy. No pneumothorax. No bone lesions. IMPRESSION: Pleural effusions bilaterally. Atelectasis and questionable superimposed pneumonia in the lower lung regions. Stable cardiac prominence. Electronically Signed   By: Lowella Grip III M.D.   On: 01/13/2021 09:23         LOS: 6 days   [facility] Pager on [email or web address]  01/14/2021, 9:13 AM

## 2021-01-14 NOTE — Evaluation (Signed)
Physical Therapy Evaluation Patient Details Name: Kimberly Kelly MRN: [id number] DOB: Mar 10, 1971 Today's Date: 01/14/2021   History of Present Illness  50 y.o. female admitted s/p ERCP with acute pancratitis.   PMH includes: anxiety, lung nodules, diverticulosis, s/p cholectystectomy, frozen shoulder Lt.  Clinical Impression  On eval, pt required Min A +2 for safety/equipment for mobility. She walked ~50 feet with a RW. Pain rated 9/10 with activity. Pt presents with general weakness, decreased activity tolerance, and impaired gait and balance. Will plan to follow and progress activity as tolerated. O2 87% on 2L with ambulation.     Follow Up Recommendations Home health PT    Equipment Recommendations  Rolling walker with 5" wheels    Recommendations for Other Services       Precautions / Restrictions Precautions Precautions: Fall Precaution Comments: monitor O2/HR; NG tube; Restrictions Weight Bearing Restrictions: No      Mobility  Bed Mobility               General bed mobility comments: in chair upon arrival    Transfers Overall transfer level: Needs assistance Equipment used: Rolling walker (2 wheeled) Transfers: Sit to/from Stand Sit to Stand: Min guard        General transfer comment: Min guard for safety. Cues for hand placement. Increased time.  Ambulation/Gait Ambulation/Gait assistance: Min assist;+2 safety/equipment Gait Distance (Feet): 50 Feet Assistive device: Rolling walker (2 wheeled) Gait Pattern/deviations: Step-through pattern;Decreased stride length     General Gait Details: slow, effortful gait. remained on [place] O2-sats 87% on 2L, dyspnea 2/4. followed with recliner  Stairs            Wheelchair Mobility    Modified Rankin (Stroke Patients Only)       Balance Overall balance assessment: Needs assistance Sitting-balance support: Feet supported Sitting balance-Leahy Scale: Good     Standing balance support: Bilateral  upper extremity supported Standing balance-Leahy Scale: Poor Standing balance comment: holds onto either chair or bedside commode with unilateral extremity to steady during transfer                             Pertinent Vitals/Pain Pain Assessment: 0-10 Pain Score: 9  Faces Pain Scale: Hurts whole lot Pain Location: abdomen, lower back Pain Descriptors / Indicators: Discomfort;Grimacing;Guarding;Moaning Pain Intervention(s): Limited activity within patient's tolerance;Monitored during session;Repositioned;Patient requesting pain meds-RN notified    Home Living Family/patient expects to be discharged to:: Private residence Living Arrangements: Spouse/significant other;Children Available Help at Discharge: Family;Available 24 hours/day Type of Home: House Home Access: Stairs to enter   [employer] of Steps: 3-4 Home Layout: Two level Home Equipment: None Additional Comments: Pt lives with visually impaired spouse, as well as 27 and 67 y.o children.  Her son is also visual impaired    Prior Function Level of Independence: Independent         Comments: Pt reports she was fully independent PTA.  Does have a h/o of Lt frozen shoulder     Hand Dominance        Extremity/Trunk Assessment   Upper Extremity Assessment Upper Extremity Assessment: Defer to OT evaluation    Lower Extremity Assessment Lower Extremity Assessment: Generalized weakness    Cervical / Trunk Assessment Cervical / Trunk Assessment: Normal  Communication   Communication: No difficulties  Cognition Arousal/Alertness: Awake/alert Behavior During Therapy: WFL for tasks assessed/performed Overall Cognitive Status: Within Functional Limits for tasks assessed  General Comments General comments (skin integrity, edema, etc.): HR up to 122 with standing activity likely 2* pain, O2 remained stable on 2L    Exercises      Assessment/Plan    PT Assessment Patient needs continued PT services  PT Problem List Decreased strength;Decreased mobility;Decreased activity tolerance;Decreased balance;Decreased knowledge of use of DME;Pain       PT Treatment Interventions DME instruction;Gait training;Therapeutic exercise;Balance training;Functional mobility training;Therapeutic activities;Patient/family education    PT Goals (Current goals can be found in the Care Plan section)  Acute Rehab PT Goals Patient Stated Goal: to have less pain and get back to normal PT Goal Formulation: With patient/family Time For Goal Achievement: 01/28/21 Potential to Achieve Goals: Good    Frequency Min 3X/week   Barriers to discharge        Co-evaluation               AM-PAC PT "6 Clicks" Mobility  Outcome Measure Help needed turning from your back to your side while in a flat bed without using bedrails?: A Little Help needed moving from lying on your back to sitting on the side of a flat bed without using bedrails?: A Little Help needed moving to and from a bed to a chair (including a wheelchair)?: A Little Help needed standing up from a chair using your arms (e.g., wheelchair or bedside chair)?: A Little Help needed to walk in hospital room?: A Little Help needed climbing 3-5 steps with a railing? : A Lot 6 Click Score: 17    End of Session Equipment Utilized During Treatment: Oxygen Activity Tolerance: Patient limited by fatigue;Patient limited by pain Patient left: in chair;with call bell/phone within reach;with family/visitor present   PT Visit Diagnosis: Muscle weakness (generalized) (M62.81);Pain    Time: 1423-1450 PT Time Calculation (min) (ACUTE ONLY): 27 min   Charges:   PT Evaluation $PT Eval Moderate Complexity: 1 Mod PT Treatments $Gait Training: 8-22 mins         Doreatha Massed, PT Acute Rehabilitation  Office: [phone] Pager: [phone]

## 2021-01-14 NOTE — Progress Notes (Signed)
[facility] Gastroenterology Progress Note  Kimberly WINQUIST 50 y.o. 1971/02/27  CC:  Severe acute post-ERCP pancreatitis  Subjective: Patient reports improvement in breathing, no current shortness of breath.  Reports continued abdominal pain, currently 9/10. Denies nausea/vomiting and is tolerating clear fluids.  Had some loose BMs this morning.  ROS : Review of Systems  Respiratory:  Negative for cough and shortness of breath.   Gastrointestinal:  Positive for abdominal pain. Negative for blood in stool, constipation, diarrhea, heartburn, melena, nausea and vomiting.    Objective: Vital signs in last 24 hours: Vitals:   01/14/21 0600 01/14/21 0800  BP: (!) 149/94   Pulse: (!) 101   Resp: (!) 23   Temp:  (!) 97.2 F (36.2 C)  SpO2: 98%     Physical Exam:  General:  Lethargic, acutely ill-appearing, oriented, cooperative  Head:  Normocephalic, without obvious abnormality, atraumatic  Eyes:  Anicteric sclera, EOMs intact  Lungs:   Breathing comfortably on room air  Heart:  Tachycardic   Abdomen:   Mildly firm, diffusely tender, normoactive bowel sounds  Extremities: Extremities normal, atraumatic, no  edema    Lab Results: Recent Labs    01/13/21 0648 01/13/21 1707 01/14/21 0252 01/14/21 0723  NA 144  --   --  143  K 3.8  --   --  3.0*  CL 115*  --   --  107  CO2 24  --   --  30  GLUCOSE 142*  --   --  168*  BUN 28*  --   --  24*  CREATININE 0.92  --   --  1.02*  CALCIUM 8.0*  --   --  8.0*  MG 2.2 2.1 1.9  --   PHOS 2.0* 2.7 2.8  --     Recent Labs    01/13/21 0648 01/14/21 0723  AST 25 27  ALT 35 37  ALKPHOS 160* 159*  BILITOT 0.6 0.4  PROT 5.1* 5.0*  ALBUMIN 2.1* 2.0*    Recent Labs    01/13/21 0648 01/14/21 0723  WBC 12.0* 11.9*  HGB 11.5* 11.1*  HCT 36.6 34.4*  MCV 94.1 90.3  PLT 319 292    No results for input(s): LABPROT, INR in the last 72 hours.  Assessment: Severe acute post-ERCP pancreatitis -WBCs 11.9 -Elevated BUN, though  down-trending (24) with Cr 1.02, increased from 0.92 yesterday -ALP 159, otherwise normal AST/ALT/T. Bili  Bilateral pleural effusions  Plan: Continue tube feeds with NJ tube.  Continue clear liquid diet.  Continue supportive care. Continue to trend renal function.  [facility] will follow.  Salley Slaughter PA-C 01/14/2021, 8:42 AM  Contact #  [phone]

## 2021-01-14 NOTE — Progress Notes (Addendum)
Occupational Therapy Treatment Patient Details Name: Kimberly Kelly MRN: [id number] DOB: 1971/04/28 Today's Date: 01/14/2021    History of present illness This 50 y.o. female admitted s/p ERCP with acute pancratitis.   PMH includes: anxiety, lung nodules, diverticulosis, s/p cholectystectomy, frozen shoulder Lt.   OT comments  Patient transferred to ICU since previous OT session. Have been working on pain control with MD/nursing however reports her pain in abdomen "doesn't go below an 8." Despite this patient is still very willing to work with OT. Patient needing min G assist and verbal cues to sequence stand pivot to/from bedside commode. Attempts to perform perianal care after bowel movement however limited reach and report of abdominal/back pain. Patient return to recliner and set up for bathing. Patient able to perform upper body with set up assist, max A to complete lower body due to difficulty reaching lower legs, buttock and peri area. May benefit from long handle sponge or hand held shower head at home if she does not have either. HR up to 122 in standing, O2 remain stable on 2L. Will continue to follow to progress home with spouse and [place], anticipate patient will progress well as pain control improves.    Follow Up Recommendations  Home health OT;Supervision - Intermittent    Equipment Recommendations  None recommended by OT       Precautions / Restrictions Precautions Precautions: Fall Precaution Comments: monitor O2/HR       Mobility Bed Mobility               General bed mobility comments: in chair upon arrival    Transfers Overall transfer level: Needs assistance Equipment used: 1 person hand held assist;None Transfers: Sit to/from [facility] Sit to Stand: Min guard Stand pivot transfers: Min guard       General transfer comment: verbal cues for hand placement and sequencing due to multiple lines    Balance Overall balance assessment:  Needs assistance Sitting-balance support: Feet supported Sitting balance-Leahy Scale: Good     Standing balance support: Single extremity supported Standing balance-Leahy Scale: Poor Standing balance comment: holds onto either chair or bedside commode with unilateral extremity to steady during transfer                           ADL either performed or assessed with clinical judgement   ADL Overall ADL's : Needs assistance/impaired     Grooming: Wash/dry face;Set up;Sitting   Upper Body Bathing: Set up;Sitting Upper Body Bathing Details (indicate cue type and reason): patient able to wash her arms, under arms, abdomen sitting in chair (as able with multiple lines/leads) assisted with back only Lower Body Bathing: Maximal assistance;Sitting/lateral leans;Sit to/from stand Lower Body Bathing Details (indicate cue type and reason): patient able to wash upper thighs sitting, needed assist with lower legs. Patient attempted perianal washing however limited reach and report of back pain therefore needing max A for thoroughness in peri area         Toilet Transfer: Min Statistician Details (indicate cue type and reason): cues to sequence as patient has multiple lines, also for hand placement Toileting- Clothing Manipulation and Hygiene: Maximal assistance;Sit to/from stand Toileting - Clothing Manipulation Details (indicate cue type and reason): had patient attempt perianal care after bowel movement, patient minimally able to wash buttock due to abdominal and back pain needing max A to complete     Functional mobility during ADLs: Min guard;Cueing for safety;Cueing  for sequencing General ADL Comments: patient is eager to try and do what she can with OT however is limited by pain      Cognition Arousal/Alertness: Awake/alert Behavior During Therapy: WFL for tasks assessed/performed Overall Cognitive Status: Within Functional Limits for tasks  assessed                                                General Comments HR up to 122 with standing activity likely 2* pain, O2 remained stable on 2L    Pertinent Vitals/ Pain       Pain Assessment: Faces Faces Pain Scale: Hurts whole lot Pain Location: abdomen, lower back Pain Descriptors / Indicators: Moaning;Grimacing Pain Intervention(s): Limited activity within patient's tolerance;Monitored during session         Frequency  Min 2X/week        Progress Toward Goals  OT Goals(current goals can now be found in the care plan section)  Progress towards OT goals: Progressing toward goals  Acute Rehab OT Goals Patient Stated Goal: to have less pain and get back to normal OT Goal Formulation: With patient/family Time For Goal Achievement: 01/26/21 Potential to Achieve Goals: Good ADL Goals Pt Will Perform Grooming: with modified independence;standing Pt Will Perform Upper Body Bathing: with modified independence;sitting Pt Will Perform Lower Body Bathing: with modified independence;sit to/from stand Pt Will Perform Upper Body Dressing: with modified independence;sitting;standing Pt Will Perform Lower Body Dressing: with modified independence;sit to/from stand Pt Will Transfer to Toilet: with modified independence;ambulating;regular height toilet;grab bars Pt Will Perform Toileting - Clothing Manipulation and hygiene: with modified independence;sit to/from stand Pt Will Perform Tub/Shower Transfer: Shower transfer;with modified independence;shower seat;ambulating;rolling walker  Plan Discharge plan needs to be updated       AM-PAC OT "6 Clicks" Daily Activity     Outcome Measure   Help from another person eating meals?: A Little Help from another person taking care of personal grooming?: A Little Help from another person toileting, which includes using toliet, bedpan, or urinal?: A Lot Help from another person bathing (including washing, rinsing,  drying)?: A Lot Help from another person to put on and taking off regular upper body clothing?: A Little Help from another person to put on and taking off regular lower body clothing?: A Lot 6 Click Score: 15    End of Session Equipment Utilized During Treatment: Oxygen  OT Visit Diagnosis: Unsteadiness on feet (R26.81);Pain Pain - part of body:  (abdomen, back)   Activity Tolerance Patient tolerated treatment well;Patient limited by pain   Patient Left in chair;with call bell/phone within reach;with family/visitor present   Nurse Communication Mobility status;Other (comment) (bowel movement)        Time: 1219-7588 OT Time Calculation (min): 32 min  Charges:    $OT Visit: 1 Visit  OT Treatments $Self Care/Home Management : 23-37 mins   Delbert Phenix OT OT pager: [place] 01/14/2021, 1:40 PM

## 2021-01-15 DIAGNOSIS — K858 Other acute pancreatitis without necrosis or infection: Secondary | ICD-10-CM | POA: Diagnosis not present

## 2021-01-15 DIAGNOSIS — R001 Bradycardia, unspecified: Secondary | ICD-10-CM | POA: Diagnosis not present

## 2021-01-15 DIAGNOSIS — F419 Anxiety disorder, unspecified: Secondary | ICD-10-CM | POA: Diagnosis not present

## 2021-01-15 DIAGNOSIS — R338 Other retention of urine: Secondary | ICD-10-CM | POA: Diagnosis not present

## 2021-01-15 LAB — GLUCOSE, CAPILLARY
Glucose-Capillary: 145 mg/dL — ABNORMAL HIGH (ref 70–99)
Glucose-Capillary: 152 mg/dL — ABNORMAL HIGH (ref 70–99)
Glucose-Capillary: 152 mg/dL — ABNORMAL HIGH (ref 70–99)
Glucose-Capillary: 170 mg/dL — ABNORMAL HIGH (ref 70–99)
Glucose-Capillary: 179 mg/dL — ABNORMAL HIGH (ref 70–99)
Glucose-Capillary: 193 mg/dL — ABNORMAL HIGH (ref 70–99)
Glucose-Capillary: 204 mg/dL — ABNORMAL HIGH (ref 70–99)

## 2021-01-15 LAB — BASIC METABOLIC PANEL
Anion gap: 7 (ref 5–15)
BUN: 23 mg/dL — ABNORMAL HIGH (ref 6–20)
CO2: 31 mmol/L (ref 22–32)
Calcium: 7.8 mg/dL — ABNORMAL LOW (ref 8.9–10.3)
Chloride: 101 mmol/L (ref 98–111)
Creatinine, Ser: 0.89 mg/dL (ref 0.44–1.00)
GFR, Estimated: >60 mL/min (ref >60)
Glucose, Bld: 172 mg/dL — ABNORMAL HIGH (ref 70–99)
Potassium: 3 mmol/L — ABNORMAL LOW (ref 3.5–5.1)
Sodium: 139 mmol/L (ref 135–145)

## 2021-01-15 LAB — CBC
HCT: 36.8 % (ref 36.0–46.0)
Hemoglobin: 11.8 g/dL — ABNORMAL LOW (ref 12.0–15.0)
MCH: 28.8 pg (ref 26.0–34.0)
MCHC: 32.1 g/dL (ref 30.0–36.0)
MCV: 89.8 fL (ref 80.0–100.0)
Platelets: 253 10*3/uL (ref 150–400)
RBC: 4.1 MIL/uL (ref 3.87–5.11)
RDW: 14.3 % (ref 11.5–15.5)
WBC: 13.3 10*3/uL — ABNORMAL HIGH (ref 4.0–10.5)
nRBC: 0 % (ref 0.0–0.2)

## 2021-01-15 MED ORDER — FUROSEMIDE 10 MG/ML IJ SOLN
20.0000 mg | Freq: Two times a day (BID) | INTRAMUSCULAR | Status: DC
  Administered 2021-01-15 – 2021-01-16 (×2): 20 mg via INTRAVENOUS
  Filled 2021-01-15 (×2): qty 2

## 2021-01-15 MED ORDER — TRAMADOL HCL 50 MG PO TABS
50.0000 mg | ORAL_TABLET | Freq: Two times a day (BID) | ORAL | Status: AC | PRN
Start: 2021-01-15 — End: 2021-01-15
  Administered 2021-01-15: 50 mg via ORAL
  Filled 2021-01-15: qty 1

## 2021-01-15 MED ORDER — POTASSIUM CHLORIDE 20 MEQ PO PACK
40.0000 meq | PACK | ORAL | Status: AC
  Administered 2021-01-15 (×2): 40 meq via ORAL
  Filled 2021-01-15 (×2): qty 2

## 2021-01-15 NOTE — Progress Notes (Signed)
[facility] Gastroenterology Progress Note  Kimberly Kelly 50 y.o. 25-Mar-1971  CC:  Severe acute post-ERCP pancreatitis  Subjective: Patient states she slept well last night but has had pain this morning, currently 7/10. Denies nausea/vomiting and is tolerating [place] tube feeds and clear fluids.  Had a loose BM this morning.  ROS : Review of Systems  Respiratory:  Negative for cough and shortness of breath.   Gastrointestinal:  Positive for abdominal pain and diarrhea. Negative for blood in stool, constipation, heartburn, melena, nausea and vomiting.    Objective: Vital signs in last 24 hours: Vitals:   01/15/21 0800 01/15/21 0900  BP: (!) 145/88 (!) 131/99  Pulse: (!) 114 (!) 114  Resp: 19 (!) 21  Temp: 97.7 F (36.5 C)   SpO2: 95% 94%    Physical Exam:  General:  Lethargic, acutely ill-appearing, oriented, cooperative  Head:  Normocephalic, without obvious abnormality, atraumatic  Eyes:  Anicteric sclera, EOMs intact  Lungs:   Breathing comfortably on room air  Heart:  Tachycardic   Abdomen:   Mildly firm, mild diffuse tenderness, normoactive bowel sounds  Extremities: Extremities normal, atraumatic, no  edema    Lab Results: Recent Labs    01/14/21 0252 01/14/21 0723 01/14/21 1638 01/15/21 0256  NA  --  143  --  139  K  --  3.0*  --  3.0*  CL  --  107  --  101  CO2  --  30  --  31  GLUCOSE  --  168*  --  172*  BUN  --  24*  --  23*  CREATININE  --  1.02*  --  0.89  CALCIUM  --  8.0*  --  7.8*  MG 1.9  --  1.9  --   PHOS 2.8  --  2.5  --     Recent Labs    01/13/21 0648 01/14/21 0723  AST 25 27  ALT 35 37  ALKPHOS 160* 159*  BILITOT 0.6 0.4  PROT 5.1* 5.0*  ALBUMIN 2.1* 2.0*    Recent Labs    01/14/21 0723 01/15/21 0256  WBC 11.9* 13.3*  HGB 11.1* 11.8*  HCT 34.4* 36.8  MCV 90.3 89.8  PLT 292 253    No results for input(s): LABPROT, INR in the last 72 hours.  Assessment: Severe acute post-ERCP pancreatitis, clinically improving -WBCs  13.3 -Elevated BUN, though down-trending (23) with Cr 0.89  Bilateral pleural effusions  Plan: Continue tube feeds with [place] tube.  Continue clear liquid diet.  Continue supportive care. Continue to trend renal function.  [facility] will follow.  Salley Slaughter PA-C 01/15/2021, 11:22 AM  Contact #  [phone]

## 2021-01-15 NOTE — Progress Notes (Addendum)
[facility] PROGRESS NOTE   Kimberly Kelly OZH:[id number] DOB: [birth year] DOA: 01/07/2021  PCP: [name], FNP  Brief History/Interval Summary: Female with a past medical history of pulmonary nodules, anxiety and depression who underwent ERCP at [facility] on the day she presented to the hospital.  This was done because of right upper quadrant abdominal pain.  History of cholecystectomy in 2018.  Following the ERCP patient developed abdominal pain.  She presented to the ED and was found to have elevated lipase level.  Diagnosed as acute pancreatitis and was hospitalized for further management.  Reason for Visit: Acute pancreatitis, post ERCP  Consultants: Gastroenterology  Procedures: None yet  Antibiotics: Anti-infectives (From admission, onward)    None       Subjective/Interval History: Pt c/o several different abdominal pains.  Mother at bedside asking to increase tube feeding to rate to reach to goal of 50.  Explained that patient is having abdominal pain and May worsen the pain.  We have agreed to increase it to 35 and see how she does.  Overall she feels this edema is improving slowly.  Reports her legs are not as tight as it was.   Assessment/Plan:  Acute pancreatitis, post ERCP This was due to complication of ERCP.  Lipase level was noted to be significantly elevated.  CT findings noted.  Improvement in lipase noted. Gastroenterology continues to follow. Postpyloric feeding tube was placed  6/15On tube feeding at 30 mL/h, will increase to 35 to see if she tolerates adamant about increasing the rate despite that she may be having abdominal pain. Pain management   Acute respiratory failure with hypoxia/worsening pleural effusions bilaterally Patient was noted to be quite tachypneic yesterday morning.  Chest x-ray showed worsening bilateral pleural effusion with some concern for pulmonary edema.  She was given furosemide yesterday with good diuresis of more  than 5 L.  She was on aggressive IV hydration for pancreatitis which is one of the reasons for her dyspnea and fluid overload.   6/15-holding IV fluids  Replacing lites  Start IV Lasix 20 mg IV twice daily and reassess volume status daily  Mobilize as tolerated  Incentive spirometer    Acute kidney injury with concern for urinary retention Foley catheter had to be placed due to retention.  Creatinine peaked at 1.65.   6/15-AKI resolved Continue monitoring since giving gentle diuresis   Normocytic anemia Drop in hemoglobin is likely dilutional. 6/15 no evidence of overt bleeding H&H remained stable   Hypokalemia   Magnesium 1.9. Replating today continue to monitor   Thrombocytosis/leukocytosis Elevated platelet counts were noted at the time of admission.  Likely reactive.  Now normal. Elevated WBC likely due to acute inflammation.  Noted to be afebrile.  History of depression and anxiety Continue Lexapro.  Obesity Estimated body mass index is 35.46 kg/m as calculated from the following:   Height as of this encounter: 5\' 7"  (1.702 m).   Weight as of this encounter: 102.7 kg.   DVT Prophylaxis: Lovenox Code Status: Full code Family Communication: Discussed with patient's mother and patient at bedside.   Status is: Inpatient  Remains inpatient appropriate because:Ongoing active pain requiring inpatient pain management and IV treatments appropriate due to intensity of illness or inability to take PO  Dispo:  Patient From: Home  Planned Disposition: Home  Medically stable for discharge: No      Time spent 45 min with >50% on coc   Medications:  Scheduled:  chlorhexidine  15  mL Mouth Rinse BID   Chlorhexidine Gluconate Cloth  6 each Topical Daily   enoxaparin (LOVENOX) injection  40 mg Subcutaneous Q24H   escitalopram  10 mg Oral Daily   feeding supplement (PROSource TF)  45 mL Per Tube TID   insulin aspart  0-9 Units Subcutaneous Q4H   mouth rinse  15 mL  Mouth Rinse q12n4p   pantoprazole (PROTONIX) IV  40 mg Intravenous Q12H   Continuous:  feeding supplement (OSMOLITE 1.5 CAL) 30 mL/hr at 01/14/21 1724    NOM:VEHM & mag hydroxide-simeth, HYDROmorphone (DILAUDID) injection, metoCLOPramide (REGLAN) injection, ondansetron (ZOFRAN) IV, polyethylene glycol, polyvinyl alcohol, senna   Objective:  Vital Signs  Vitals:   01/15/21 0900 01/15/21 1000 01/15/21 1100 01/15/21 1200  BP: (!) 131/99 140/85 (!) 157/93 132/71  Pulse: (!) 114 (!) 117 (!) 116 (!) 110  Resp: (!) 21 19 20 20   Temp:    (!) 97.4 F (36.3 C)  TempSrc:    Oral  SpO2: 94% 94% 93% 96%  Weight:      Height:        Intake/Output Summary (Last 24 hours) at 01/15/2021 1257 Last data filed at 01/15/2021 1125 Gross per 24 hour  Intake 1691 ml  Output 1300 ml  Net 391 ml   Filed Weights   01/14/21 0500 01/15/21 0356  Weight: 104.8 kg 102.7 kg    NAD, calm, appears tired NG tube in place Decreased breath sounds at bases no wheezing Regular S1-S2 mildly tacky at times no gallops Mildly distended, soft, positive bowel sounds Positive edema bilaterally Awake and alert and oriented, grossly intact Mood and affect appropriate in current setting       Lab Results:  Data Reviewed: I have personally reviewed following labs and imaging studies  CBC: Recent Labs  Lab 01/11/21 0612 01/12/21 0540 01/13/21 0648 01/14/21 0723 01/15/21 0256  WBC 9.5 10.9* 12.0* 11.9* 13.3*  HGB 11.8* 11.0* 11.5* 11.1* 11.8*  HCT 37.4 35.5* 36.6 34.4* 36.8  MCV 93.5 92.7 94.1 90.3 89.8  PLT 317 319 319 292 094    Basic Metabolic Panel: Recent Labs  Lab 01/09/21 0556 01/10/21 0543 01/11/21 0612 01/12/21 0540 01/13/21 0648 01/13/21 1707 01/14/21 0252 01/14/21 0723 01/14/21 1638 01/15/21 0256  NA 143   < > 145 145 144  --   --  143  --  139  K 4.2   < > 3.7 3.8 3.8  --   --  3.0*  --  3.0*  CL 112*   < > 116* 118* 115*  --   --  107  --  101  CO2 24   < > 24 23 24   --    --  30  --  31  GLUCOSE 141*   < > 133* 135* 142*  --   --  168*  --  172*  BUN 15   < > 35* 32* 28*  --   --  24*  --  23*  CREATININE 0.90   < > 1.28* 1.06* 0.92  --   --  1.02*  --  0.89  CALCIUM 7.8*   < > 8.0* 8.1* 8.0*  --   --  8.0*  --  7.8*  MG 1.7  --   --   --  2.2 2.1 1.9  --  1.9  --   PHOS  --   --   --   --  2.0* 2.7 2.8  --  2.5  --    < > =  values in this interval not displayed.    GFR: Estimated Creatinine Clearance: 93.1 mL/min (by C-G formula based on SCr of 0.89 mg/dL).  Liver Function Tests: Recent Labs  Lab 01/10/21 0543 01/11/21 0612 01/12/21 0540 01/13/21 0648 01/14/21 0723  AST 22 15 16 25 27   ALT 33 24 25 35 37  ALKPHOS 84 81 113 160* 159*  BILITOT 1.1 0.8 0.4 0.6 0.4  PROT 5.3* 5.3* 5.2* 5.1* 5.0*  ALBUMIN 2.6* 2.5* 2.2* 2.1* 2.0*    Recent Labs  Lab 01/09/21 0556 01/10/21 0543 01/11/21 0612 01/13/21 0648  LIPASE 673* 293* 72* 24    Recent Results (from the past 240 hour(s))  Resp Panel by RT-PCR (Flu A&B, Covid) Nasopharyngeal Swab     Status: None   Collection Time: 01/08/21  7:49 AM   Specimen: Nasopharyngeal Swab; Nasopharyngeal(NP) swabs in vial transport medium  Result Value Ref Range Status   SARS Coronavirus 2 by RT PCR NEGATIVE NEGATIVE Final    Comment: (NOTE) SARS-CoV-2 target nucleic acids are NOT DETECTED.  The SARS-CoV-2 RNA is generally detectable in upper respiratory specimens during the acute phase of infection. The lowest concentration of SARS-CoV-2 viral copies this assay can detect is 138 copies/mL. A negative result does not preclude SARS-Cov-2 infection and should not be used as the sole basis for treatment or other patient management decisions. A negative result may occur with  improper specimen collection/handling, submission of specimen other than nasopharyngeal swab, presence of viral mutation(s) within the areas targeted by this assay, and inadequate number of viral copies(<138 copies/mL). A negative result  must be combined with clinical observations, patient history, and epidemiological information. The expected result is Negative.  Fact Sheet for Patients:  EntrepreneurPulse.com.au  Fact Sheet for Healthcare Providers:  IncredibleEmployment.be  This test is no t yet approved or cleared by the [place] FDA and  has been authorized for detection and/or diagnosis of SARS-CoV-2 by FDA under an Emergency Use Authorization (EUA). This EUA will remain  in effect (meaning this test can be used) for the duration of the COVID-19 declaration under Section 564(b)(1) of the Act, 21 U.S.C.section 360bbb-3(b)(1), unless the authorization is terminated  or revoked sooner.       Influenza A by PCR NEGATIVE NEGATIVE Final   Influenza B by PCR NEGATIVE NEGATIVE Final    Comment: (NOTE) The Xpert Xpress SARS-CoV-2/FLU/RSV plus assay is intended as an aid in the diagnosis of influenza from Nasopharyngeal swab specimens and should not be used as a sole basis for treatment. Nasal washings and aspirates are unacceptable for Xpert Xpress SARS-CoV-2/FLU/RSV testing.  Fact Sheet for Patients: EntrepreneurPulse.com.au  Fact Sheet for Healthcare Providers: IncredibleEmployment.be  This test is not yet approved or cleared by the [place] FDA and has been authorized for detection and/or diagnosis of SARS-CoV-2 by FDA under an Emergency Use Authorization (EUA). This EUA will remain in effect (meaning this test can be used) for the duration of the COVID-19 declaration under Section 564(b)(1) of the Act, 21 U.S.C. section 360bbb-3(b)(1), unless the authorization is terminated or revoked.  Performed at [facility], [place] [address] [place], [place] [address]   MRSA PCR Screening     Status: None   Collection Time: 01/13/21 10:45 AM   Specimen: Nasal Mucosa; Nasopharyngeal  Result Value Ref Range Status    MRSA by PCR NEGATIVE NEGATIVE Final    Comment:        The GeneXpert MRSA Assay (FDA approved for NASAL specimens only),  is one component of a comprehensive MRSA colonization surveillance program. It is not intended to diagnose MRSA infection nor to guide or monitor treatment for MRSA infections. Performed at [facility], [place] [address] [place], [place] [address]       Radiology Studies: DG Abd 1 View  Result Date: 01/13/2021 CLINICAL DATA:  Check Dobbhoff placement EXAM: ABDOMEN - 1 VIEW COMPARISON:  None. FINDINGS: Weighted feeding catheter is noted in the second portion of the duodenum. Scattered large and small bowel gas is noted. IMPRESSION: Dobbhoff catheter within the second portion of the duodenum. Electronically Signed   By: Inez Catalina M.D.   On: 01/13/2021 15:15        LOS: 7 days   Zaelyn Barbary  [facility] Pager on [email or web address]  01/15/2021, 12:57 PM

## 2021-01-16 DIAGNOSIS — R001 Bradycardia, unspecified: Secondary | ICD-10-CM | POA: Diagnosis not present

## 2021-01-16 DIAGNOSIS — F419 Anxiety disorder, unspecified: Secondary | ICD-10-CM | POA: Diagnosis not present

## 2021-01-16 DIAGNOSIS — K858 Other acute pancreatitis without necrosis or infection: Secondary | ICD-10-CM | POA: Diagnosis not present

## 2021-01-16 DIAGNOSIS — M545 Low back pain, unspecified: Secondary | ICD-10-CM

## 2021-01-16 DIAGNOSIS — R338 Other retention of urine: Secondary | ICD-10-CM | POA: Diagnosis not present

## 2021-01-16 DIAGNOSIS — G8929 Other chronic pain: Secondary | ICD-10-CM

## 2021-01-16 LAB — BASIC METABOLIC PANEL
Anion gap: 6 (ref 5–15)
BUN: 21 mg/dL — ABNORMAL HIGH (ref 6–20)
CO2: 32 mmol/L (ref 22–32)
Calcium: 7.7 mg/dL — ABNORMAL LOW (ref 8.9–10.3)
Chloride: 102 mmol/L (ref 98–111)
Creatinine, Ser: 0.87 mg/dL (ref 0.44–1.00)
GFR, Estimated: >60 mL/min (ref >60)
Glucose, Bld: 197 mg/dL — ABNORMAL HIGH (ref 70–99)
Potassium: 3.2 mmol/L — ABNORMAL LOW (ref 3.5–5.1)
Sodium: 140 mmol/L (ref 135–145)

## 2021-01-16 LAB — CBC
HCT: 33.7 % — ABNORMAL LOW (ref 36.0–46.0)
Hemoglobin: 10.9 g/dL — ABNORMAL LOW (ref 12.0–15.0)
MCH: 29.2 pg (ref 26.0–34.0)
MCHC: 32.3 g/dL (ref 30.0–36.0)
MCV: 90.3 fL (ref 80.0–100.0)
Platelets: 195 10*3/uL (ref 150–400)
RBC: 3.73 MIL/uL — ABNORMAL LOW (ref 3.87–5.11)
RDW: 14.2 % (ref 11.5–15.5)
WBC: 12.8 10*3/uL — ABNORMAL HIGH (ref 4.0–10.5)
nRBC: 0 % (ref 0.0–0.2)

## 2021-01-16 LAB — GLUCOSE, CAPILLARY
Glucose-Capillary: 179 mg/dL — ABNORMAL HIGH (ref 70–99)
Glucose-Capillary: 182 mg/dL — ABNORMAL HIGH (ref 70–99)
Glucose-Capillary: 192 mg/dL — ABNORMAL HIGH (ref 70–99)
Glucose-Capillary: 201 mg/dL — ABNORMAL HIGH (ref 70–99)
Glucose-Capillary: 211 mg/dL — ABNORMAL HIGH (ref 70–99)
Glucose-Capillary: 222 mg/dL — ABNORMAL HIGH (ref 70–99)

## 2021-01-16 LAB — HEMOGLOBIN A1C
Hgb A1c MFr Bld: 5.3 % (ref 4.8–5.6)
Mean Plasma Glucose: 105 mg/dL

## 2021-01-16 MED ORDER — POTASSIUM CHLORIDE CRYS ER 20 MEQ PO TBCR
40.0000 meq | EXTENDED_RELEASE_TABLET | Freq: Every day | ORAL | Status: DC
  Administered 2021-01-17: 40 meq via ORAL
  Filled 2021-01-16: qty 2

## 2021-01-16 MED ORDER — POTASSIUM CHLORIDE CRYS ER 20 MEQ PO TBCR: 40.0000 meq | EXTENDED_RELEASE_TABLET | Freq: Two times a day (BID) | ORAL | Status: DC

## 2021-01-16 MED ORDER — FUROSEMIDE 10 MG/ML IJ SOLN: 20.0000 mg | Freq: Every day | INTRAMUSCULAR | Status: DC

## 2021-01-16 MED ORDER — POTASSIUM CHLORIDE CRYS ER 20 MEQ PO TBCR
40.0000 meq | EXTENDED_RELEASE_TABLET | Freq: Two times a day (BID) | ORAL | Status: DC
  Administered 2021-01-16: 40 meq via ORAL
  Filled 2021-01-16: qty 2

## 2021-01-16 MED ORDER — LIDOCAINE 5 % EX PTCH
1.0000 | MEDICATED_PATCH | CUTANEOUS | Status: DC
  Administered 2021-01-16 – 2021-01-17 (×2): 1 via TRANSDERMAL
  Filled 2021-01-16 (×7): qty 1

## 2021-01-16 MED ORDER — FUROSEMIDE 10 MG/ML IJ SOLN: 40.0000 mg | Freq: Two times a day (BID) | INTRAMUSCULAR | Status: DC

## 2021-01-16 MED ORDER — FUROSEMIDE 10 MG/ML IJ SOLN: 20.0000 mg | Freq: Two times a day (BID) | INTRAMUSCULAR | Status: DC

## 2021-01-16 MED ORDER — POTASSIUM CHLORIDE CRYS ER 20 MEQ PO TBCR: 40.0000 meq | EXTENDED_RELEASE_TABLET | Freq: Once | ORAL | Status: DC

## 2021-01-16 NOTE — Progress Notes (Signed)
[facility] PROGRESS NOTE   Kimberly Kelly GLO:[id number] DOB: 1970-09-22 DOA: 01/07/2021  PCP: [name], FNP  Brief History/Interval Summary: Female with a past medical history of pulmonary nodules, anxiety and depression who underwent ERCP at [facility] on the day she presented to the hospital.  This was done because of right upper quadrant abdominal pain.  History of cholecystectomy in 2018.  Following the ERCP Kimberly Kelly developed abdominal pain.  She presented to the ED and was found to have elevated lipase level.  Diagnosed as acute pancreatitis and was hospitalized for further management.  6/16- TF increased to 45cc , and pt tolerating it  Reason for Visit: Acute pancreatitis, post ERCP  Consultants: Gastroenterology  Procedures: None yet  Antibiotics: Anti-infectives (From admission, onward)    None       Subjective/Interval History: Feels her edema is improving.  Little less short of breath.  Still has swelling.  Try to ambulate.  Tube feeding increased to 45 cc/h but appears to be tolerating, but still with abdominal discomfort.  Also complaining of lower back pain which is chronic for her and she takes Robaxin at home.  Had loose stools still.  Assessment/Plan:  Acute pancreatitis, post ERCP This was due to complication of ERCP.  Lipase level was noted to be significantly elevated.  CT findings noted.  Improvement in lipase noted. Gastroenterology continues to follow. Postpyloric feeding tube was placed  6/16 tube feeding increased to 45 cc/h appears to be tolerating it, plan to increase slowly to reach goal.   Still with abdominal pain  Pain mx.     Acute respiratory failure with hypoxia/worsening pleural effusions bilaterally Kimberly Kelly was noted to be quite tachypneic yesterday morning.  Chest x-ray showed worsening bilateral pleural effusion with some concern for pulmonary edema.  She was given furosemide yesterday with good diuresis of more than 5 L.   She was on aggressive IV hydration for pancreatitis which is one of the reasons for her dyspnea and fluid overload.   Holding ivf 6/16-she is also third spacing  Will decrease Lasix from 20 mg IV twice daily to 20 mg IV daily  Replace electrolytes Mobilize as tolerated I-S Check BNP   Acute kidney injury with concern for urinary retention Foley catheter had to be placed due to retention.  Creatinine peaked at 1.65.   6/16 creatinine appears to remain stable AKI is resolved Continue to monitor while on diuretics    Normocytic anemia Drop in hemoglobin is likely dilutional. 6/16 no signs or symptoms of bleeding H&H remained stable Continue to monitor  Chronic Back pain- Takes tramadol and Robaxin Discussed trying lidocaine patch for now since Robaxin may cause drowsiness and she is going to give this a try We will start her on lidocaine patch  Hypokalemia   Magnesium 1.9. Replacing and monitor   Thrombocytosis/leukocytosis Elevated platelet counts were noted at the time of admission.  Likely reactive.  Now normal. Elevated WBC likely due to acute inflammation.  Noted to be afebrile.  History of depression and anxiety Continue Lexapro  Obesity Estimated body mass index is 35.6 kg/m as calculated from the following:   Height as of this encounter: 5\' 7"  (1.702 m).   Weight as of this encounter: 103.1 kg.   DVT Prophylaxis: Lovenox Code Status: Full code Family Communication: Discussed with Kimberly Kelly's mother and Kimberly Kelly at bedside.   Status is: Inpatient  Remains inpatient appropriate because:Ongoing active pain requiring inpatient pain management and IV treatments appropriate due to  intensity of illness or inability to take PO  Dispo:  Kimberly Kelly From: Home  Planned Disposition: Home  Medically stable for discharge: No         Medications:  Scheduled:  chlorhexidine  15 mL Mouth Rinse BID   Chlorhexidine Gluconate Cloth  6 each Topical Daily   enoxaparin  (LOVENOX) injection  40 mg Subcutaneous Q24H   escitalopram  10 mg Oral Daily   feeding supplement (PROSource TF)  45 mL Per Tube TID   furosemide  40 mg Intravenous BID   insulin aspart  0-9 Units Subcutaneous Q4H   lidocaine  1 patch Transdermal Q24H   mouth rinse  15 mL Mouth Rinse q12n4p   pantoprazole (PROTONIX) IV  40 mg Intravenous Q12H   potassium chloride  40 mEq Oral BID   Continuous:  feeding supplement (OSMOLITE 1.5 CAL) 55 mL/hr at 01/16/21 1030    ION:GEXB & mag hydroxide-simeth, HYDROmorphone (DILAUDID) injection, metoCLOPramide (REGLAN) injection, ondansetron (ZOFRAN) IV, polyvinyl alcohol, senna   Objective:  Vital Signs  Vitals:   01/16/21 0800 01/16/21 0900 01/16/21 1100 01/16/21 1154  BP: (!) 143/97 (!) 146/86 139/82   Pulse: (!) 120 (!) 117 (!) 107   Resp: (!) 28 (!) 25 (!) 22   Temp:    97.7 F (36.5 C)  TempSrc:    Oral  SpO2: 95% 96% 98%   Weight:      Height:        Intake/Output Summary (Last 24 hours) at 01/16/2021 1219 Last data filed at 01/16/2021 0700 Gross per 24 hour  Intake 682.5 ml  Output 1550 ml  Net -867.5 ml   Filed Weights   01/14/21 0500 01/15/21 0356 01/16/21 0452  Weight: 104.8 kg 102.7 kg 103.1 kg   NAD, sitting in chair awake mother at bedside Decreased breath sounds at bases, no wheezing Regular S1-S2 mildly tacky no gallops Distended, positive bowel sounds, diffusely tender Extremities x4 with edema, mildly less improved yesterday Awake and alert, oriented x4 grossly intact Mood and affect appropriate in current setting        Lab Results:  Data Reviewed: I have personally reviewed following labs and imaging studies  CBC: Recent Labs  Lab 01/12/21 0540 01/13/21 0648 01/14/21 0723 01/15/21 0256 01/16/21 0253  WBC 10.9* 12.0* 11.9* 13.3* 12.8*  HGB 11.0* 11.5* 11.1* 11.8* 10.9*  HCT 35.5* 36.6 34.4* 36.8 33.7*  MCV 92.7 94.1 90.3 89.8 90.3  PLT 319 319 292 253 284    Basic Metabolic Panel: Recent  Labs  Lab 01/12/21 0540 01/13/21 0648 01/13/21 1707 01/14/21 0252 01/14/21 0723 01/14/21 1638 01/15/21 0256 01/16/21 0253  NA 145 144  --   --  143  --  139 140  K 3.8 3.8  --   --  3.0*  --  3.0* 3.2*  CL 118* 115*  --   --  107  --  101 102  CO2 23 24  --   --  30  --  31 32  GLUCOSE 135* 142*  --   --  168*  --  172* 197*  BUN 32* 28*  --   --  24*  --  23* 21*  CREATININE 1.06* 0.92  --   --  1.02*  --  0.89 0.87  CALCIUM 8.1* 8.0*  --   --  8.0*  --  7.8* 7.7*  MG  --  2.2 2.1 1.9  --  1.9  --   --   PHOS  --  2.0* 2.7 2.8  --  2.5  --   --     GFR: Estimated Creatinine Clearance: 95.5 mL/min (by C-G formula based on SCr of 0.87 mg/dL).  Liver Function Tests: Recent Labs  Lab 01/10/21 0543 01/11/21 0612 01/12/21 0540 01/13/21 0648 01/14/21 0723  AST 22 15 16 25 27   ALT 33 24 25 35 37  ALKPHOS 84 81 113 160* 159*  BILITOT 1.1 0.8 0.4 0.6 0.4  PROT 5.3* 5.3* 5.2* 5.1* 5.0*  ALBUMIN 2.6* 2.5* 2.2* 2.1* 2.0*    Recent Labs  Lab 01/10/21 0543 01/11/21 0612 01/13/21 0648  LIPASE 293* 72* 24    Recent Results (from the past 240 hour(s))  Resp Panel by RT-PCR (Flu A&B, Covid) Nasopharyngeal Swab     Status: None   Collection Time: 01/08/21  7:49 AM   Specimen: Nasopharyngeal Swab; Nasopharyngeal(NP) swabs in vial transport medium  Result Value Ref Range Status   SARS Coronavirus 2 by RT PCR NEGATIVE NEGATIVE Final    Comment: (NOTE) SARS-CoV-2 target nucleic acids are NOT DETECTED.  The SARS-CoV-2 RNA is generally detectable in upper respiratory specimens during the acute phase of infection. The lowest concentration of SARS-CoV-2 viral copies this assay can detect is 138 copies/mL. A negative result does not preclude SARS-Cov-2 infection and should not be used as the sole basis for treatment or other Kimberly Kelly management decisions. A negative result may occur with  improper specimen collection/handling, submission of specimen other than nasopharyngeal swab,  presence of viral mutation(s) within the areas targeted by this assay, and inadequate number of viral copies(<138 copies/mL). A negative result must be combined with clinical observations, Kimberly Kelly history, and epidemiological information. The expected result is Negative.  Fact Sheet for Patients:  EntrepreneurPulse.com.au  Fact Sheet for Healthcare Providers:  IncredibleEmployment.be  This test is no t yet approved or cleared by the [place] FDA and  has been authorized for detection and/or diagnosis of SARS-CoV-2 by FDA under an Emergency Use Authorization (EUA). This EUA will remain  in effect (meaning this test can be used) for the duration of the COVID-19 declaration under Section 564(b)(1) of the Act, 21 U.S.C.section 360bbb-3(b)(1), unless the authorization is terminated  or revoked sooner.       Influenza A by PCR NEGATIVE NEGATIVE Final   Influenza B by PCR NEGATIVE NEGATIVE Final    Comment: (NOTE) The Xpert Xpress SARS-CoV-2/FLU/RSV plus assay is intended as an aid in the diagnosis of influenza from Nasopharyngeal swab specimens and should not be used as a sole basis for treatment. Nasal washings and aspirates are unacceptable for Xpert Xpress SARS-CoV-2/FLU/RSV testing.  Fact Sheet for Patients: EntrepreneurPulse.com.au  Fact Sheet for Healthcare Providers: IncredibleEmployment.be  This test is not yet approved or cleared by the [place] FDA and has been authorized for detection and/or diagnosis of SARS-CoV-2 by FDA under an Emergency Use Authorization (EUA). This EUA will remain in effect (meaning this test can be used) for the duration of the COVID-19 declaration under Section 564(b)(1) of the Act, 21 U.S.C. section 360bbb-3(b)(1), unless the authorization is terminated or revoked.  Performed at [facility], [place] [address] [place], [place] [address]   MRSA  PCR Screening     Status: None   Collection Time: 01/13/21 10:45 AM   Specimen: Nasal Mucosa; Nasopharyngeal  Result Value Ref Range Status   MRSA by PCR NEGATIVE NEGATIVE Final    Comment:        The GeneXpert MRSA Assay (FDA approved  for NASAL specimens only), is one component of a comprehensive MRSA colonization surveillance program. It is not intended to diagnose MRSA infection nor to guide or monitor treatment for MRSA infections. Performed at [facility], [place] [address], [place], [place] [address]       Radiology Studies: No results found.   Time spent more than 45 minutes with more than 50% on COC   LOS: 8 days   Kimberly Kelly  [facility] Pager on [email or web address]  01/16/2021, 12:19 PM

## 2021-01-16 NOTE — TOC Initial Note (Signed)
Transition of Care [facility]) - Initial/Assessment Note    Patient Details  Name: Kimberly Kelly MRN: [id number] Date of Birth: [birth year]  Transition of Care [facility]) CM/SW Contact:    Leeroy Cha, RN Phone Number: 01/16/2021, 8:37 AM  Clinical Narrative:                 Female with a past medical history of pulmonary nodules, anxiety and depression who underwent ERCP at [facility] on the day she presented to the hospital.  This was done because of right upper quadrant abdominal pain.  History of cholecystectomy in 2018.  Following the ERCP patient developed abdominal pain.  She presented to the ED and was found to have elevated lipase level.  Diagnosed as acute pancreatitis and was hospitalized for further management.   Reason for Visit: Acute pancreatitis, post ERCP   Consultants: Gastroenterology   Procedures: None yet   Antibiotics: Anti-infectives (From admission, onward)    PLAN: to return home with husband.  Expected Discharge Plan: Home/Self Care Barriers to Discharge: Continued Medical Work up   Patient Goals and CMS Choice Patient states their goals for this hospitalization and ongoing recovery are:: to go back home CMS Medicare.gov Compare Post Acute Care list provided to:: Patient    Expected Discharge Plan and Services Expected Discharge Plan: Home/Self Care   Discharge Planning Services: CM Consult   Living arrangements for the past 2 months: Single Family Home                                      Prior Living Arrangements/Services Living arrangements for the past 2 months: Single Family Home Lives with:: Spouse Patient language and need for interpreter reviewed:: Yes Do you feel safe going back to the place where you live?: Yes            Criminal Activity/Legal Involvement Pertinent to Current Situation/Hospitalization: No - Comment as needed  Activities of Daily Living Home Assistive Devices/Equipment: None ADL Screening (condition at  time of admission) Patient's cognitive ability adequate to safely complete daily activities?: Yes Is the patient deaf or have difficulty hearing?: No Does the patient have difficulty seeing, even when wearing glasses/contacts?: No Does the patient have difficulty concentrating, remembering, or making decisions?: No Patient able to express need for assistance with ADLs?: Yes Does the patient have difficulty dressing or bathing?: No Independently performs ADLs?: Yes (appropriate for developmental age) Does the patient have difficulty walking or climbing stairs?: No Weakness of Legs: None Weakness of Arms/Hands: None  Permission Sought/Granted                  Emotional Assessment Appearance:: Appears stated age Attitude/Demeanor/Rapport: Engaged Affect (typically observed): Calm Orientation: : Oriented to Self, Oriented to Place, Oriented to  Time, Oriented to Situation Alcohol / Substance Use: Not Applicable Psych Involvement: No (comment)  Admission diagnosis:  Acute pancreatitis [K85.90] Idiopathic acute pancreatitis without infection or necrosis [K85.00] Patient Active Problem List   Diagnosis Date Noted   Hypokalemia 01/08/2021   Thrombocytosis 01/08/2021   Leukocytosis 01/08/2021   Bradycardia 01/08/2021   Depression 01/08/2021   Anxiety 01/08/2021   Acute pancreatitis 01/07/2021   S/P ERCP 01/07/2021   Acute urinary retention 01/07/2021   Lung nodule 12/27/2020   Symptomatic cholelithiasis 10/10/2016   PCP:  [name], FNP Pharmacy:   [facility] [id number] - [place], [address] [address] Phone: [phone] Fax: [phone]     Social Determinants of Health (SDOH) Interventions    Readmission Risk Interventions No flowsheet data found.

## 2021-01-16 NOTE — Progress Notes (Signed)
[facility] Gastroenterology Progress Note  Kimberly Kelly 50 y.o. 1970-09-22  CC:  Severe acute post-ERCP pancreatitis  Subjective: Patient states abdominal pain is improving, though she has some low back pain today.  Currently abdominal pain is 5/10.  Denies nausea/vomiting and is tolerating NJ tube feeds at 45cc/h, as well as clear fluids.  Continues to have a few loose/liquid BMs/day.  ROS : Review of Systems  Respiratory:  Negative for cough and shortness of breath.   Gastrointestinal:  Positive for abdominal pain and diarrhea. Negative for blood in stool, constipation, heartburn, melena, nausea and vomiting.  Musculoskeletal:  Positive for back pain.    Objective: Vital signs in last 24 hours: Vitals:   01/16/21 0800 01/16/21 0900  BP: (!) 143/97 (!) 146/86  Pulse: (!) 120 (!) 117  Resp: (!) 28 (!) 25  Temp:    SpO2: 95% 96%    Physical Exam:  General:  Lethargic, acutely ill-appearing, oriented, cooperative  Head:  Normocephalic, without obvious abnormality, atraumatic  Eyes:  Anicteric sclera, EOMs intact  Lungs:   Breathing comfortably on room air  Heart:  Tachycardic   Abdomen:   Mildly firm, mild diffuse tenderness, normoactive bowel sounds  Extremities: Extremities normal, atraumatic, no  edema    Lab Results: Recent Labs    01/14/21 0252 01/14/21 0723 01/14/21 1638 01/15/21 0256 01/16/21 0253  NA  --    < >  --  139 140  K  --    < >  --  3.0* 3.2*  CL  --    < >  --  101 102  CO2  --    < >  --  31 32  GLUCOSE  --    < >  --  172* 197*  BUN  --    < >  --  23* 21*  CREATININE  --    < >  --  0.89 0.87  CALCIUM  --    < >  --  7.8* 7.7*  MG 1.9  --  1.9  --   --   PHOS 2.8  --  2.5  --   --    < > = values in this interval not displayed.    Recent Labs    01/14/21 0723  AST 27  ALT 37  ALKPHOS 159*  BILITOT 0.4  PROT 5.0*  ALBUMIN 2.0*    Recent Labs    01/15/21 0256 01/16/21 0253  WBC 13.3* 12.8*  HGB 11.8* 10.9*  HCT 36.8 33.7*  MCV  89.8 90.3  PLT 253 195    No results for input(s): LABPROT, INR in the last 72 hours.  Assessment: Severe acute post-ERCP pancreatitis, clinically improving -WBCs 12.8 -Elevated BUN, though down-trending (21) with Cr 0.87  Bilateral pleural effusions  Plan: Continue tube feeds with NJ tube.  Continue clear liquid diet.  Continue supportive care. Continue to trend renal function.  [facility] will follow.  Salley Slaughter PA-C 01/16/2021, 11:03 AM  Contact #  [phone]

## 2021-01-16 NOTE — Progress Notes (Signed)
Physical Therapy Treatment Patient Details Name: Kimberly Kelly MRN: [id number] DOB: [birth year] Today's Date: 01/16/2021    History of Present Illness This 50 y.o. female admitted s/p ERCP with acute pancratitis.   PMH includes: anxiety, lung nodules, diverticulosis, s/p cholectystectomy, frozen shoulder Lt.    PT Comments    Pt tolerated increased ambulation distance of 120' with RW, SaO2 92% on 2L O2 [place], HR 130 walking, no loss of balance. Distance limited by fatigue and 2/4 dyspnea. Pt is progressing with mobility.    Follow Up Recommendations  Home health PT     Equipment Recommendations  Rolling walker with 5" wheels    Recommendations for Other Services       Precautions / Restrictions Precautions Precautions: Fall Precaution Comments: monitor O2/HR; NG tube; Restrictions Weight Bearing Restrictions: No    Mobility  Bed Mobility               General bed mobility comments: in chair upon arrival    Transfers Overall transfer level: Needs assistance Equipment used: Rolling walker (2 wheeled) Transfers: Sit to/from Stand Sit to Stand: Supervision         General transfer comment: VCs hand placement  Ambulation/Gait Ambulation/Gait assistance: Min guard Gait Distance (Feet): 120 Feet Assistive device: Rolling walker (2 wheeled) Gait Pattern/deviations: Step-through pattern;Decreased stride length Gait velocity: decr   General Gait Details: SaO2 92% on 2L O2, HR 130 with walking, 2/4 dyspnea, no loss of balance, distance limited by fatigue   Stairs             Wheelchair Mobility    Modified Rankin (Stroke Patients Only)       Balance Overall balance assessment: Needs assistance   Sitting balance-Leahy Scale: Good     Standing balance support: Bilateral upper extremity supported Standing balance-Leahy Scale: Poor                              Cognition Arousal/Alertness: Awake/alert Behavior During Therapy: WFL for  tasks assessed/performed Overall Cognitive Status: Within Functional Limits for tasks assessed                                        Exercises General Exercises - Lower Extremity Ankle Circles/Pumps: AROM;Both;10 reps;Supine    General Comments        Pertinent Vitals/Pain Pain Score: 8  Pain Location: low back Pain Descriptors / Indicators: Aching Pain Intervention(s): Limited activity within patient's tolerance;Monitored during session;Premedicated before session;Ice applied    Home Living                      Prior Function            PT Goals (current goals can now be found in the care plan section) Acute Rehab PT Goals Patient Stated Goal: to have less pain and get back to normal PT Goal Formulation: With patient/family Time For Goal Achievement: 01/28/21 Potential to Achieve Goals: Good Progress towards PT goals: Progressing toward goals    Frequency    Min 3X/week      PT Plan Current plan remains appropriate    Co-evaluation              AM-PAC PT "6 Clicks" Mobility   Outcome Measure  Help needed turning from your back to your side while in a  flat bed without using bedrails?: A Little Help needed moving from lying on your back to sitting on the side of a flat bed without using bedrails?: A Little Help needed moving to and from a bed to a chair (including a wheelchair)?: A Little Help needed standing up from a chair using your arms (e.g., wheelchair or bedside chair)?: A Little Help needed to walk in hospital room?: A Little Help needed climbing 3-5 steps with a railing? : A Little 6 Click Score: 18    End of Session Equipment Utilized During Treatment: Oxygen Activity Tolerance: Patient limited by fatigue;Patient limited by pain Patient left: in chair;with call bell/phone within reach;with family/visitor present Nurse Communication: Mobility status PT Visit Diagnosis: Muscle weakness (generalized) (M62.81);Pain      Time: 1340-1407 PT Time Calculation (min) (ACUTE ONLY): 27 min  Charges:  $Gait Training: 8-22 mins $Therapeutic Activity: 8-22 mins                     Blondell Reveal Kistler PT 01/16/2021  Acute Rehabilitation Services Pager [phone] Office [phone]

## 2021-01-17 ENCOUNTER — Inpatient Hospital Stay (HOSPITAL_COMMUNITY): Payer: BC Managed Care – PPO

## 2021-01-17 DIAGNOSIS — K9189 Other postprocedural complications and disorders of digestive system: Secondary | ICD-10-CM | POA: Diagnosis not present

## 2021-01-17 DIAGNOSIS — K858 Other acute pancreatitis without necrosis or infection: Secondary | ICD-10-CM | POA: Diagnosis not present

## 2021-01-17 DIAGNOSIS — E669 Obesity, unspecified: Secondary | ICD-10-CM

## 2021-01-17 DIAGNOSIS — N179 Acute kidney failure, unspecified: Secondary | ICD-10-CM

## 2021-01-17 DIAGNOSIS — M545 Low back pain, unspecified: Secondary | ICD-10-CM | POA: Diagnosis not present

## 2021-01-17 DIAGNOSIS — K859 Acute pancreatitis without necrosis or infection, unspecified: Secondary | ICD-10-CM

## 2021-01-17 DIAGNOSIS — R000 Tachycardia, unspecified: Secondary | ICD-10-CM

## 2021-01-17 LAB — BASIC METABOLIC PANEL
Anion gap: 9 (ref 5–15)
BUN: 20 mg/dL (ref 6–20)
CO2: 33 mmol/L — ABNORMAL HIGH (ref 22–32)
Calcium: 7.9 mg/dL — ABNORMAL LOW (ref 8.9–10.3)
Chloride: 97 mmol/L — ABNORMAL LOW (ref 98–111)
Creatinine, Ser: 0.84 mg/dL (ref 0.44–1.00)
GFR, Estimated: >60 mL/min (ref >60)
Glucose, Bld: 218 mg/dL — ABNORMAL HIGH (ref 70–99)
Potassium: 3.8 mmol/L (ref 3.5–5.1)
Sodium: 139 mmol/L (ref 135–145)

## 2021-01-17 LAB — CBC
HCT: 34.7 % — ABNORMAL LOW (ref 36.0–46.0)
Hemoglobin: 11 g/dL — ABNORMAL LOW (ref 12.0–15.0)
MCH: 29 pg (ref 26.0–34.0)
MCHC: 31.7 g/dL (ref 30.0–36.0)
MCV: 91.6 fL (ref 80.0–100.0)
Platelets: 196 10*3/uL (ref 150–400)
RBC: 3.79 MIL/uL — ABNORMAL LOW (ref 3.87–5.11)
RDW: 14.3 % (ref 11.5–15.5)
WBC: 13.3 10*3/uL — ABNORMAL HIGH (ref 4.0–10.5)
nRBC: 0 % (ref 0.0–0.2)

## 2021-01-17 LAB — GLUCOSE, CAPILLARY
Glucose-Capillary: 201 mg/dL — ABNORMAL HIGH (ref 70–99)
Glucose-Capillary: 231 mg/dL — ABNORMAL HIGH (ref 70–99)
Glucose-Capillary: 151 mg/dL — ABNORMAL HIGH (ref 70–99)
Glucose-Capillary: 157 mg/dL — ABNORMAL HIGH (ref 70–99)
Glucose-Capillary: 137 mg/dL — ABNORMAL HIGH (ref 70–99)
Glucose-Capillary: 160 mg/dL — ABNORMAL HIGH (ref 70–99)

## 2021-01-17 LAB — BRAIN NATRIURETIC PEPTIDE: B Natriuretic Peptide: 83.7 pg/mL (ref 0.0–100.0)

## 2021-01-17 LAB — ALBUMIN: Albumin: 1.9 g/dL — ABNORMAL LOW (ref 3.5–5.0)

## 2021-01-17 MED ORDER — FENTANYL CITRATE (PF) 100 MCG/2ML IJ SOLN
50.0000 ug | INTRAMUSCULAR | Status: DC | PRN
  Administered 2021-01-17 – 2021-01-18 (×4): 50 ug via INTRAVENOUS
  Filled 2021-01-17 (×4): qty 2

## 2021-01-17 MED ORDER — SODIUM BICARBONATE 650 MG PO TABS
650.0000 mg | ORAL_TABLET | Freq: Once | ORAL | Status: AC
Start: 2021-01-17 — End: 2021-01-17
  Administered 2021-01-17: 650 mg
  Filled 2021-01-17: qty 1

## 2021-01-17 MED ORDER — INSULIN ASPART 100 UNIT/ML IJ SOLN: 2.0000 [IU] | INTRAMUSCULAR | Status: DC

## 2021-01-17 MED ORDER — METHOCARBAMOL 500 MG PO TABS
750.0000 mg | ORAL_TABLET | Freq: Three times a day (TID) | ORAL | Status: DC | PRN
  Administered 2021-01-17 – 2021-01-18 (×2): 750 mg via ORAL
  Filled 2021-01-17 (×2): qty 2

## 2021-01-17 MED ORDER — SIMETHICONE 80 MG PO CHEW
80.0000 mg | CHEWABLE_TABLET | Freq: Four times a day (QID) | ORAL | Status: DC | PRN
  Administered 2021-01-17 – 2021-01-21 (×8): 80 mg via ORAL
  Filled 2021-01-17 (×8): qty 1

## 2021-01-17 MED ORDER — PANCRELIPASE (LIP-PROT-AMYL) 10440-39150 UNITS PO TABS
20880.0000 [IU] | ORAL_TABLET | Freq: Once | ORAL | Status: AC
  Administered 2021-01-17: 20880 [IU]
  Filled 2021-01-17: qty 2

## 2021-01-17 MED ORDER — INSULIN ASPART 100 UNIT/ML IJ SOLN
0.0000 [IU] | INTRAMUSCULAR | Status: DC
  Administered 2021-01-17: 5 [IU] via SUBCUTANEOUS

## 2021-01-17 MED ORDER — FENTANYL CITRATE (PF) 100 MCG/2ML IJ SOLN
25.0000 ug | INTRAMUSCULAR | Status: DC | PRN
  Administered 2021-01-18 – 2021-01-19 (×2): 25 ug via INTRAVENOUS
  Filled 2021-01-17 (×2): qty 2

## 2021-01-17 MED ORDER — INSULIN ASPART 100 UNIT/ML IJ SOLN
0.0000 [IU] | INTRAMUSCULAR | Status: DC
Start: 2021-01-17 — End: 2021-01-22
  Administered 2021-01-17: 3 [IU] via SUBCUTANEOUS
  Administered 2021-01-17: 2 [IU] via SUBCUTANEOUS
  Administered 2021-01-17 – 2021-01-18 (×2): 3 [IU] via SUBCUTANEOUS
  Administered 2021-01-18: 2 [IU] via SUBCUTANEOUS
  Administered 2021-01-18 (×3): 3 [IU] via SUBCUTANEOUS
  Administered 2021-01-18 – 2021-01-19 (×3): 2 [IU] via SUBCUTANEOUS
  Administered 2021-01-19: 5 [IU] via SUBCUTANEOUS
  Administered 2021-01-19: 3 [IU] via SUBCUTANEOUS
  Administered 2021-01-19: 2 [IU] via SUBCUTANEOUS
  Administered 2021-01-19 – 2021-01-20 (×7): 3 [IU] via SUBCUTANEOUS
  Administered 2021-01-21 (×2): 2 [IU] via SUBCUTANEOUS
  Administered 2021-01-21: 3 [IU] via SUBCUTANEOUS
  Administered 2021-01-21 – 2021-01-22 (×3): 2 [IU] via SUBCUTANEOUS

## 2021-01-17 NOTE — Progress Notes (Signed)
Chaplain was rounding when she encountered pt's mother in hallway.  Chaplain offered support.  Pt's mother said her daughter was having a procedure and did not wish to have anyone in the room. Chaplain offered to be available to them if needed. Rev. Tamsen Snider Pager [phone]

## 2021-01-17 NOTE — Progress Notes (Addendum)
Physical Therapy Treatment Patient Details Name: Kimberly Kelly MRN: [id number] DOB: July 04, 1971 Today's Date: 01/17/2021    History of Present Illness This 50 y.o. female admitted s/p ERCP with acute pancratitis.   PMH includes: anxiety, lung nodules, diverticulosis, s/p cholectystectomy, frozen shoulder Lt.    PT Comments    Patient reports having a difficult day but motivated to ambulate." I need to." Patient ambulated x 110' using RW on RA. SPO2 91 %, HR  max 133 while ambulating. Patient continues to report back pain. Provided ice pack. Patient reports right foot feels numbish, has edema in the foot.  Continue PT for mobility.  Follow Up Recommendations  Home health PT     Equipment Recommendations  Rolling walker with 5" wheels    Recommendations for Other Services       Precautions / Restrictions Precautions Precautions: Fall Precaution Comments: monitor O2/HR; NG tube;    Mobility  Bed Mobility   Bed Mobility: Supine to Sit     Supine to sit: Min guard          Transfers Overall transfer level: Needs assistance Equipment used: Rolling walker (2 wheeled)   Sit to Stand: Supervision;From elevated surface Stand pivot transfers: Min guard          Ambulation/Gait Ambulation/Gait assistance: Min guard Gait Distance (Feet): 110 Feet Assistive device: Rolling walker (2 wheeled) Gait Pattern/deviations: Step-through pattern;Decreased stride length Gait velocity: decr   General Gait Details: on RA, SPO2 91, HR max 133   Stairs             Wheelchair Mobility    Modified Rankin (Stroke Patients Only)       Balance Overall balance assessment: Needs assistance Sitting-balance support: Feet supported Sitting balance-Leahy Scale: Good     Standing balance support: Bilateral upper extremity supported Standing balance-Leahy Scale: Poor Standing balance comment: reliant on RW                            Cognition  Arousal/Alertness: Awake/alert Behavior During Therapy: WFL for tasks assessed/performed Overall Cognitive Status: Within Functional Limits for tasks assessed                                        Exercises      General Comments        Pertinent Vitals/Pain Pain Score: 8  Pain Location: low back Pain Descriptors / Indicators: Aching Pain Intervention(s): Ice applied;Monitored during session;Premedicated before session    Home Living                      Prior Function            PT Goals (current goals can now be found in the care plan section) Progress towards PT goals: Progressing toward goals    Frequency    Min 3X/week      PT Plan Current plan remains appropriate    Co-evaluation              AM-PAC PT "6 Clicks" Mobility   Outcome Measure  Help needed turning from your back to your side while in a flat bed without using bedrails?: A Little Help needed moving from lying on your back to sitting on the side of a flat bed without using bedrails?: A Little Help needed moving to and from  a bed to a chair (including a wheelchair)?: A Little Help needed standing up from a chair using your arms (e.g., wheelchair or bedside chair)?: A Little Help needed to walk in hospital room?: A Little Help needed climbing 3-5 steps with a railing? : A Little 6 Click Score: 18    End of Session   Activity Tolerance: Patient limited by fatigue;Patient limited by pain Patient left: in chair;with call bell/phone within reach;with family/visitor present Nurse Communication: Mobility status PT Visit Diagnosis: Muscle weakness (generalized) (M62.81);Pain     Time: 9702-6378 PT Time Calculation (min) (ACUTE ONLY): 23 min  Charges:  $Gait Training: 23-37 mins                     [place] Pager [phone] Office [phone]    Jen[place], [place]en 01/17/2021, 2:14 PM

## 2021-01-17 NOTE — Progress Notes (Signed)
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  GI following.  Currently on TF via postpyloric NG tube and IV pain medications.  Subjective: Seen and examined earlier this morning.  No major events overnight or this morning.  Still with significant abdominal pain.  She rates her pain is 8/10.  She also reports right-sided low back pain.  She says she takes Robaxin at home.  She denies chest pain or shortness of breath.  Still tachycardic to 110s.  Per patient and patient's mother, significant improvement in her leg swelling.  Objective: Vitals:   01/17/21 0426 01/17/21 0844 01/17/21 1000 01/17/21 1200  BP:   (!) 145/85 140/90  Pulse:   (!) 114 (!) 107  Resp:   (!) 22 19  Temp:  98.8 F (37.1 C)    TempSrc:  Axillary    SpO2:   97% 97%  Weight: 103.1 kg     Height:        Intake/Output Summary (Last 24 hours) at 01/17/2021 1317 Last data filed at 01/17/2021 0400 Gross per 24 hour  Intake 1675 ml  Output 150 ml  Net 1525 ml   Filed Weights   01/15/21 0356 01/16/21 0452 01/17/21 0426  Weight: 102.7 kg 103.1 kg 103.1 kg    Examination:  GENERAL: No apparent distress.  Nontoxic. HEENT: MMM.  NGT in place. NECK: Supple.  No apparent JVD.  RESP:  No IWOB.  Fair aeration bilaterally. CVS:  RRR. Heart sounds normal.  ABD/GI/GU: BS+. Abd soft.  Diffuse tenderness.  No CVA tenderness. MSK/EXT:  Moves extremities.  BLE edema.  TTP over right lumbar paraspinal muscles.   SKIN: no apparent skin lesion or wound NEURO: Awake, alert  and oriented appropriately.  No apparent focal neuro deficit. PSYCH: Calm. Normal affect.   Procedures:  None  Microbiology summarized: [id number] and influenza PCR nonreactive. MRSA PCR screen negative.  Assessment & Plan: Post ERCP acute pancreatitis without necrosis or infection-noted on CT.  Had markedly elevated lipase. Lipase normalized but continues to have significant pain -GI following. -Has been tolerating TF at 55 cc an hour via postpyloric NGT but tube clogged. -IR consulted as RN was not able to unclog -As needed IV analgesic for pain control and as needed IV antiemetics   Acute respiratory failure with hypoxia-likely due to atelectasis and possibly due to pleural effusion: Could be due to the above.  Diuresed with IV Lasix and had about 5 L UOP.  BNP within normal.  Edema improved.  CXR with moderate bilateral pleural effusions with slight improvement -Hold IV Lasix in the setting of acute pancreatitis -Closely monitor urine output -Wean oxygen as able -Encourage IS/OOB/PT/OT -Consider thoracocentesis if no improvement over the next few days     Acute kidney injury with concern for urinary retention: AKI resolved.  Recent Labs    01/08/21 0500 01/09/21 0556 01/10/21 0543 01/11/21 0612 01/12/21 0540 01/13/21 1694 01/14/21 0723 01/15/21 0256 01/16/21 0253 01/17/21 0305  BUN 9 15 33* 35* 32* 28* 24* 23* 21* 20  CREATININE 0.69 0.90 1.65* 1.28* 1.06* 0.92  1.02* 0.89 0.87 0.84  -Hold IV Lasix -Continue monitoring   Normocytic anemia: H&H stable. Recent Labs    01/08/21 0500 01/09/21 0556 01/10/21 0543 01/11/21 0612 01/12/21 0540 01/13/21 2595 01/14/21 0723 01/15/21 0256 01/16/21 0253 01/17/21 0305  HGB 14.4 14.1 13.9 11.8* 11.0* 11.5* 11.1* 11.8* 10.9* 11.0*  -Continue monitoring -Check anemia panel in the morning  Chronic Back pain-tenderness over right lumbar paraspinal muscles.  No CVA tenderness -IV fentanyl as above -Added p.o. Robaxin and  lidocaine patch  Hyperglycemia without diagnosis of diabetes: A1c 5.3%.  Recent Labs  Lab 01/16/21 1940 01/16/21 2336 01/17/21 0359 01/17/21 0745 01/17/21 1148  GLUCAP 201* 211* 201* 231* 151*  -Increase SSI to moderate -Continue monitoring  Sinus tachycardia: HR in 110s.  Could be due to pain. -Pain control as above -Check TSH  Hypokalemia: Resolved.  Pulmonary nodules?  CT chest in 11/2020 showed RLL GG airspace opacity measuring 1.3 cm. -Pulmonary follow-up and repeat CT in 6 to 12 months was recommended     Leukocytosis: Demargination? -Continue monitoring  History of depression and anxiety: Stable -Continue Lexapro   Class II obesity Body mass index is 35.6 kg/m. Nutrition Problem: Increased nutrient needs Etiology: acute illness (severe acute pancreatitis) Signs/Symptoms: estimated needs Interventions: Tube feeding, Prostat   DVT prophylaxis:  enoxaparin (LOVENOX) injection 40 mg Start: 01/08/21 1000  Code Status: Full code Family Communication: Patient and/or RN.  Updated patient's mother at bedside. Level of care: Progressive Status is: Inpatient  Remains inpatient appropriate because:Ongoing active pain requiring inpatient pain management, IV treatments appropriate due to intensity of illness or inability to take PO, and Inpatient level of care appropriate due to severity of illness  Dispo:  Patient From: Home  Planned Disposition: Home  Medically stable for discharge: No         Consultants:  Gastroenterology   Sch Meds:  Scheduled Meds:  chlorhexidine  15 mL Mouth Rinse BID   Chlorhexidine Gluconate Cloth  6 each Topical Daily   enoxaparin (LOVENOX) injection  40 mg Subcutaneous Q24H   escitalopram  10 mg Oral Daily   feeding supplement (PROSource TF)  45 mL Per Tube TID   insulin aspart  0-15 Units Subcutaneous Q4H   lidocaine  1 patch Transdermal Q24H   mouth rinse  15 mL Mouth Rinse q12n4p   pantoprazole (PROTONIX) IV  40 mg  Intravenous Q12H   Continuous Infusions:  feeding supplement (OSMOLITE 1.5 CAL) 55 mL/hr at 01/16/21 1850   PRN Meds:.alum & mag hydroxide-simeth, fentaNYL (SUBLIMAZE) injection, fentaNYL (SUBLIMAZE) injection, methocarbamol, metoCLOPramide (REGLAN) injection, ondansetron (ZOFRAN) IV, polyvinyl alcohol, senna, simethicone  Antimicrobials: Anti-infectives (From admission, onward)    None        I have personally reviewed the following labs and images: CBC: Recent Labs  Lab 01/13/21 0648 01/14/21 0723 01/15/21 0256 01/16/21 0253 01/17/21 0305  WBC 12.0* 11.9* 13.3* 12.8* 13.3*  HGB 11.5* 11.1* 11.8* 10.9* 11.0*  HCT 36.6 34.4* 36.8 33.7* 34.7*  MCV 94.1 90.3 89.8 90.3 91.6  PLT 319 292 253 195 196   BMP &GFR Recent Labs  Lab 01/13/21 0648 01/13/21 1707 01/14/21 0252 01/14/21 0723 01/14/21 1638 01/15/21 0256 01/16/21 0253 01/17/21 0305  NA 144  --   --  143  --  139 140 139  K 3.8  --   --  3.0*  --  3.0* 3.2* 3.8  CL 115*  --   --  107  --  101 102 97*  CO2 24  --   --  30  --  31 32 33*  GLUCOSE 142*  --   --  168*  --  172* 197* 218*  BUN 28*  --   --  24*  --  23* 21* 20  CREATININE 0.92  --   --  1.02*  --  0.89 0.87 0.84  CALCIUM 8.0*  --   --  8.0*  --  7.8* 7.7* 7.9*  MG 2.2 2.1 1.9  --  1.9  --   --   --   PHOS 2.0* 2.7 2.8  --  2.5  --   --   --    Estimated Creatinine Clearance: 98.9 mL/min (by C-G formula based on SCr of 0.84 mg/dL). Liver & Pancreas: Recent Labs  Lab 01/11/21 0612 01/12/21 0540 01/13/21 0648 01/14/21 0723 01/17/21 0305  AST 15 16 25 27   --   ALT 24 25 35 37  --   ALKPHOS 81 113 160* 159*  --   BILITOT 0.8 0.4 0.6 0.4  --   PROT 5.3* 5.2* 5.1* 5.0*  --   ALBUMIN 2.5* 2.2* 2.1* 2.0* 1.9*   Recent Labs  Lab 01/11/21 0612 01/13/21 0648  LIPASE 72* 24   No results for input(s): AMMONIA in the last 168 hours. Diabetic: Recent Labs    01/15/21 0256  HGBA1C 5.3   Recent Labs  Lab 01/16/21 1940 01/16/21 2336  01/17/21 0359 01/17/21 0745 01/17/21 1148  GLUCAP 201* 211* 201* 231* 151*   Cardiac Enzymes: No results for input(s): CKTOTAL, CKMB, CKMBINDEX, TROPONINI in the last 168 hours. No results for input(s): PROBNP in the last 8760 hours. Coagulation Profile: No results for input(s): INR, PROTIME in the last 168 hours. Thyroid Function Tests: No results for input(s): TSH, T4TOTAL, FREET4, T3FREE, THYROIDAB in the last 72 hours. Lipid Profile: No results for input(s): CHOL, HDL, LDLCALC, TRIG, CHOLHDL, LDLDIRECT in the last 72 hours. Anemia Panel: No results for input(s): VITAMINB12, FOLATE, FERRITIN, TIBC, IRON, RETICCTPCT in the last 72 hours. Urine analysis:    Component Value Date/Time   COLORURINE AMBER (A) 09/22/2012 1625   APPEARANCEUR TURBID (A) 09/22/2012 1625   LABSPEC 1.034 (H) 09/22/2012 1625   PHURINE 5.0 09/22/2012 1625   GLUCOSEU NEGATIVE 09/22/2012 1625   HGBUR SMALL (A) 09/22/2012 1625   BILIRUBINUR SMALL (A) 09/22/2012 1625   KETONESUR 15 (A) 09/22/2012 1625   PROTEINUR NEGATIVE 09/22/2012 1625   UROBILINOGEN 0.2 09/22/2012 1625   NITRITE NEGATIVE 09/22/2012 1625   LEUKOCYTESUR SMALL (A) 09/22/2012 1625   Sepsis Labs: Invalid input(s): PROCALCITONIN, [place]  Microbiology: Recent Results (from the past 240 hour(s))  Resp Panel by RT-PCR (Flu A&B, Covid) Nasopharyngeal Swab     Status: None   Collection Time: 01/08/21  7:49 AM   Specimen: Nasopharyngeal Swab; Nasopharyngeal(NP) swabs in vial transport medium  Result Value Ref Range Status   SARS Coronavirus 2 by RT PCR NEGATIVE NEGATIVE Final    Comment: (NOTE) SARS-CoV-2 target nucleic acids are NOT DETECTED.  The SARS-CoV-2 RNA is generally detectable in upper respiratory specimens during the acute phase of infection. The lowest concentration of SARS-CoV-2 viral copies this assay can detect is 138 copies/mL. A negative result does not preclude SARS-Cov-2 infection and should not be used as the sole  basis for treatment or other patient management decisions. A negative result may occur with  improper specimen collection/handling, submission of specimen other than nasopharyngeal swab, presence of viral mutation(s) within the areas targeted by this assay, and inadequate number of  viral copies(<138 copies/mL). A negative result must be combined with clinical observations, patient history, and epidemiological information. The expected result is Negative.  Fact Sheet for Patients:  EntrepreneurPulse.com.au  Fact Sheet for Healthcare Providers:  IncredibleEmployment.be  This test is no t yet approved or cleared by the [place] FDA and  has been authorized for detection and/or diagnosis of SARS-CoV-2 by FDA under an Emergency Use Authorization (EUA). This EUA will remain  in effect (meaning this test can be used) for the duration of the COVID-19 declaration under Section 564(b)(1) of the Act, 21 U.S.C.section 360bbb-3(b)(1), unless the authorization is terminated  or revoked sooner.       Influenza A by PCR NEGATIVE NEGATIVE Final   Influenza B by PCR NEGATIVE NEGATIVE Final    Comment: (NOTE) The Xpert Xpress SARS-CoV-2/FLU/RSV plus assay is intended as an aid in the diagnosis of influenza from Nasopharyngeal swab specimens and should not be used as a sole basis for treatment. Nasal washings and aspirates are unacceptable for Xpert Xpress SARS-CoV-2/FLU/RSV testing.  Fact Sheet for Patients: EntrepreneurPulse.com.au  Fact Sheet for Healthcare Providers: IncredibleEmployment.be  This test is not yet approved or cleared by the [place] FDA and has been authorized for detection and/or diagnosis of SARS-CoV-2 by FDA under an Emergency Use Authorization (EUA). This EUA will remain in effect (meaning this test can be used) for the duration of the COVID-19 declaration under Section 564(b)(1) of the Act,  21 U.S.C. section 360bbb-3(b)(1), unless the authorization is terminated or revoked.  Performed at [facility], [place] [address] [place], [place] [address]   MRSA PCR Screening     Status: None   Collection Time: 01/13/21 10:45 AM   Specimen: Nasal Mucosa; Nasopharyngeal  Result Value Ref Range Status   MRSA by PCR NEGATIVE NEGATIVE Final    Comment:        The GeneXpert MRSA Assay (FDA approved for NASAL specimens only), is one component of a comprehensive MRSA colonization surveillance program. It is not intended to diagnose MRSA infection nor to guide or monitor treatment for MRSA infections. Performed at [facility], [place] [address], [place], [place] [address]     Radiology Studies: DG CHEST PORT 1 VIEW  Result Date: 01/17/2021 CLINICAL DATA:  Pleural effusions.  Pancreatitis. EXAM: PORTABLE CHEST 1 VIEW COMPARISON:  January 13, 2021. FINDINGS: Moderate bilateral pleural effusions, likely slightly improved. Overlying bibasilar opacities. No visible pneumothorax on this semi erect radiograph. Patient is rotated to the right with right paratracheal lucency representing the trachea. Enteric tube courses below the diaphragm in outside the field of view. Cardiac silhouette is largely obscured, but likely similar to prior. IMPRESSION: 1. Moderate bilateral pleural effusions, likely slightly improved. 2. Overlying bibasilar opacities, which could represent compressive atelectasis and/or pneumonia. Electronically Signed   By: Margaretha Sheffield MD   On: 01/17/2021 11:19   DG Abd Portable 2V  Result Date: 01/17/2021 CLINICAL DATA:  Abdominal bloating.  Pleural effusions. EXAM: PORTABLE ABDOMEN - 2 VIEW COMPARISON:  January 13, 2021. FINDINGS: Dobbhoff catheter with tip in similar position, likely in the proximal duodenum. Right upper quadrant clips. Nonobstructive bowel gas pattern with gas in small bowel and colon. No dilated gas-filled bowel loops  identified. No obvious free air on these supine/semi erect radiographs. No abnormal calcifications. Chest is further evaluated on concurrent chest radiograph. IMPRESSION: 1. Dobbhoff catheter with tip in similar position, likely in the proximal duodenum. 2. Nonobstructive bowel gas pattern. 3. Chest is further  evaluated on concurrent chest radiograph. Electronically Signed   By: Margaretha Sheffield MD   On: 01/17/2021 11:16      Ramal Eckhardt T. [place]  If 7PM-7AM, please contact night-coverage [email or web address] 01/17/2021, 1:17 PM

## 2021-01-17 NOTE — Progress Notes (Signed)
RN noticed clogged NG tube.  Unsuccessful attempts to flush with air and warm water.  MD placed order for pancreatic enzymes to assist in unclogging.  RN unable to push enzymes into tube.  All attempts to unclog unsuccessful.  MD notified.  IR consulted by MD.  RN will continue to carefully monitor.

## 2021-01-17 NOTE — Progress Notes (Signed)
Kimberly Kelly  Kimberly Kelly 50 y.o. Dec 20, 1970  CC:  Severe acute post-ERCP pancreatitis  Subjective: Patient states upper abdominal pain is improving, though she now has lower abdominal pain which she states may be back pain.  She also has quite a bit of pain in lower back which she states feels like a spasm. She states she takes Robaxin PRN at home.  Had nausea/vomiting after PO potassium.  Otherwise, tolerating tube feeds and clear liquids.  Discontinued laxatives and had a soft, formed stool today.  ROS : Review of Systems  Respiratory:  Negative for cough and shortness of breath.   Gastrointestinal:  Positive for abdominal pain. Negative for blood in stool, constipation, diarrhea, heartburn, melena, nausea and vomiting.  Musculoskeletal:  Positive for back pain.    Objective: Vital signs in last 24 hours: Vitals:   01/17/21 0400 01/17/21 0844  BP: 137/82   Pulse: (!) 116   Resp: (!) 21   Temp:  98.8 F (37.1 C)  SpO2: 94%     Physical Exam:  General:  Alert, acutely ill-appearing, mild acute distress due to back pain, oriented, cooperative  Head:  Normocephalic, without obvious abnormality, atraumatic  Eyes:  Anicteric sclera, EOMs intact  Lungs:   Breathing comfortably on room air  Heart:  Tachycardic   Abdomen:   Mildly firm, moderate diffuse tenderness, normoactive bowel sounds  Extremities: Extremities normal, atraumatic, no  edema    Lab Results: Recent Labs    01/14/21 1638 01/15/21 0256 01/16/21 0253 01/17/21 0305  NA  --    < > 140 139  K  --    < > 3.2* 3.8  CL  --    < > 102 97*  CO2  --    < > 32 33*  GLUCOSE  --    < > 197* 218*  BUN  --    < > 21* 20  CREATININE  --    < > 0.87 0.84  CALCIUM  --    < > 7.7* 7.9*  MG 1.9  --   --   --   PHOS 2.5  --   --   --    < > = values in this interval not displayed.    Recent Labs    01/17/21 0305  ALBUMIN 1.9*    Recent Labs    01/16/21 0253 01/17/21 0305  WBC 12.8*  13.3*  HGB 10.9* 11.0*  HCT 33.7* 34.7*  MCV 90.3 91.6  PLT 195 196    No results for input(s): LABPROT, INR in the last 72 hours.  Assessment: Severe acute post-ERCP pancreatitis, clinically improving -WBCs 13.3 -Normal BUN (20) and Cr (0.84)  Hypoalbuminemia: 1.9  Bilateral pleural effusions -Normal BNP today  Plan: Continue tube feeds with NJ tube.  Continue clear liquid diet.  CXR to re-evaluate pleural effusions. If improved, consider initiation of LR at 50 cc/h.  Abdominal x-rays to evaluate bowel gas pattern, given worsening lower abdominal pain.  Continue supportive care. Continue to trend renal function.  [facility] will follow.  Salley Slaughter PA-C 01/17/2021, 10:12 AM  Contact #  [phone]

## 2021-01-17 NOTE — Progress Notes (Signed)
OT Cancellation Note  Patient Details Name: Kimberly Kelly MRN: [id number] DOB: [birth year]   Cancelled Treatment:      Patient reports having a rough day today with clogged NG tube and vomiting. Walked with PT earlier to day. Politely declined OT at this time, "I just don't know if I have it in me." Will re-attempt at later date.   Delbert Phenix OT OT pager: [place] 01/17/2021, 1:56 PM

## 2021-01-18 ENCOUNTER — Inpatient Hospital Stay (HOSPITAL_COMMUNITY): Payer: BC Managed Care – PPO

## 2021-01-18 DIAGNOSIS — N179 Acute kidney failure, unspecified: Secondary | ICD-10-CM | POA: Diagnosis not present

## 2021-01-18 DIAGNOSIS — R197 Diarrhea, unspecified: Secondary | ICD-10-CM

## 2021-01-18 DIAGNOSIS — K9189 Other postprocedural complications and disorders of digestive system: Secondary | ICD-10-CM | POA: Diagnosis not present

## 2021-01-18 DIAGNOSIS — M545 Low back pain, unspecified: Secondary | ICD-10-CM | POA: Diagnosis not present

## 2021-01-18 DIAGNOSIS — K858 Other acute pancreatitis without necrosis or infection: Secondary | ICD-10-CM | POA: Diagnosis not present

## 2021-01-18 DIAGNOSIS — D72825 Bandemia: Secondary | ICD-10-CM

## 2021-01-18 LAB — TSH: TSH: 3.284 u[IU]/mL (ref 0.350–4.500)

## 2021-01-18 LAB — BASIC METABOLIC PANEL
Anion gap: 10 (ref 5–15)
BUN: 16 mg/dL (ref 6–20)
CO2: 32 mmol/L (ref 22–32)
Calcium: 8 mg/dL — ABNORMAL LOW (ref 8.9–10.3)
Chloride: 97 mmol/L — ABNORMAL LOW (ref 98–111)
Creatinine, Ser: 0.9 mg/dL (ref 0.44–1.00)
GFR, Estimated: >60 mL/min (ref >60)
Glucose, Bld: 215 mg/dL — ABNORMAL HIGH (ref 70–99)
Potassium: 3.6 mmol/L (ref 3.5–5.1)
Sodium: 139 mmol/L (ref 135–145)

## 2021-01-18 LAB — CBC
HCT: 35.8 % — ABNORMAL LOW (ref 36.0–46.0)
Hemoglobin: 11.4 g/dL — ABNORMAL LOW (ref 12.0–15.0)
MCH: 28.4 pg (ref 26.0–34.0)
MCHC: 31.8 g/dL (ref 30.0–36.0)
MCV: 89.1 fL (ref 80.0–100.0)
Platelets: 267 10*3/uL (ref 150–400)
RBC: 4.02 MIL/uL (ref 3.87–5.11)
RDW: 14 % (ref 11.5–15.5)
WBC: 16.4 10*3/uL — ABNORMAL HIGH (ref 4.0–10.5)
nRBC: 0 % (ref 0.0–0.2)

## 2021-01-18 LAB — GLUCOSE, CAPILLARY
Glucose-Capillary: 144 mg/dL — ABNORMAL HIGH (ref 70–99)
Glucose-Capillary: 165 mg/dL — ABNORMAL HIGH (ref 70–99)
Glucose-Capillary: 173 mg/dL — ABNORMAL HIGH (ref 70–99)
Glucose-Capillary: 189 mg/dL — ABNORMAL HIGH (ref 70–99)
Glucose-Capillary: 195 mg/dL — ABNORMAL HIGH (ref 70–99)
Glucose-Capillary: 200 mg/dL — ABNORMAL HIGH (ref 70–99)

## 2021-01-18 MED ORDER — ACETAMINOPHEN 160 MG/5ML PO SOLN
650.0000 mg | Freq: Four times a day (QID) | ORAL | Status: DC | PRN
  Administered 2021-01-18 – 2021-01-21 (×5): 650 mg via ORAL
  Filled 2021-01-18 (×5): qty 20.3

## 2021-01-18 MED ORDER — PANTOPRAZOLE SODIUM 40 MG PO PACK
40.0000 mg | PACK | Freq: Two times a day (BID) | ORAL | Status: DC
  Administered 2021-01-18 – 2021-01-21 (×7): 40 mg
  Filled 2021-01-18 (×10): qty 20

## 2021-01-18 MED ORDER — LOPERAMIDE HCL 2 MG PO CAPS
4.0000 mg | ORAL_CAPSULE | Freq: Once | ORAL | Status: AC
  Administered 2021-01-18: 4 mg via ORAL
  Filled 2021-01-18: qty 2

## 2021-01-18 MED ORDER — DOXYLAMINE SUCCINATE (SLEEP) 25 MG PO TABS
25.0000 mg | ORAL_TABLET | Freq: Every evening | ORAL | Status: DC | PRN
  Administered 2021-01-18: 25 mg via ORAL
  Filled 2021-01-18 (×2): qty 1

## 2021-01-18 MED ORDER — PROCHLORPERAZINE EDISYLATE 10 MG/2ML IJ SOLN
10.0000 mg | Freq: Four times a day (QID) | INTRAMUSCULAR | Status: DC | PRN
  Filled 2021-01-18: qty 2

## 2021-01-18 NOTE — Progress Notes (Signed)
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  GI following.  Currently on TF via postpyloric NG tube and IV pain medications.  Subjective: Seen and examined earlier this morning.  Patient reports rough night from going to bathroom multiple times.  She was given Reglan 10 mg in the evening.  Abdominal pain improved.  She rates her pain 3/10.  Her WBCs are slightly up.  She reports difficulty swallowing pills.  Feels like choking when she tries to swallow.  She says her back is hurting more this morning.  She has chronic back pain.  She usually treats it with Robaxin, tramadol and hot shower.  Patient's mother at bedside.  Objective: Vitals:   01/17/21 1641 01/17/21 1700 01/17/21 2000 01/18/21 0526  BP: 135/78  129/80 137/90  Pulse: (!) 102  (!) 103 (!) 105  Resp: 18   18  Temp: 98.2 F (36.8 C)  98.6 F (37 C) 98 F (36.7 C)  TempSrc: Oral  Oral Oral  SpO2: 96%  98% 93%  Weight:  101.3 kg  100 kg  Height:        Intake/Output Summary (Last 24 hours) at 01/18/2021 1341 Last data filed at 01/18/2021 0715 Gross per 24 hour  Intake 240 ml  Output 100 ml  Net 140 ml   Filed Weights   01/17/21 0426 01/17/21 1700 01/18/21 0526  Weight: 103.1 kg 101.3 kg 100 kg    Examination:  GENERAL: No apparent distress.  Nontoxic. HEENT: MMM.  Vision and hearing grossly intact.  NECK: Supple.  No apparent JVD.  RESP:  No IWOB.  Fair aeration bilaterally. CVS:  RRR. Heart sounds normal.  ABD/GI/GU: BS+. Abd soft.  Diffuse tenderness.   No CVA tenderness. MSK/EXT:  Moves extremities. No apparent deformity.  Trace BLE edema. SKIN: no apparent skin lesion or wound NEURO: Awake and alert. Oriented appropriately.  No apparent focal neuro deficit. PSYCH: Calm. Normal affect.   Procedures:  None  Microbiology summarized: [id number] and influenza PCR nonreactive. MRSA PCR screen negative.  Assessment & Plan: Post ERCP acute pancreatitis without necrosis or infection-noted on CT.  Had markedly elevated lipase. Lipase normalized.  Abdominal pain seems to have improved. -GI following. -TF at 25 cc an hour via postpyloric NG tube -As needed Tylenol, Robaxin and IV fentanyl -Continue Zofran as needed.  Changed Reglan to Compazine given diarrhea.   Diarrhea: Patient has uptrending leukocytosis but not febrile.  Abdominal pain is better today.  Diarrhea seems to have subsided this morning.  I suspect diarrhea to be due to Reglan and tube feed versus C. difficile infection. -Changed Reglan to Compazine -May consider adding Imodium if C. difficile negative.   Acute respiratory failure with hypoxia-likely due to atelectasis and possibly due to pleural effusion: Likely due to pancreatitis.  Diuresed with IV Lasix and had about 5 L UOP.  BNP within normal.  Edema improved.  CXR with moderate bilateral pleural effusions with slight improvement -Continue holding IV Lasix in the setting of acute pancreatitis. -Closely monitor urine output -Wean oxygen as able -Encourage IS/OOB/PT/OT -Consider  thoracocentesis if no improvement over the next few days     AKI/azotemia: Resolved. Recent Labs    01/09/21 0556 01/10/21 0543 01/11/21 0612 01/12/21 0540 01/13/21 2993 01/14/21 0723 01/15/21 0256 01/16/21 0253 01/17/21 0305 01/18/21 0440  BUN 15 33* 35* 32* 28* 24* 23* 21* 20 16  CREATININE 0.90 1.65* 1.28* 1.06* 0.92 1.02* 0.89 0.87 0.84 0.90  -Continue monitoring   Normocytic anemia: H&H stable. Recent Labs    01/09/21 0556  01/10/21 0543 01/11/21 0612 01/12/21 0540 01/13/21 7169 01/14/21 0723 01/15/21 0256 01/16/21 0253 01/17/21 0305 01/18/21 0440  HGB 14.1 13.9 11.8* 11.0* 11.5* 11.1* 11.8* 10.9* 11.0* 11.4*  -Continue monitoring -Check anemia panel in the morning  Chronic Back pain-tenderness over right lumbar paraspinal muscles.  No CVA tenderness -IV fentanyl, p.o. Robaxin, Tylenol and heating pad -Encourage patient to stay out of the bed  Hyperglycemia without diagnosis of diabetes: A1c 5.3%.  Recent Labs  Lab 01/17/21 2008 01/18/21 0003 01/18/21 0520 01/18/21 0729 01/18/21 1131  GLUCAP 160* 165* 200* 189* 173*  -Continue SSI-moderate -Continue monitoring  Sinus tachycardia: HR about 100.  Could be due to pain.  TSH within normal. -Pain control as above  Hypokalemia: Resolved.  Pulmonary nodules?  CT chest in 11/2020 showed RLL GG airspace opacity measuring 1.3 cm. -Pulmonary follow-up and repeat CT in 6 to 12 months was recommended   Leukocytosis: Suspect demargination versus infectious process. -Continue monitoring  History of depression and anxiety: Stable -Continue Lexapro   Class II obesity Body mass index is 34.52 kg/m. Nutrition Problem: Increased nutrient needs Etiology: acute illness (severe acute pancreatitis) Signs/Symptoms: estimated needs Interventions: Tube feeding, Prostat   DVT prophylaxis:  enoxaparin (LOVENOX) injection 40 mg Start: 01/08/21 1000  Code Status: Full code Family Communication: Patient and/or RN.  Updated patient's mother at bedside. Level of care: Progressive Status is: Inpatient  Remains inpatient appropriate because:Ongoing active pain requiring inpatient pain management, IV treatments appropriate due to intensity of illness or inability to take PO, and Inpatient level of care appropriate due to severity of illness  Dispo:  Patient From: Home  Planned Disposition: Home  Medically stable for discharge: No         Consultants:   Gastroenterology   Sch Meds:  Scheduled Meds:  chlorhexidine  15 mL Mouth Rinse BID   Chlorhexidine Gluconate Cloth  6 each Topical Daily   enoxaparin (LOVENOX) injection  40 mg Subcutaneous Q24H   escitalopram  10 mg Oral Daily   feeding supplement (PROSource TF)  45 mL Per Tube TID   insulin aspart  0-15 Units Subcutaneous Q4H   lidocaine  1 patch Transdermal Q24H   mouth rinse  15 mL Mouth Rinse q12n4p   pantoprazole sodium  40 mg Per Tube BID   Continuous Infusions:  feeding supplement (OSMOLITE 1.5 CAL) 1,000 mL (01/17/21 1840)   PRN Meds:.acetaminophen (TYLENOL) oral liquid 160 mg/5 mL, alum & mag hydroxide-simeth, doxylamine (Sleep), fentaNYL (SUBLIMAZE) injection, fentaNYL (SUBLIMAZE) injection, methocarbamol, ondansetron (ZOFRAN) IV, polyvinyl alcohol, prochlorperazine, simethicone  Antimicrobials: Anti-infectives (From admission, onward)    None        I have personally reviewed the following labs and images: CBC: Recent Labs  Lab 01/14/21 0723 01/15/21 0256 01/16/21 0253 01/17/21 0305 01/18/21 0440  WBC 11.9* 13.3* 12.8* 13.3* 16.4*  HGB 11.1* 11.8* 10.9* 11.0* 11.4*  HCT 34.4* 36.8 33.7* 34.7* 35.8*  MCV 90.3 89.8 90.3 91.6 89.1  PLT 292 253 195 196 267   BMP &GFR Recent Labs  Lab 01/13/21 0648 01/13/21 1707 01/14/21 0252 01/14/21 0723 01/14/21 1638 01/15/21 0256 01/16/21 0253 01/17/21 0305 01/18/21 0440  NA 144  --   --  143  --  139 140 139 139  K 3.8  --   --  3.0*  --  3.0* 3.2* 3.8 3.6  CL 115*  --   --  107  --  101 102 97* 97*  CO2 24  --   --  30  --  31 32 33* 32  GLUCOSE 142*  --   --  168*  --  172* 197* 218* 215*  BUN 28*  --   --  24*  --  23* 21* 20 16  CREATININE 0.92  --   --  1.02*  --  0.89 0.87 0.84 0.90  CALCIUM 8.0*  --   --  8.0*  --  7.8* 7.7* 7.9* 8.0*  MG 2.2 2.1 1.9  --  1.9  --   --   --   --   PHOS 2.0* 2.7 2.8  --  2.5  --   --   --   --    Estimated Creatinine Clearance: 90.9 mL/min (by C-G formula based on  SCr of 0.9 mg/dL). Liver & Pancreas: Recent Labs  Lab 01/12/21 0540 01/13/21 0648 01/14/21 0723 01/17/21 0305  AST 16 25 27   --   ALT 25 35 37  --   ALKPHOS 113 160* 159*  --   BILITOT 0.4 0.6 0.4  --   PROT 5.2* 5.1* 5.0*  --   ALBUMIN 2.2* 2.1* 2.0* 1.9*   Recent Labs  Lab 01/13/21 0648  LIPASE 24   No results for input(s): AMMONIA in the last 168 hours. Diabetic: No results for input(s): HGBA1C in the last 72 hours.  Recent Labs  Lab 01/17/21 2008 01/18/21 0003 01/18/21 0520 01/18/21 0729 01/18/21 1131  GLUCAP 160* 165* 200* 189* 173*   Cardiac Enzymes: No results for input(s): CKTOTAL, CKMB, CKMBINDEX, TROPONINI in the last 168 hours. No results for input(s): PROBNP in the last 8760 hours. Coagulation Profile: No results for input(s): INR, PROTIME in the last 168 hours. Thyroid Function Tests: Recent Labs    01/18/21 0440  TSH 3.284   Lipid Profile: No results for input(s): CHOL, HDL, LDLCALC, TRIG, CHOLHDL, LDLDIRECT in the last 72 hours. Anemia Panel: No results for input(s): VITAMINB12, FOLATE, FERRITIN, TIBC, IRON, RETICCTPCT in the last 72 hours. Urine analysis:    Component Value Date/Time   COLORURINE AMBER (A) 09/22/2012 1625   APPEARANCEUR TURBID (A) 09/22/2012 1625   LABSPEC 1.034 (H) 09/22/2012 1625   PHURINE 5.0 09/22/2012 1625   GLUCOSEU NEGATIVE 09/22/2012 1625   HGBUR SMALL (A) 09/22/2012 1625   BILIRUBINUR SMALL (A) 09/22/2012 1625   KETONESUR 15 (A) 09/22/2012 1625   PROTEINUR NEGATIVE 09/22/2012 1625   UROBILINOGEN 0.2 09/22/2012 1625   NITRITE NEGATIVE 09/22/2012 1625   LEUKOCYTESUR SMALL (A) 09/22/2012 1625   Sepsis Labs: Invalid input(s): PROCALCITONIN, [place]  Microbiology: Recent Results (from the past 240 hour(s))  MRSA PCR Screening     Status: None   Collection Time: 01/13/21 10:45 AM   Specimen: Nasal Mucosa; Nasopharyngeal  Result Value Ref Range Status   MRSA by PCR NEGATIVE NEGATIVE Final    Comment:         The GeneXpert MRSA Assay (FDA approved for NASAL specimens only), is one component of a comprehensive MRSA colonization surveillance program. It is not intended to diagnose MRSA  infection nor to guide or monitor treatment for MRSA infections. Performed at [facility], [place] [address], [place], [place] [address]     Radiology Studies: DG Abd 1 View  Result Date: 01/17/2021 CLINICAL DATA:  NG tube placement EXAM: ABDOMEN - 1 VIEW COMPARISON:  01/17/2021 FINDINGS: Esophageal tube has been retracted, the tip now overlies the mid stomach. Left greater than right pleural effusions with basilar airspace disease. IMPRESSION: 1. Esophageal tube tip now overlies the mid gastric region 2. Bilateral pleural effusions and basilar airspace disease. Electronically Signed   By: Donavan Foil M.D.   On: 01/17/2021 15:51      Dilpreet Faires T. [place]  If 7PM-7AM, please contact night-coverage [email or web address] 01/18/2021, 1:41 PM

## 2021-01-18 NOTE — Progress Notes (Signed)
Subjective: Patient states she had a rough night, complains of having multiple liquid watery stools. She reports developing new symptom of nausea and vomiting after swallowing pills, she had 2-3 episodes of bilious vomiting yesterday. Her nasojejunal tube got occluded, they tried using pancreatic enzymes, however it did not work and had to be replaced. She continues to have musculoskeletal and back pain. She is asking for Tylenol, liquid for pain. She is requesting for stool to be sent for C. Difficile.  Objective: Vital signs in last 24 hours: Temp:  [98 F (36.7 C)-99.1 F (37.3 C)] 98 F (36.7 C) (06/18 0526) Pulse Rate:  [102-114] 105 (06/18 0526) Resp:  [18-22] 18 (06/18 0526) BP: (129-145)/(78-90) 137/90 (06/18 0526) SpO2:  [93 %-98 %] 93 % (06/18 0526) Weight:  [100 kg-101.3 kg] 100 kg (06/18 0526) Weight change: -1.812 kg Last BM Date: 01/17/21  PE: Nasojejunal tube in place, feedings at 25 mL/h GENERAL: Appears comfortable, sitting on bedside chair, able to speak in full sentences, on room air  ABDOMEN: Remains distended, however is nontender, normoactive bowel sounds EXTREMITIES: Some swelling noted bilaterally, however no frank pitting edema, non tender  Lab Results: Results for orders placed or performed during the hospital encounter of 01/07/21 (from the past 48 hour(s))  Glucose, capillary     Status: Abnormal   Collection Time: 01/16/21 11:27 AM  Result Value Ref Range   Glucose-Capillary 222 (H) 70 - 99 mg/dL    Comment: Glucose reference range applies only to samples taken after fasting for at least 8 hours.   Comment 1 Notify RN    Comment 2 Document in Chart   Glucose, capillary     Status: Abnormal   Collection Time: 01/16/21  3:35 PM  Result Value Ref Range   Glucose-Capillary 182 (H) 70 - 99 mg/dL    Comment: Glucose reference range applies only to samples taken after fasting for at least 8 hours.   Comment 1 Notify RN    Comment 2 Document in Chart    Glucose, capillary     Status: Abnormal   Collection Time: 01/16/21  7:40 PM  Result Value Ref Range   Glucose-Capillary 201 (H) 70 - 99 mg/dL    Comment: Glucose reference range applies only to samples taken after fasting for at least 8 hours.   Comment 1 Notify RN    Comment 2 Document in Chart   Glucose, capillary     Status: Abnormal   Collection Time: 01/16/21 11:36 PM  Result Value Ref Range   Glucose-Capillary 211 (H) 70 - 99 mg/dL    Comment: Glucose reference range applies only to samples taken after fasting for at least 8 hours.   Comment 1 Notify RN    Comment 2 Document in Chart   Basic metabolic panel     Status: Abnormal   Collection Time: 01/17/21  3:05 AM  Result Value Ref Range   Sodium 139 135 - 145 mmol/L   Potassium 3.8 3.5 - 5.1 mmol/L   Chloride 97 (L) 98 - 111 mmol/L   CO2 33 (H) 22 - 32 mmol/L   Glucose, Bld 218 (H) 70 - 99 mg/dL    Comment: Glucose reference range applies only to samples taken after fasting for at least 8 hours.   BUN 20 6 - 20 mg/dL   Creatinine, Ser 0.84 0.44 - 1.00 mg/dL   Calcium 7.9 (L) 8.9 - 10.3 mg/dL   GFR, Estimated >60 >60 mL/min    Comment: (NOTE)  Calculated using the CKD-EPI Creatinine Equation (2021)    Anion gap 9 5 - 15    Comment: Performed at [facility], [place] [address], [place], [place] [address]  CBC     Status: Abnormal   Collection Time: 01/17/21  3:05 AM  Result Value Ref Range   WBC 13.3 (H) 4.0 - 10.5 K/uL   RBC 3.79 (L) 3.87 - 5.11 MIL/uL   Hemoglobin 11.0 (L) 12.0 - 15.0 g/dL   HCT 34.7 (L) 36.0 - 46.0 %   MCV 91.6 80.0 - 100.0 fL   MCH 29.0 26.0 - 34.0 pg   MCHC 31.7 30.0 - 36.0 g/dL   RDW 14.3 11.5 - 15.5 %   Platelets 196 150 - 400 K/uL   nRBC 0.0 0.0 - 0.2 %    Comment: Performed at [facility], [place] [address], [place], [place] [address]  Brain natriuretic peptide     Status: None   Collection Time: 01/17/21  3:05 AM  Result Value Ref Range   B Natriuretic  Peptide 83.7 0.0 - 100.0 pg/mL    Comment: Performed at [facility], [place] [address] [place], [place] [address]  Albumin     Status: Abnormal   Collection Time: 01/17/21  3:05 AM  Result Value Ref Range   Albumin 1.9 (L) 3.5 - 5.0 g/dL    Comment: Performed at [facility], [place] [address] [place], [place] [address]  Glucose, capillary     Status: Abnormal   Collection Time: 01/17/21  3:59 AM  Result Value Ref Range   Glucose-Capillary 201 (H) 70 - 99 mg/dL    Comment: Glucose reference range applies only to samples taken after fasting for at least 8 hours.   Comment 1 Notify RN    Comment 2 Document in Chart   Glucose, capillary     Status: Abnormal   Collection Time: 01/17/21  7:45 AM  Result Value Ref Range   Glucose-Capillary 231 (H) 70 - 99 mg/dL    Comment: Glucose reference range applies only to samples taken after fasting for at least 8 hours.   Comment 1 Notify RN    Comment 2 Document in Chart   Glucose, capillary     Status: Abnormal   Collection Time: 01/17/21 11:48 AM  Result Value Ref Range   Glucose-Capillary 151 (H) 70 - 99 mg/dL    Comment: Glucose reference range applies only to samples taken after fasting for at least 8 hours.   Comment 1 Notify RN    Comment 2 Document in Chart   Glucose, capillary     Status: Abnormal   Collection Time: 01/17/21  4:11 PM  Result Value Ref Range   Glucose-Capillary 157 (H) 70 - 99 mg/dL    Comment: Glucose reference range applies only to samples taken after fasting for at least 8 hours.   Comment 1 Notify RN    Comment 2 Document in Chart   Glucose, capillary     Status: Abnormal   Collection Time: 01/17/21  5:24 PM  Result Value Ref Range   Glucose-Capillary 137 (H) 70 - 99 mg/dL    Comment: Glucose reference range applies only to samples taken after fasting for at least 8 hours.  Glucose, capillary     Status: Abnormal   Collection Time: 01/17/21  8:08 PM  Result Value Ref Range    Glucose-Capillary 160 (H) 70 - 99 mg/dL    Comment: Glucose reference  range applies only to samples taken after fasting for at least 8 hours.  Glucose, capillary     Status: Abnormal   Collection Time: 01/18/21 12:03 AM  Result Value Ref Range   Glucose-Capillary 165 (H) 70 - 99 mg/dL    Comment: Glucose reference range applies only to samples taken after fasting for at least 8 hours.  Basic metabolic panel     Status: Abnormal   Collection Time: 01/18/21  4:40 AM  Result Value Ref Range   Sodium 139 135 - 145 mmol/L   Potassium 3.6 3.5 - 5.1 mmol/L   Chloride 97 (L) 98 - 111 mmol/L   CO2 32 22 - 32 mmol/L   Glucose, Bld 215 (H) 70 - 99 mg/dL    Comment: Glucose reference range applies only to samples taken after fasting for at least 8 hours.   BUN 16 6 - 20 mg/dL   Creatinine, Ser 0.90 0.44 - 1.00 mg/dL   Calcium 8.0 (L) 8.9 - 10.3 mg/dL   GFR, Estimated >60 >60 mL/min    Comment: (NOTE) Calculated using the CKD-EPI Creatinine Equation (2021)    Anion gap 10 5 - 15    Comment: Performed at [facility], [place] [address] [place], [place] [address]  CBC     Status: Abnormal   Collection Time: 01/18/21  4:40 AM  Result Value Ref Range   WBC 16.4 (H) 4.0 - 10.5 K/uL   RBC 4.02 3.87 - 5.11 MIL/uL   Hemoglobin 11.4 (L) 12.0 - 15.0 g/dL   HCT 35.8 (L) 36.0 - 46.0 %   MCV 89.1 80.0 - 100.0 fL   MCH 28.4 26.0 - 34.0 pg   MCHC 31.8 30.0 - 36.0 g/dL   RDW 14.0 11.5 - 15.5 %   Platelets 267 150 - 400 K/uL   nRBC 0.0 0.0 - 0.2 %    Comment: Performed at [facility], [place] [address], [place], [place] [address]  TSH     Status: None   Collection Time: 01/18/21  4:40 AM  Result Value Ref Range   TSH 3.284 0.350 - 4.500 uIU/mL    Comment: Performed by a 3rd Generation assay with a functional sensitivity of <=0.01 uIU/mL. Performed at [facility], [place] [address], [place], [place] [address]   Glucose, capillary     Status: Abnormal    Collection Time: 01/18/21  5:20 AM  Result Value Ref Range   Glucose-Capillary 200 (H) 70 - 99 mg/dL    Comment: Glucose reference range applies only to samples taken after fasting for at least 8 hours.  Glucose, capillary     Status: Abnormal   Collection Time: 01/18/21  7:29 AM  Result Value Ref Range   Glucose-Capillary 189 (H) 70 - 99 mg/dL    Comment: Glucose reference range applies only to samples taken after fasting for at least 8 hours.    Studies/Results: DG Abd 1 View  Result Date: 01/17/2021 CLINICAL DATA:  NG tube placement EXAM: ABDOMEN - 1 VIEW COMPARISON:  01/17/2021 FINDINGS: Esophageal tube has been retracted, the tip now overlies the mid stomach. Left greater than right pleural effusions with basilar airspace disease. IMPRESSION: 1. Esophageal tube tip now overlies the mid gastric region 2. Bilateral pleural effusions and basilar airspace disease. Electronically Signed   By: Donavan Foil M.D.   On: 01/17/2021 15:51   DG CHEST PORT 1 VIEW  Result Date: 01/17/2021 CLINICAL DATA:  Pleural effusions.  Pancreatitis.  EXAM: PORTABLE CHEST 1 VIEW COMPARISON:  January 13, 2021. FINDINGS: Moderate bilateral pleural effusions, likely slightly improved. Overlying bibasilar opacities. No visible pneumothorax on this semi erect radiograph. Patient is rotated to the right with right paratracheal lucency representing the trachea. Enteric tube courses below the diaphragm in outside the field of view. Cardiac silhouette is largely obscured, but likely similar to prior. IMPRESSION: 1. Moderate bilateral pleural effusions, likely slightly improved. 2. Overlying bibasilar opacities, which could represent compressive atelectasis and/or pneumonia. Electronically Signed   By: Margaretha Sheffield MD   On: 01/17/2021 11:19   DG Abd Portable 2V  Result Date: 01/17/2021 CLINICAL DATA:  Abdominal bloating.  Pleural effusions. EXAM: PORTABLE ABDOMEN - 2 VIEW COMPARISON:  January 13, 2021. FINDINGS: Dobbhoff  catheter with tip in similar position, likely in the proximal duodenum. Right upper quadrant clips. Nonobstructive bowel gas pattern with gas in small bowel and colon. No dilated gas-filled bowel loops identified. No obvious free air on these supine/semi erect radiographs. No abnormal calcifications. Chest is further evaluated on concurrent chest radiograph. IMPRESSION: 1. Dobbhoff catheter with tip in similar position, likely in the proximal duodenum. 2. Nonobstructive bowel gas pattern. 3. Chest is further evaluated on concurrent chest radiograph. Electronically Signed   By: Margaretha Sheffield MD   On: 01/17/2021 11:16    Medications: I have reviewed the patient's current medications.  Assessment: Severe pancreatitis following ERCP at an outside facility  WBC increased to 16.4 Chest x-ray showed moderate bilateral pleural effusions, likely slightly improved, compression atelectasis versus pneumonia with bibasilar opacities  Plan: Advance to full liquid diet today, continue Protonix IV twice daily, simethicone if needed, IV metoclopramide if needed  Continue early aggressive ambulation  Monitor WBC count, if worsening noted, may require repeat imaging with CAT scan to evaluate for complications of pancreatitis such as pseudocyst, abscess or necrosis  Will add Tylenol liquid for pain as needed  Will send stool for C. difficile testing  If stool for C. difficile is negative and patient continues to have diarrhea, plan to start pancreatic enzymes.  Patient requesting Xanax for sleep at night, advised patient to discuss this with her hospitalist.  Ronnette Juniper, MD 01/18/2021, 8:40 AM

## 2021-01-19 DIAGNOSIS — K9189 Other postprocedural complications and disorders of digestive system: Secondary | ICD-10-CM | POA: Diagnosis not present

## 2021-01-19 DIAGNOSIS — M545 Low back pain, unspecified: Secondary | ICD-10-CM | POA: Diagnosis not present

## 2021-01-19 DIAGNOSIS — K858 Other acute pancreatitis without necrosis or infection: Secondary | ICD-10-CM | POA: Diagnosis not present

## 2021-01-19 DIAGNOSIS — N179 Acute kidney failure, unspecified: Secondary | ICD-10-CM | POA: Diagnosis not present

## 2021-01-19 LAB — BASIC METABOLIC PANEL
Anion gap: 8 (ref 5–15)
BUN: 15 mg/dL (ref 6–20)
CO2: 33 mmol/L — ABNORMAL HIGH (ref 22–32)
Calcium: 8 mg/dL — ABNORMAL LOW (ref 8.9–10.3)
Chloride: 99 mmol/L (ref 98–111)
Creatinine, Ser: 0.95 mg/dL (ref 0.44–1.00)
GFR, Estimated: >60 mL/min (ref >60)
Glucose, Bld: 160 mg/dL — ABNORMAL HIGH (ref 70–99)
Potassium: 3.5 mmol/L (ref 3.5–5.1)
Sodium: 140 mmol/L (ref 135–145)

## 2021-01-19 LAB — CBC
HCT: 35.2 % — ABNORMAL LOW (ref 36.0–46.0)
Hemoglobin: 11.2 g/dL — ABNORMAL LOW (ref 12.0–15.0)
MCH: 28.6 pg (ref 26.0–34.0)
MCHC: 31.8 g/dL (ref 30.0–36.0)
MCV: 90 fL (ref 80.0–100.0)
Platelets: 326 10*3/uL (ref 150–400)
RBC: 3.91 MIL/uL (ref 3.87–5.11)
RDW: 14.1 % (ref 11.5–15.5)
WBC: 17.4 10*3/uL — ABNORMAL HIGH (ref 4.0–10.5)
nRBC: 0 % (ref 0.0–0.2)

## 2021-01-19 LAB — GLUCOSE, CAPILLARY
Glucose-Capillary: 117 mg/dL — ABNORMAL HIGH (ref 70–99)
Glucose-Capillary: 141 mg/dL — ABNORMAL HIGH (ref 70–99)
Glucose-Capillary: 142 mg/dL — ABNORMAL HIGH (ref 70–99)
Glucose-Capillary: 150 mg/dL — ABNORMAL HIGH (ref 70–99)
Glucose-Capillary: 165 mg/dL — ABNORMAL HIGH (ref 70–99)
Glucose-Capillary: 229 mg/dL — ABNORMAL HIGH (ref 70–99)

## 2021-01-19 MED ORDER — ALPRAZOLAM 0.5 MG PO TABS
0.5000 mg | ORAL_TABLET | Freq: Two times a day (BID) | ORAL | Status: DC | PRN
  Administered 2021-01-20 – 2021-01-21 (×2): 0.5 mg via ORAL
  Filled 2021-01-19 (×3): qty 1

## 2021-01-19 MED ORDER — FENTANYL CITRATE (PF) 100 MCG/2ML IJ SOLN: 25.0000 ug | INTRAMUSCULAR | Status: DC | PRN

## 2021-01-19 MED ORDER — TRAMADOL HCL 50 MG PO TABS
50.0000 mg | ORAL_TABLET | Freq: Two times a day (BID) | ORAL | Status: DC | PRN
  Administered 2021-01-19 – 2021-01-20 (×2): 50 mg via ORAL
  Filled 2021-01-19 (×2): qty 1

## 2021-01-19 NOTE — Progress Notes (Signed)
Occupational Therapy Treatment Patient Details Name: Kimberly Kelly MRN: [id number] DOB: March 03, 1971 Today's Date: 01/19/2021    History of present illness This 50 y.o. female admitted s/p ERCP with acute pancratitis.   PMH includes: anxiety, lung nodules, diverticulosis, s/p cholectystectomy, frozen shoulder Lt.   OT comments  Patient with increased activity tolerance and standing balance compared to previous OT session. Patient reports has been ambulating with nursing and walker in hallway. Patient overall supervision level in standing at sink to perform upper body grooming, hygiene, bathing. Needing mod A for perianal care as patient still having difficulty reaching buttock, may benefit from toileting aid. After seated rest patient ambulate ~158ft with rolling walker demonstrating increased endurance for functional mobility. Will continue to follow to facilitate D/C home with [facility] .    Follow Up Recommendations  Home health OT;Supervision - Intermittent    Equipment Recommendations  None recommended by OT       Precautions / Restrictions Precautions Precautions: Fall Precaution Comments: monitor O2/HR; NG tube;       Mobility Bed Mobility Overal bed mobility: Modified Independent Bed Mobility: Sit to Supine       Sit to supine: Modified independent (Device/Increase time)        Transfers Overall transfer level: Needs assistance Equipment used: Rolling walker (2 wheeled);None Transfers: Sit to/from Stand Sit to Stand: Supervision Stand pivot transfers: Supervision       General transfer comment: S for safety    Balance Overall balance assessment: Needs assistance Sitting-balance support: Feet supported Sitting balance-Leahy Scale: Good     Standing balance support: No upper extremity supported Standing balance-Leahy Scale: Fair                             ADL either performed or assessed with clinical judgement   ADL Overall ADL's : Needs  assistance/impaired     Grooming: Wash/dry face;Standing;Set up;Brushing hair;Applying deodorant (washing hair with shampoo cap) Grooming Details (indicate cue type and reason): patient able to perform grooming/hygiene at set up level standing in bathroom, does endorse fatigue in standing and intermittent use of UEs on sink for support Upper Body Bathing: Set up;Standing Upper Body Bathing Details (indicate cue type and reason): patient able to wash under arms in standing, does endorse fatigue Lower Body Bathing: Moderate assistance;Sit to/from stand Lower Body Bathing Details (indicate cue type and reason): patient able to wash peri area in standing however having difficulty reaching buttock even in semi squat "I can't hold on, squat and reach just yet" needing assist to wash buttock         Toilet Transfer: Supervision/safety;RW           Functional mobility during ADLs: Supervision/safety;Rolling walker General ADL Comments: patient much improved with functional ambulation, has been ambulating in hallway with nursing staff and walker. tolerated ~ 174ft post grooming/hygiene in bathroom with OT      Cognition Arousal/Alertness: Awake/alert Behavior During Therapy: WFL for tasks assessed/performed Overall Cognitive Status: Within Functional Limits for tasks assessed                                                     Pertinent Vitals/ Pain       Pain Assessment: Faces Faces Pain Scale: Hurts even more Pain Location: back radiating R  flank Pain Descriptors / Indicators: Aching;Grimacing Pain Intervention(s): Monitored during session         Frequency  Min 2X/week        Progress Toward Goals  OT Goals(current goals can now be found in the care plan section)  Progress towards OT goals: Progressing toward goals  Acute Rehab OT Goals Patient Stated Goal: to have less pain and get back to normal OT Goal Formulation: With patient/family Time For  Goal Achievement: 01/26/21 Potential to Achieve Goals: Good ADL Goals Pt Will Perform Grooming: with modified independence;standing Pt Will Perform Upper Body Bathing: with modified independence;sitting Pt Will Perform Lower Body Bathing: with modified independence;sit to/from stand Pt Will Perform Upper Body Dressing: with modified independence;sitting;standing Pt Will Perform Lower Body Dressing: with modified independence;sit to/from stand Pt Will Transfer to Toilet: with modified independence;ambulating;regular height toilet;grab bars Pt Will Perform Toileting - Clothing Manipulation and hygiene: with modified independence;sit to/from stand Pt Will Perform Tub/Shower Transfer: Shower transfer;with modified independence;shower seat;ambulating;rolling walker  Plan Discharge plan remains appropriate       AM-PAC OT "6 Clicks" Daily Activity     Outcome Measure   Help from another person eating meals?: A Little Help from another person taking care of personal grooming?: A Little Help from another person toileting, which includes using toliet, bedpan, or urinal?: A Little Help from another person bathing (including washing, rinsing, drying)?: A Lot Help from another person to put on and taking off regular upper body clothing?: A Little Help from another person to put on and taking off regular lower body clothing?: A Lot 6 Click Score: 16    End of Session Equipment Utilized During Treatment: Gait belt;Rolling walker  OT Visit Diagnosis: Unsteadiness on feet (R26.81);Pain Pain - part of body:  (back, abdomen)   Activity Tolerance Patient tolerated treatment well   Patient Left in bed;with call bell/phone within reach;with family/visitor present   Nurse Communication Mobility status;Other (comment) (NG needing to be restarted)        Time: 0867-6195 OT Time Calculation (min): 25 min  Charges: OT General Charges $OT Visit: 1 Visit OT Treatments $Self Care/Home Management :  23-37 mins  Delbert Phenix OT OT pager: [phone]   Rosemary Holms 01/19/2021, 9:12 AM

## 2021-01-19 NOTE — Progress Notes (Signed)
Subjective: Reports marked improvement in abdominal pain, currently 3 out of 10, requesting for diet to be advanced from full liquids to more formed food.  Improvement in diarrhea, has been passing gas, stool still liquid to mushy.  Nasojejunal tube fell off yesterday evening and had to be replaced.  Complains of constant back pain.  Objective: Vital signs in last 24 hours: Temp:  [97.9 F (36.6 C)-98.3 F (36.8 C)] 98.1 F (36.7 C) (06/19 0508) Pulse Rate:  [76-105] 76 (06/19 0508) Resp:  [20] 20 (06/19 0508) BP: (132-143)/(80-98) 140/86 (06/19 0508) SpO2:  [93 %-99 %] 93 % (06/19 0508) Weight:  [98.7 kg] 98.7 kg (06/19 0500) Weight change: -2.588 kg Last BM Date: 01/18/21  PE: Appears more cheerful and comfortable today GENERAL: On room air, able to speak in full sentences, not in distress ABDOMEN: Remains distended but only minimally tender, bowel sounds audible EXTREMITIES: Marked improvement in pedal edema  Lab Results: Results for orders placed or performed during the hospital encounter of 01/07/21 (from the past 48 hour(s))  Glucose, capillary     Status: Abnormal   Collection Time: 01/17/21 11:48 AM  Result Value Ref Range   Glucose-Capillary 151 (H) 70 - 99 mg/dL    Comment: Glucose reference range applies only to samples taken after fasting for at least 8 hours.   Comment 1 Notify RN    Comment 2 Document in Chart   Glucose, capillary     Status: Abnormal   Collection Time: 01/17/21  4:11 PM  Result Value Ref Range   Glucose-Capillary 157 (H) 70 - 99 mg/dL    Comment: Glucose reference range applies only to samples taken after fasting for at least 8 hours.   Comment 1 Notify RN    Comment 2 Document in Chart   Glucose, capillary     Status: Abnormal   Collection Time: 01/17/21  5:24 PM  Result Value Ref Range   Glucose-Capillary 137 (H) 70 - 99 mg/dL    Comment: Glucose reference range applies only to samples taken after fasting for at least 8 hours.   Glucose, capillary     Status: Abnormal   Collection Time: 01/17/21  8:08 PM  Result Value Ref Range   Glucose-Capillary 160 (H) 70 - 99 mg/dL    Comment: Glucose reference range applies only to samples taken after fasting for at least 8 hours.  Glucose, capillary     Status: Abnormal   Collection Time: 01/18/21 12:03 AM  Result Value Ref Range   Glucose-Capillary 165 (H) 70 - 99 mg/dL    Comment: Glucose reference range applies only to samples taken after fasting for at least 8 hours.  Basic metabolic panel     Status: Abnormal   Collection Time: 01/18/21  4:40 AM  Result Value Ref Range   Sodium 139 135 - 145 mmol/L   Potassium 3.6 3.5 - 5.1 mmol/L   Chloride 97 (L) 98 - 111 mmol/L   CO2 32 22 - 32 mmol/L   Glucose, Bld 215 (H) 70 - 99 mg/dL    Comment: Glucose reference range applies only to samples taken after fasting for at least 8 hours.   BUN 16 6 - 20 mg/dL   Creatinine, Ser 0.90 0.44 - 1.00 mg/dL   Calcium 8.0 (L) 8.9 - 10.3 mg/dL   GFR, Estimated >60 >60 mL/min    Comment: (NOTE) Calculated using the CKD-EPI Creatinine Equation (2021)    Anion gap 10 5 - 15  Comment: Performed at [facility], [place] [address] [place], [place] [address]  CBC     Status: Abnormal   Collection Time: 01/18/21  4:40 AM  Result Value Ref Range   WBC 16.4 (H) 4.0 - 10.5 K/uL   RBC 4.02 3.87 - 5.11 MIL/uL   Hemoglobin 11.4 (L) 12.0 - 15.0 g/dL   HCT 35.8 (L) 36.0 - 46.0 %   MCV 89.1 80.0 - 100.0 fL   MCH 28.4 26.0 - 34.0 pg   MCHC 31.8 30.0 - 36.0 g/dL   RDW 14.0 11.5 - 15.5 %   Platelets 267 150 - 400 K/uL   nRBC 0.0 0.0 - 0.2 %    Comment: Performed at [facility], [place] [address] [place], [place] [address]  TSH     Status: None   Collection Time: 01/18/21  4:40 AM  Result Value Ref Range   TSH 3.284 0.350 - 4.500 uIU/mL    Comment: Performed by a 3rd Generation assay with a functional sensitivity of <=0.01 uIU/mL. Performed at [facility], [place] [address] [place],  [address]   Glucose, capillary     Status: Abnormal   Collection Time: 01/18/21  5:20 AM  Result Value Ref Range   Glucose-Capillary 200 (H) 70 - 99 mg/dL    Comment: Glucose reference range applies only to samples taken after fasting for at least 8 hours.  Glucose, capillary     Status: Abnormal   Collection Time: 01/18/21  7:29 AM  Result Value Ref Range   Glucose-Capillary 189 (H) 70 - 99 mg/dL    Comment: Glucose reference range applies only to samples taken after fasting for at least 8 hours.  Glucose, capillary     Status: Abnormal   Collection Time: 01/18/21 11:31 AM  Result Value Ref Range   Glucose-Capillary 173 (H) 70 - 99 mg/dL    Comment: Glucose reference range applies only to samples taken after fasting for at least 8 hours.  Glucose, capillary     Status: Abnormal   Collection Time: 01/18/21  3:49 PM  Result Value Ref Range   Glucose-Capillary 195 (H) 70 - 99 mg/dL    Comment: Glucose reference range applies only to samples taken after fasting for at least 8 hours.  Glucose, capillary     Status: Abnormal   Collection Time: 01/18/21  7:30 PM  Result Value Ref Range   Glucose-Capillary 144 (H) 70 - 99 mg/dL    Comment: Glucose reference range applies only to samples taken after fasting for at least 8 hours.  Glucose, capillary     Status: Abnormal   Collection Time: 01/19/21 12:39 AM  Result Value Ref Range   Glucose-Capillary 141 (H) 70 - 99 mg/dL    Comment: Glucose reference range applies only to samples taken after fasting for at least 8 hours.  Glucose, capillary     Status: Abnormal   Collection Time: 01/19/21  4:24 AM  Result Value Ref Range   Glucose-Capillary 142 (H) 70 - 99 mg/dL    Comment: Glucose reference range applies only to samples taken after fasting for at least 8 hours.  Basic metabolic panel     Status: Abnormal   Collection Time: 01/19/21  4:42 AM  Result Value Ref Range   Sodium 140 135  - 145 mmol/L   Potassium 3.5 3.5 - 5.1 mmol/L   Chloride 99 98 - 111 mmol/L   CO2 33 (H) 22 - 32  mmol/L   Glucose, Bld 160 (H) 70 - 99 mg/dL    Comment: Glucose reference range applies only to samples taken after fasting for at least 8 hours.   BUN 15 6 - 20 mg/dL   Creatinine, Ser 0.95 0.44 - 1.00 mg/dL   Calcium 8.0 (L) 8.9 - 10.3 mg/dL   GFR, Estimated >60 >60 mL/min    Comment: (NOTE) Calculated using the CKD-EPI Creatinine Equation (2021)    Anion gap 8 5 - 15    Comment: Performed at [facility], [place] [address], [place], [place] [address]  CBC     Status: Abnormal   Collection Time: 01/19/21  4:42 AM  Result Value Ref Range   WBC 17.4 (H) 4.0 - 10.5 K/uL   RBC 3.91 3.87 - 5.11 MIL/uL   Hemoglobin 11.2 (L) 12.0 - 15.0 g/dL   HCT 35.2 (L) 36.0 - 46.0 %   MCV 90.0 80.0 - 100.0 fL   MCH 28.6 26.0 - 34.0 pg   MCHC 31.8 30.0 - 36.0 g/dL   RDW 14.1 11.5 - 15.5 %   Platelets 326 150 - 400 K/uL   nRBC 0.0 0.0 - 0.2 %    Comment: Performed at [facility], [place] [address] [place], [place] [address]  Glucose, capillary     Status: Abnormal   Collection Time: 01/19/21  7:44 AM  Result Value Ref Range   Glucose-Capillary 150 (H) 70 - 99 mg/dL    Comment: Glucose reference range applies only to samples taken after fasting for at least 8 hours.    Studies/Results: DG Abd 1 View  Result Date: 01/17/2021 CLINICAL DATA:  NG tube placement EXAM: ABDOMEN - 1 VIEW COMPARISON:  01/17/2021 FINDINGS: Esophageal tube has been retracted, the tip now overlies the mid stomach. Left greater than right pleural effusions with basilar airspace disease. IMPRESSION: 1. Esophageal tube tip now overlies the mid gastric region 2. Bilateral pleural effusions and basilar airspace disease. Electronically Signed   By: Donavan Foil M.D.   On: 01/17/2021 15:51   DG CHEST PORT 1 VIEW  Result Date: 01/17/2021 CLINICAL DATA:  Pleural effusions.  Pancreatitis. EXAM: PORTABLE  CHEST 1 VIEW COMPARISON:  January 13, 2021. FINDINGS: Moderate bilateral pleural effusions, likely slightly improved. Overlying bibasilar opacities. No visible pneumothorax on this semi erect radiograph. Patient is rotated to the right with right paratracheal lucency representing the trachea. Enteric tube courses below the diaphragm in outside the field of view. Cardiac silhouette is largely obscured, but likely similar to prior. IMPRESSION: 1. Moderate bilateral pleural effusions, likely slightly improved. 2. Overlying bibasilar opacities, which could represent compressive atelectasis and/or pneumonia. Electronically Signed   By: Margaretha Sheffield MD   On: 01/17/2021 11:19   DG Abd Portable 1V  Result Date: 01/19/2021 CLINICAL DATA:  Enteric catheter placement EXAM: PORTABLE ABDOMEN - 1 VIEW COMPARISON:  01/17/2021 at 3:26 p.m. FINDINGS: Frontal view of the abdomen and pelvis was performed, excluding the hemidiaphragms and lower pelvis by collimation. The weighted tip of an enteric feeding catheter overlies the gastric antrum/pyloric region. Paucity of bowel gas. No masses or abnormal calcifications. IMPRESSION: 1. Enteric catheter weighted tip overlying the gastric antrum/pylorus. Electronically Signed   By: Randa Ngo M.D.   On: 01/19/2021 00:50   DG Abd Portable 2V  Result Date: 01/17/2021 CLINICAL DATA:  Abdominal bloating.  Pleural effusions. EXAM: PORTABLE ABDOMEN - 2 VIEW COMPARISON:  January 13, 2021. FINDINGS: Dobbhoff catheter with tip in similar position,  likely in the proximal duodenum. Right upper quadrant clips. Nonobstructive bowel gas pattern with gas in small bowel and colon. No dilated gas-filled bowel loops identified. No obvious free air on these supine/semi erect radiographs. No abnormal calcifications. Chest is further evaluated on concurrent chest radiograph. IMPRESSION: 1. Dobbhoff catheter with tip in similar position, likely in the proximal duodenum. 2. Nonobstructive bowel gas  pattern. 3. Chest is further evaluated on concurrent chest radiograph. Electronically Signed   By: Margaretha Sheffield MD   On: 01/17/2021 11:16    Medications: I have reviewed the patient's current medications.  Assessment: Post ERCP pancreatitis  Clinically improving-decrease abdominal pain, lessening diarrhea, able to tolerate full liquids without nausea or vomiting  Elevated WBC count noted, however, afebrile, there is improvement in tachycardia, complications from pancreatitis less likely, leukocytosis likely reactive. If continues to worsen, consider repeat imaging  Plan: Will discontinue enteric precautions and stool for C. difficile testing. Will advance diet to low-fat. Continue ambulation. As patient is able to tolerate diet and reach nutritional goals, we can think about discontinuing nasojejunal feeds accordingly.  Ronnette Juniper, MD 01/19/2021, 9:36 AM

## 2021-01-19 NOTE — Progress Notes (Signed)
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  GI following.  Currently on TF via nasojejunal NG tube and IV pain medications.  GI advance diet to soft.  Improving.  Subjective: Seen and examined earlier this morning.  She says she had rough night as NG tube fell off and had to be replaced.  Still with some back pain and abdominal pain but improved.  Also feels gassy.  No chest pain or dyspnea.  Asking if she can get Xanax as needed.  Reports taking this at home occasionally.  Also asking if it is possible to go out with wheelchair and get some fresh air for a few minutes.  Objective: Vitals:   01/19/21 0500 01/19/21 0508 01/19/21 0937 01/19/21 1149  BP:  140/86 134/66 (!) 150/83  Pulse:  76 97 (!) 101  Resp:  20  16  Temp:  98.1 F (36.7 C) 97.7 F (36.5 C) 98.7 F (37.1 C)  TempSrc:   Oral Oral  SpO2:  93% 97% 96%  Weight: 98.7 kg     Height:        Intake/Output Summary (Last 24 hours) at 01/19/2021 1209 Last data filed at 01/18/2021 1424 Gross per 24 hour  Intake 240 ml  Output --  Net 240 ml   Filed Weights   01/17/21 1700 01/18/21 0526 01/19/21 0500  Weight: 101.3 kg 100 kg 98.7 kg    Examination:  GENERAL: No apparent distress.  Nontoxic. HEENT: MMM.  NGT in place. NECK: Supple.  No apparent JVD.  RESP: On RA.  No IWOB.  Fair aeration bilaterally. CVS:  RRR. Heart sounds normal.  ABD/GI/GU: BS+. Abd soft,.  Mild diffuse tenderness. MSK/EXT:  Moves extremities. No apparent deformity. No edema.  SKIN: no  apparent skin lesion or wound NEURO: Awake and alert. Oriented appropriately.  No apparent focal neuro deficit. PSYCH: Calm. Normal affect.   Procedures:  None  Microbiology summarized: [id number] and influenza PCR nonreactive. MRSA PCR screen negative.  Assessment & Plan: Post ERCP acute pancreatitis without necrosis or infection-noted on CT.  Had markedly elevated lipase. Lipase normalized.  Abdominal pain improved.  Tolerating full liquid diet and TF -GI following. -TF at 45 cc an hour via nasojejunal tube. -As needed Tylenol, Robaxin and IV fentanyl -Continue Zofran and Compazine as needed -Continue simethicone -GI advance diet to low-fat  Diarrhea: Likely due to Reglan and TF. Low suspicion for C. difficile as diarrhea and abdominal pain improved.  Leukocytosis likely reactive.     Acute respiratory failure with hypoxia-likely due to atelectasis and possibly due to pleural effusion: Likely due to pancreatitis.  Diuresed with IV Lasix and had about 5 L UOP.  BNP within normal.  Edema improved.  CXR with moderate bilateral pleural effusions with slight improvement -Continue holding IV Lasix in the setting of acute pancreatitis. -Closely monitor urine output -Encourage IS/OOB/PT/OT     AKI/azotemia: Resolved. Recent Labs    01/10/21 0543 01/11/21 0612 01/12/21 0540 01/13/21 1324 01/14/21 0723 01/15/21 0256 01/16/21 0253 01/17/21 0305 01/18/21 0440 01/19/21 0442  BUN 33* 35* 32*  28* 24* 23* 21* 20 16 15   CREATININE 1.65* 1.28* 1.06* 0.92 1.02* 0.89 0.87 0.84 0.90 0.95  -Continue monitoring   Normocytic anemia: H&H stable. Recent Labs    01/10/21 0543 01/11/21 0612 01/12/21 0540 01/13/21 2458 01/14/21 0723 01/15/21 0256 01/16/21 0253 01/17/21 0305 01/18/21 0440 01/19/21 0442  HGB 13.9 11.8* 11.0* 11.5* 11.1* 11.8* 10.9* 11.0* 11.4* 11.2*  -Continue monitoring -Check anemia panel in the morning  Chronic Back pain-tenderness over right lumbar paraspinal  muscles.  No CVA tenderness -IV fentanyl, p.o. Robaxin, Tylenol and heating pad -Added tramadol -Encourage patient to stay out of the bed  Hyperglycemia without diagnosis of diabetes: A1c 5.3%.  Recent Labs  Lab 01/18/21 1930 01/19/21 0039 01/19/21 0424 01/19/21 0744 01/19/21 1144  GLUCAP 144* 141* 142* 150* 117*  -Continue SSI-moderate -Continue monitoring  Sinus tachycardia: HR in 90s to 100.  Could be due to pain.  TSH within normal. -Pain control as above  Hypokalemia: Resolved.  Pulmonary nodules?  CT chest in 11/2020 showed RLL GG airspace opacity measuring 1.3 cm. -Pulmonary follow-up and repeat CT in 6 to 12 months was recommended   Leukocytosis: Suspect demargination versus infectious process. -Continue monitoring  History of depression and anxiety: Stable -Continue Lexapro   Class II obesity Body mass index is 34.08 kg/m. Nutrition Problem: Increased nutrient needs Etiology: acute illness (severe acute pancreatitis) Signs/Symptoms: estimated needs Interventions: Tube feeding, Prostat   DVT prophylaxis:  enoxaparin (LOVENOX) injection 40 mg Start: 01/08/21 1000  Code Status: Full code Family Communication: Patient and/or RN.  Updated patient's mother at bedside. Level of care: Progressive Status is: Inpatient  Remains inpatient appropriate because:Ongoing active pain requiring inpatient pain management, IV treatments appropriate due to intensity of illness or inability to take PO, and Inpatient level of care appropriate due to severity of illness  Dispo:  Patient From: Home  Planned Disposition: Home  Medically stable for discharge: No         Consultants:  Gastroenterology   Sch Meds:  Scheduled Meds:  chlorhexidine  15 mL Mouth Rinse BID   Chlorhexidine Gluconate Cloth  6 each Topical Daily   enoxaparin (LOVENOX) injection  40 mg Subcutaneous Q24H   escitalopram  10 mg Oral Daily   feeding supplement (PROSource TF)  45 mL Per Tube TID    insulin aspart  0-15 Units Subcutaneous Q4H   lidocaine  1 patch Transdermal Q24H   mouth rinse  15 mL Mouth Rinse q12n4p   pantoprazole sodium  40 mg Per Tube BID   Continuous Infusions:  feeding supplement (OSMOLITE 1.5 CAL) 1,000 mL (01/17/21 1840)   PRN Meds:.acetaminophen (TYLENOL) oral liquid 160 mg/5 mL, ALPRAZolam, alum & mag hydroxide-simeth, doxylamine (Sleep), fentaNYL (SUBLIMAZE) injection, methocarbamol, ondansetron (ZOFRAN) IV, polyvinyl alcohol, prochlorperazine, simethicone, traMADol  Antimicrobials: Anti-infectives (From admission, onward)    None        I have personally reviewed the following labs and images: CBC: Recent Labs  Lab 01/15/21 0256 01/16/21 0253 01/17/21 0305 01/18/21 0440 01/19/21 0442  WBC 13.3* 12.8* 13.3* 16.4* 17.4*  HGB 11.8* 10.9* 11.0* 11.4* 11.2*  HCT 36.8 33.7* 34.7* 35.8* 35.2*  MCV 89.8 90.3 91.6 89.1 90.0  PLT 253 195 196 267 326   BMP &GFR Recent Labs  Lab 01/13/21 0648 01/13/21 1707 01/14/21 0252 01/14/21 0723 01/14/21 1638 01/15/21 0256 01/16/21 0253 01/17/21 0305 01/18/21 0440 01/19/21 0442  NA 144  --   --    < >  --  139 140 139 139  140  K 3.8  --   --    < >  --  3.0* 3.2* 3.8 3.6 3.5  CL 115*  --   --    < >  --  101 102 97* 97* 99  CO2 24  --   --    < >  --  31 32 33* 32 33*  GLUCOSE 142*  --   --    < >  --  172* 197* 218* 215* 160*  BUN 28*  --   --    < >  --  23* 21* 20 16 15   CREATININE 0.92  --   --    < >  --  0.89 0.87 0.84 0.90 0.95  CALCIUM 8.0*  --   --    < >  --  7.8* 7.7* 7.9* 8.0* 8.0*  MG 2.2 2.1 1.9  --  1.9  --   --   --   --   --   PHOS 2.0* 2.7 2.8  --  2.5  --   --   --   --   --    < > = values in this interval not displayed.   Estimated Creatinine Clearance: 85.4 mL/min (by C-G formula based on SCr of 0.95 mg/dL). Liver & Pancreas: Recent Labs  Lab 01/13/21 0648 01/14/21 0723 01/17/21 0305  AST 25 27  --   ALT 35 37  --   ALKPHOS 160* 159*  --   BILITOT 0.6 0.4  --    PROT 5.1* 5.0*  --   ALBUMIN 2.1* 2.0* 1.9*   Recent Labs  Lab 01/13/21 0648  LIPASE 24   No results for input(s): AMMONIA in the last 168 hours. Diabetic: No results for input(s): HGBA1C in the last 72 hours.  Recent Labs  Lab 01/18/21 1930 01/19/21 0039 01/19/21 0424 01/19/21 0744 01/19/21 1144  GLUCAP 144* 141* 142* 150* 117*   Cardiac Enzymes: No results for input(s): CKTOTAL, CKMB, CKMBINDEX, TROPONINI in the last 168 hours. No results for input(s): PROBNP in the last 8760 hours. Coagulation Profile: No results for input(s): INR, PROTIME in the last 168 hours. Thyroid Function Tests: Recent Labs    01/18/21 0440  TSH 3.284   Lipid Profile: No results for input(s): CHOL, HDL, LDLCALC, TRIG, CHOLHDL, LDLDIRECT in the last 72 hours. Anemia Panel: No results for input(s): VITAMINB12, FOLATE, FERRITIN, TIBC, IRON, RETICCTPCT in the last 72 hours. Urine analysis:    Component Value Date/Time   COLORURINE AMBER (A) 09/22/2012 1625   APPEARANCEUR TURBID (A) 09/22/2012 1625   LABSPEC 1.034 (H) 09/22/2012 1625   PHURINE 5.0 09/22/2012 1625   GLUCOSEU NEGATIVE 09/22/2012 1625   HGBUR SMALL (A) 09/22/2012 1625   BILIRUBINUR SMALL (A) 09/22/2012 1625   KETONESUR 15 (A) 09/22/2012 1625   PROTEINUR NEGATIVE 09/22/2012 1625   UROBILINOGEN 0.2 09/22/2012 1625   NITRITE NEGATIVE 09/22/2012 1625   LEUKOCYTESUR SMALL (A) 09/22/2012 1625   Sepsis Labs: Invalid input(s): PROCALCITONIN, [place]  Microbiology: Recent Results (from the past 240 hour(s))  MRSA PCR Screening     Status: None   Collection Time: 01/13/21 10:45 AM   Specimen: Nasal Mucosa; Nasopharyngeal  Result Value Ref Range Status   MRSA by PCR NEGATIVE NEGATIVE Final    Comment:        The GeneXpert MRSA Assay (FDA approved for NASAL specimens only), is one component of a comprehensive MRSA colonization surveillance program. It is not intended to diagnose  MRSA infection nor to guide or monitor  treatment for MRSA infections. Performed at [facility], [place] [address] [place], [place] [address]     Radiology Studies: DG Abd Portable 1V  Result Date: 01/19/2021 CLINICAL DATA:  Enteric catheter placement EXAM: PORTABLE ABDOMEN - 1 VIEW COMPARISON:  01/17/2021 at 3:26 p.m. FINDINGS: Frontal view of the abdomen and pelvis was performed, excluding the hemidiaphragms and lower pelvis by collimation. The weighted tip of an enteric feeding catheter overlies the gastric antrum/pyloric region. Paucity of bowel gas. No masses or abnormal calcifications. IMPRESSION: 1. Enteric catheter weighted tip overlying the gastric antrum/pylorus. Electronically Signed   By: Randa Ngo M.D.   On: 01/19/2021 00:50      Leven Hoel T. [place]  If 7PM-7AM, please contact night-coverage [email or web address] 01/19/2021, 12:09 PM

## 2021-01-20 DIAGNOSIS — M545 Low back pain, unspecified: Secondary | ICD-10-CM | POA: Diagnosis not present

## 2021-01-20 DIAGNOSIS — K858 Other acute pancreatitis without necrosis or infection: Secondary | ICD-10-CM | POA: Diagnosis not present

## 2021-01-20 DIAGNOSIS — K9189 Other postprocedural complications and disorders of digestive system: Secondary | ICD-10-CM | POA: Diagnosis not present

## 2021-01-20 DIAGNOSIS — N179 Acute kidney failure, unspecified: Secondary | ICD-10-CM | POA: Diagnosis not present

## 2021-01-20 LAB — COMPREHENSIVE METABOLIC PANEL
ALT: 64 U/L — ABNORMAL HIGH (ref 0–44)
AST: 46 U/L — ABNORMAL HIGH (ref 15–41)
Albumin: 2.1 g/dL — ABNORMAL LOW (ref 3.5–5.0)
Alkaline Phosphatase: 185 U/L — ABNORMAL HIGH (ref 38–126)
Anion gap: 7 (ref 5–15)
BUN: 16 mg/dL (ref 6–20)
CO2: 35 mmol/L — ABNORMAL HIGH (ref 22–32)
Calcium: 7.8 mg/dL — ABNORMAL LOW (ref 8.9–10.3)
Chloride: 100 mmol/L (ref 98–111)
Creatinine, Ser: 0.96 mg/dL (ref 0.44–1.00)
GFR, Estimated: >60 mL/min (ref >60)
Glucose, Bld: 170 mg/dL — ABNORMAL HIGH (ref 70–99)
Potassium: 3.5 mmol/L (ref 3.5–5.1)
Sodium: 142 mmol/L (ref 135–145)
Total Bilirubin: 0.4 mg/dL (ref 0.3–1.2)
Total Protein: 5.2 g/dL — ABNORMAL LOW (ref 6.5–8.1)

## 2021-01-20 LAB — GLUCOSE, CAPILLARY
Glucose-Capillary: 141 mg/dL — ABNORMAL HIGH (ref 70–99)
Glucose-Capillary: 159 mg/dL — ABNORMAL HIGH (ref 70–99)
Glucose-Capillary: 163 mg/dL — ABNORMAL HIGH (ref 70–99)
Glucose-Capillary: 166 mg/dL — ABNORMAL HIGH (ref 70–99)
Glucose-Capillary: 168 mg/dL — ABNORMAL HIGH (ref 70–99)
Glucose-Capillary: 172 mg/dL — ABNORMAL HIGH (ref 70–99)
Glucose-Capillary: 172 mg/dL — ABNORMAL HIGH (ref 70–99)

## 2021-01-20 LAB — FERRITIN: Ferritin: 300 ng/mL (ref 11–307)

## 2021-01-20 LAB — CBC
HCT: 30.5 % — ABNORMAL LOW (ref 36.0–46.0)
Hemoglobin: 9.8 g/dL — ABNORMAL LOW (ref 12.0–15.0)
MCH: 29.2 pg (ref 26.0–34.0)
MCHC: 32.1 g/dL (ref 30.0–36.0)
MCV: 90.8 fL (ref 80.0–100.0)
Platelets: 339 10*3/uL (ref 150–400)
RBC: 3.36 MIL/uL — ABNORMAL LOW (ref 3.87–5.11)
RDW: 14.1 % (ref 11.5–15.5)
WBC: 14.3 10*3/uL — ABNORMAL HIGH (ref 4.0–10.5)
nRBC: 0 % (ref 0.0–0.2)

## 2021-01-20 LAB — IRON AND TIBC
Iron: 26 ug/dL — ABNORMAL LOW (ref 28–170)
Saturation Ratios: 15 % (ref 10.4–31.8)
TIBC: 176 ug/dL — ABNORMAL LOW (ref 250–450)
UIBC: 150 ug/dL

## 2021-01-20 LAB — RETICULOCYTES
Immature Retic Fract: 25.5 % — ABNORMAL HIGH (ref 2.3–15.9)
RBC.: 3.44 MIL/uL — ABNORMAL LOW (ref 3.87–5.11)
Retic Count, Absolute: 66.4 10*3/uL (ref 19.0–186.0)
Retic Ct Pct: 1.9 % (ref 0.4–3.1)

## 2021-01-20 LAB — MAGNESIUM: Magnesium: 2.1 mg/dL (ref 1.7–2.4)

## 2021-01-20 LAB — FOLATE: Folate: 5.3 ng/mL — ABNORMAL LOW (ref >5.9)

## 2021-01-20 LAB — PHOSPHORUS: Phosphorus: 5.7 mg/dL — ABNORMAL HIGH (ref 2.5–4.6)

## 2021-01-20 LAB — VITAMIN B12: Vitamin B-12: 715 pg/mL (ref 180–914)

## 2021-01-20 MED ORDER — PROSOURCE TF PO LIQD
45.0000 mL | Freq: Two times a day (BID) | ORAL | Status: DC
  Administered 2021-01-20 – 2021-01-21 (×2): 45 mL
  Filled 2021-01-20 (×3): qty 45

## 2021-01-20 MED ORDER — KATE FARMS STANDARD 1.4 PO LIQD
1138.0000 mL | ORAL | Status: DC
  Administered 2021-01-20: 1138 mL
  Filled 2021-01-20 (×2): qty 1300

## 2021-01-20 MED ORDER — FOLIC ACID 1 MG PO TABS
1.0000 mg | ORAL_TABLET | Freq: Every day | ORAL | Status: DC
  Administered 2021-01-20 – 2021-01-22 (×3): 1 mg via ORAL
  Filled 2021-01-20 (×3): qty 1

## 2021-01-20 MED ORDER — TRAMADOL HCL 50 MG PO TABS
50.0000 mg | ORAL_TABLET | Freq: Three times a day (TID) | ORAL | Status: DC | PRN
  Administered 2021-01-20 – 2021-01-21 (×3): 50 mg via ORAL
  Filled 2021-01-20 (×3): qty 1

## 2021-01-20 NOTE — Progress Notes (Signed)
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  GI following.  Currently on TF via nasojejunal NG tube and IV pain medications.  GI advance diet to low-fat.  Improving.  Subjective: Seen and examined earlier this morning. She did fairly with low-fat diet yesterday but had an emesis late in the afternoon.  Otherwise, reports having a good night.  Both abdominal and back pain improved this morning.  She rates her pain 3/10.  Working on her breakfast this morning.  Patient's mother at bedside.  Objective: Vitals:   01/19/21 1149 01/19/21 1951 01/20/21 0409 01/20/21 0500  BP: (!) 150/83 133/70 133/81   Pulse: (!) 101 (!) 110 (!) 102   Resp: 16 20 20    Temp: 98.7 F (37.1 C) 98.6 F (37 C) 98.4 F (36.9 C)   TempSrc: Oral Oral Oral   SpO2: 96% 95% 90%   Weight:    97.3 kg  Height:        Intake/Output Summary (Last 24 hours) at 01/20/2021 1329 Last data filed at 01/20/2021 0500 Gross per 24 hour  Intake 1385 ml  Output --  Net 1385 ml   Filed Weights   01/18/21 0526 01/19/21 0500 01/20/21 0500  Weight: 100 kg 98.7 kg 97.3 kg    Examination:  GENERAL: No apparent distress.  Nontoxic. HEENT: MMM.  Vision and hearing grossly intact.  NECK: Supple.  No apparent JVD.  RESP:  No IWOB.  Fair aeration bilaterally. CVS:  RRR. Heart sounds normal.  ABD/GI/GU: BS+. Abd soft.  Mild discomfort. MSK/EXT:  Moves extremities. No apparent deformity.  Trace edema bilaterally. SKIN: no apparent skin lesion or wound NEURO: Awake and  alert. Oriented appropriately.  No apparent focal neuro deficit. PSYCH: Calm. Normal affect.   Procedures:  None  Microbiology summarized: [id number] and influenza PCR nonreactive. MRSA PCR screen negative.  Assessment & Plan: Post ERCP acute pancreatitis without necrosis or infection-noted on CT.  Had markedly elevated lipase. Lipase normalized. On TF via nasojejunal tube.  Also on low-fat diet.  Abdominal pain improved. -[facility] following-planning to decrease TF while increasing p.o. intake -TF at 45 cc an hour via nasojejunal tube. -As needed Tylenol, Robaxin, tramadol and IV fentanyl -Continue Zofran and Compazine as needed -Continue simethicone  Diarrhea: Likely due to Reglan and TF. Low suspicion for C. difficile as diarrhea and abdominal pain improved.  Leukocytosis likely reactive and improving.   Acute respiratory failure with hypoxia-likely due to atelectasis and possibly due to pleural effusion: Likely due to pancreatitis.  Diuresed with IV Lasix and had about 5 L UOP.  BNP within normal.  Edema improved.  CXR with moderate bilateral pleural effusions with slight improvement -Continue holding IV Lasix in the setting of acute pancreatitis. -Closely monitor urine output -Encourage IS/OOB/PT/OT  Elevated liver enzymes: Likely due to #1 Recent Labs  Lab 01/14/21 0723 01/17/21 0305 01/20/21 0438  AST 27  --  46*  ALT 37  --  64*  ALKPHOS 159*  --  185*  BILITOT 0.4  --  0.4  PROT 5.0*  --  5.2*  ALBUMIN 2.0* 1.9* 2.1*  -Recheck in the morning.    AKI/azotemia: Resolved. Recent Labs    01/11/21 0612 01/12/21 0540 01/13/21 0648 01/14/21 0723 01/15/21 0256 01/16/21 0253 01/17/21 0305 01/18/21 0440 01/19/21 0442 01/20/21 0438  BUN 35* 32* 28* 24* 23* 21* 20 16 15 16   CREATININE 1.28* 1.06* 0.92 1.02* 0.89 0.87 0.84 0.90 0.95 0.96  -Continue monitoring   Normocytic anemia: Slight drop in Hgb this morning.  Dilutional?  No report of GI bleed. Recent Labs     01/11/21 0612 01/12/21 0540 01/13/21 0648 01/14/21 0723 01/15/21 0256 01/16/21 0253 01/17/21 0305 01/18/21 0440 01/19/21 0442 01/20/21 0438  HGB 11.8* 11.0* 11.5* 11.1* 11.8* 10.9* 11.0* 11.4* 11.2* 9.8*  -Continue monitoring -Folic acid supplementation  Chronic Back pain-tenderness over right lumbar paraspinal muscles.  No CVA tenderness -IV fentanyl, p.o. Robaxin, Tylenol, tramadol and heating pad -Encourage ambulation  Hyperglycemia without diagnosis of diabetes: A1c 5.3%.  Recent Labs  Lab 01/19/21 1946 01/20/21 0001 01/20/21 0404 01/20/21 0742 01/20/21 1207  GLUCAP 165* 166* 172* 163* 168*  -Continue SSI-moderate -Continue monitoring  Sinus tachycardia: HR about 100 for most part.  Could be due to pain and deconditioning.  TSH within normal. -Pain control as above -Ambulate patient  Hypokalemia: Resolved.  Pulmonary nodules?  CT chest in 11/2020 showed RLL GG airspace opacity measuring 1.3 cm. -Pulmonary follow-up and repeat CT in 6 to 12 months was recommended   Leukocytosis: Suspect demargination versus infectious process. -Continue monitoring  History of depression and anxiety: Stable -Continue Lexapro.  -Low-dose Xanax as needed   Class II obesity Body mass index is 33.6 kg/m. Nutrition Problem: Increased nutrient needs Etiology: acute illness (severe acute pancreatitis) Signs/Symptoms: estimated needs Interventions: Tube feeding, Prostat   DVT prophylaxis:  enoxaparin (LOVENOX) injection 40 mg Start: 01/08/21 1000  Code Status: Full code Family Communication: Patient and/or RN.  Updated patient's mother at bedside. Level of care: Med-Surg Status is: Inpatient  Remains inpatient appropriate because:Ongoing active pain requiring inpatient pain management, IV treatments appropriate due to intensity of illness or inability to take PO, and Inpatient level of care appropriate due to severity of illness  Dispo:  Patient From: Home  Planned  Disposition: Home  Medically stable for discharge: No         Consultants:  Gastroenterology   Sch Meds:  Scheduled Meds:  chlorhexidine  15 mL Mouth Rinse BID   Chlorhexidine Gluconate Cloth  6 each Topical Daily   enoxaparin (LOVENOX) injection  40 mg Subcutaneous Q24H   escitalopram  10 mg Oral Daily   feeding supplement (PROSource TF)  45 mL Per Tube TID   insulin aspart  0-15 Units Subcutaneous Q4H   lidocaine  1 patch Transdermal Q24H   mouth rinse  15 mL Mouth Rinse q12n4p   pantoprazole sodium  40 mg Per Tube BID   Continuous Infusions:  feeding supplement (OSMOLITE 1.5 CAL) 1,000 mL (01/17/21 1840)   PRN Meds:.acetaminophen (TYLENOL) oral liquid 160 mg/5 mL, ALPRAZolam, alum & mag hydroxide-simeth, doxylamine (Sleep), fentaNYL (SUBLIMAZE) injection, methocarbamol, ondansetron (ZOFRAN) IV, polyvinyl alcohol, prochlorperazine, simethicone, traMADol  Antimicrobials: Anti-infectives (From admission, onward)    None        I have personally reviewed the following labs and images: CBC: Recent Labs  Lab 01/16/21 0253 01/17/21 0305 01/18/21 0440 01/19/21 0442 01/20/21 0438  WBC 12.8* 13.3* 16.4* 17.4* 14.3*  HGB 10.9* 11.0* 11.4* 11.2* 9.8*  HCT 33.7* 34.7* 35.8* 35.2* 30.5*  MCV 90.3 91.6  89.1 90.0 90.8  PLT 195 196 267 326 339   BMP &GFR Recent Labs  Lab 01/13/21 1707 01/14/21 0252 01/14/21 0723 01/14/21 1638 01/15/21 0256 01/16/21 0253 01/17/21 0305 01/18/21 0440 01/19/21 0442 01/20/21 0438  NA  --   --    < >  --    < > 140 139 139 140 142  K  --   --    < >  --    < > 3.2* 3.8 3.6 3.5 3.5  CL  --   --    < >  --    < > 102 97* 97* 99 100  CO2  --   --    < >  --    < > 32 33* 32 33* 35*  GLUCOSE  --   --    < >  --    < > 197* 218* 215* 160* 170*  BUN  --   --    < >  --    < > 21* 20 16 15 16   CREATININE  --   --    < >  --    < > 0.87 0.84 0.90 0.95 0.96  CALCIUM  --   --    < >  --    < > 7.7* 7.9* 8.0* 8.0* 7.8*  MG 2.1 1.9  --  1.9   --   --   --   --   --  2.1  PHOS 2.7 2.8  --  2.5  --   --   --   --   --  5.7*   < > = values in this interval not displayed.   Estimated Creatinine Clearance: 84 mL/min (by C-G formula based on SCr of 0.96 mg/dL). Liver & Pancreas: Recent Labs  Lab 01/14/21 0723 01/17/21 0305 01/20/21 0438  AST 27  --  46*  ALT 37  --  64*  ALKPHOS 159*  --  185*  BILITOT 0.4  --  0.4  PROT 5.0*  --  5.2*  ALBUMIN 2.0* 1.9* 2.1*   No results for input(s): LIPASE, AMYLASE in the last 168 hours.  No results for input(s): AMMONIA in the last 168 hours. Diabetic: No results for input(s): HGBA1C in the last 72 hours.  Recent Labs  Lab 01/19/21 1946 01/20/21 0001 01/20/21 0404 01/20/21 0742 01/20/21 1207  GLUCAP 165* 166* 172* 163* 168*   Cardiac Enzymes: No results for input(s): CKTOTAL, CKMB, CKMBINDEX, TROPONINI in the last 168 hours. No results for input(s): PROBNP in the last 8760 hours. Coagulation Profile: No results for input(s): INR, PROTIME in the last 168 hours. Thyroid Function Tests: Recent Labs    01/18/21 0440  TSH 3.284   Lipid Profile: No results for input(s): CHOL, HDL, LDLCALC, TRIG, CHOLHDL, LDLDIRECT in the last 72 hours. Anemia Panel: Recent Labs    01/20/21 0438  VITAMINB12 715  FOLATE 5.3*  FERRITIN 300  TIBC 176*  IRON 26*  RETICCTPCT 1.9   Urine analysis:    Component Value Date/Time   COLORURINE AMBER (A) 09/22/2012 1625   APPEARANCEUR TURBID (A) 09/22/2012 1625   LABSPEC 1.034 (H) 09/22/2012 1625   PHURINE 5.0 09/22/2012 1625   GLUCOSEU NEGATIVE 09/22/2012 1625   HGBUR SMALL (A) 09/22/2012 1625   BILIRUBINUR SMALL (A) 09/22/2012 1625   KETONESUR 15 (A) 09/22/2012 1625   PROTEINUR NEGATIVE 09/22/2012 1625   UROBILINOGEN 0.2 09/22/2012 1625   NITRITE NEGATIVE 09/22/2012 1625   LEUKOCYTESUR  SMALL (A) 09/22/2012 1625   Sepsis Labs: Invalid input(s): PROCALCITONIN, [place]  Microbiology: Recent Results (from the past 240 hour(s))   MRSA PCR Screening     Status: None   Collection Time: 01/13/21 10:45 AM   Specimen: Nasal Mucosa; Nasopharyngeal  Result Value Ref Range Status   MRSA by PCR NEGATIVE NEGATIVE Final    Comment:        The GeneXpert MRSA Assay (FDA approved for NASAL specimens only), is one component of a comprehensive MRSA colonization surveillance program. It is not intended to diagnose MRSA infection nor to guide or monitor treatment for MRSA infections. Performed at [facility], [place] [address] [place], [place] [address]     Radiology Studies: No results found.    Caylin Nass T. [place]  If 7PM-7AM, please contact night-coverage [email or web address] 01/20/2021, 1:29 PM

## 2021-01-20 NOTE — Progress Notes (Signed)
Nutrition Follow-up  DOCUMENTATION CODES:   Not applicable  INTERVENTION:  - will adjust TF regimen: [employer] 1.4 @ 65 ml/hr x16 hours/day (1800-1000) with 45 ml Prosource TF BID. - this regimen will provide 1536 kcal, 86 grams protein, 20 grams fat, and 749 ml free water.   NUTRITION DIAGNOSIS:   Increased nutrient needs related to acute illness (severe acute pancreatitis) as evidenced by estimated needs. -ongoing  GOAL:   Patient will meet greater than or equal to 90% of their needs -to be met with TF regimen and PO intake  MONITOR:   TF tolerance, PO intake, Labs, Weight trends  REASON FOR ASSESSMENT:   Consult Assessment of nutrition requirement/status, Enteral/tube feeding initiation and management  ASSESSMENT:   50 y.o. female with medical history significant for lung nodules, anxiety/depression who presents with worsening abdominal pain following an ERCP.  Diet advanced from CLD to [place] on 6/18 at 0950 and then to Heart Healthy yesterday at [place]. On 6/18 she ate 100% of lunch and yesterday she ate 25% of lunch.  Patient has small bore NGT which was replaced on 6/18 (noted to be at or near the pylorus).   She is receiving Osmolite 1.5 @ 55 ml/hr (goal rate) with 45 ml Prosource TF TID. This regimen is providing 2100 kcal, 116 grams protein, 65 grams of fat, and 1006 ml free water.   Patient reports overt fullness. Discussed options to aid with increasing PO intake as patient reports this is a barrier to d/c for her. Changes to be made outlined above. Discussed current diet order and things to be mindful of with increasing PO intake. Encouraged family/visitors to bring low-fat items that she enjoys.   Weight has been trending down throughout hospitalization.    Labs reviewed; CBGs: 166, 172, 163, 168 mg/dl, Ca: 7.8 mg/dl, Phos: 5.7 mg/dl, LFTs slightly elevated.  Medications reviewed; 1 mg folvite/day, sliding scale novolog, 40 mg protonix BID.    NUTRITION -  FOCUSED PHYSICAL EXAM:  Completed; no muscle or fat depletions, moderate pitting edema to BLE.   Diet Order:   Diet Order             Diet Heart Room service appropriate? Yes; Fluid consistency: Thin  Diet effective now                   EDUCATION NEEDS:   Education needs have been addressed  Skin:  Skin Assessment: Reviewed RN Assessment  Last BM:  6/20 (type 5 x2)  Height:   Ht Readings from Last 1 Encounters:  01/14/21 5' 7" (1.702 m)    Weight:   Wt Readings from Last 1 Encounters:  01/20/21 97.3 kg      Estimated Nutritional Needs:  Kcal:  2000-2200 Protein:  120-130g Fluid:  2L/day       Jarome Matin, MS, RD, LDN, CNSC Inpatient Clinical Dietitian RD pager # available in AMION  After hours/weekend pager # available in [facility]

## 2021-01-20 NOTE — Progress Notes (Signed)
Physical Therapy Treatment Patient Details Name: Kimberly Kelly MRN: [id number] DOB: February 25, 1971 Today's Date: 01/20/2021    History of Present Illness This 50 y.o. female admitted s/p ERCP with acute pancratitis.   PMH includes: anxiety, lung nodules, diverticulosis, s/p cholectystectomy, frozen shoulder Lt.    PT Comments    The patient reports that she has ambulated around unit x 3 today. Ambulated x 2 laps-440'- pushing IV pole. Patient expresses concern for having B/B on second level. Will  practice  steps next visit, see how her stamina is.   Follow Up Recommendations  Home health PT- may not need, will decide  nearer to DC.     Equipment Recommendations   (TBA,) asking about a shower seat.   Recommendations for Other Services       Precautions / Restrictions Precautions Precaution Comments: ; NG tube    Mobility  Bed Mobility           Sit to supine: Independent   General bed mobility comments: standing up in room    Transfers                    Ambulation/Gait Ambulation/Gait assistance: Supervision Gait Distance (Feet): 440 Feet Assistive device: IV Pole Gait Pattern/deviations: Step-through pattern;Decreased stride length     General Gait Details: 1 rest standing break   Stairs             Wheelchair Mobility    Modified Rankin (Stroke Patients Only)       Balance   Sitting-balance support: Feet supported Sitting balance-Leahy Scale: Good     Standing balance support: No upper extremity supported Standing balance-Leahy Scale: Fair                              Cognition Arousal/Alertness: Awake/alert                                            Exercises      General Comments        Pertinent Vitals/Pain Faces Pain Scale: Hurts little more Pain Location: back radiating R flank Pain Descriptors / Indicators: Discomfort;Nagging;Guarding Pain Intervention(s): Monitored during  session;Premedicated before session    Home Living                      Prior Function            PT Goals (current goals can now be found in the care plan section) Progress towards PT goals: Progressing toward goals    Frequency    Min 3X/week      PT Plan      Co-evaluation              AM-PAC PT "6 Clicks" Mobility   Outcome Measure  Help needed turning from your back to your side while in a flat bed without using bedrails?: None Help needed moving from lying on your back to sitting on the side of a flat bed without using bedrails?: None Help needed moving to and from a bed to a chair (including a wheelchair)?: A Little Help needed standing up from a chair using your arms (e.g., wheelchair or bedside chair)?: A Little Help needed to walk in hospital room?: A Little Help needed climbing 3-5 steps with a railing? : A  Lot 6 Click Score: 19    End of Session   Activity Tolerance: Patient tolerated treatment well Patient left: in bed;with call bell/phone within reach;with family/visitor present Nurse Communication: Mobility status PT Visit Diagnosis: Muscle weakness (generalized) (M62.81);Pain;Difficulty in walking, not elsewhere classified (R26.2) Pain - Right/Left: Right Pain - part of body: Hip     Time: 1432-1450 PT Time Calculation (min) (ACUTE ONLY): 18 min  Charges:  $Gait Training: 8-22 mins                     [place] Pager [phone] Office [phone]    [place], [place] 01/20/2021, 3:02 PM

## 2021-01-20 NOTE — Progress Notes (Signed)
[facility] Gastroenterology Progress Note  Kimberly Kelly 50 y.o. [birth year]  CC:  Post-ERCP pancreatitis  Subjective: Patient states upper abdominal pain is improving, now 4/10.  Her back pain  has also improved. She has been ambulating. She is tolerating a low-fat diet but feels full quickly.  She is wondering if tube feeds can be decreased while she tries to increase PO intake.  Had one episode of vomiting last night which she thinks was related to gag reflex and feeding tube.  Is having more formed stools, no longer having liquid stools.  ROS : Review of Systems  Respiratory:  Negative for cough and shortness of breath.   Gastrointestinal:  Positive for abdominal pain. Negative for blood in stool, constipation, diarrhea, heartburn, melena, nausea and vomiting.  Musculoskeletal:  Positive for back pain.    Objective: Vital signs in last 24 hours: Vitals:   01/19/21 1951 01/20/21 0409  BP: 133/70 133/81  Pulse: (!) 110 (!) 102  Resp: 20 20  Temp: 98.6 F (37 C) 98.4 F (36.9 C)  SpO2: 95% 90%    Physical Exam:  General:  oriented, cooperative  Head:  Normocephalic, without obvious abnormality, atraumatic  Eyes:  Anicteric sclera, EOMs intact  Lungs:   Breathing comfortably on room air  Heart:  Mildly tachycardic   Abdomen:   Soft, mild epigastric tenderness, normoactive bowel sounds  Extremities: Mild pedal edema, improving    Lab Results: Recent Labs    01/19/21 0442 01/20/21 0438  NA 140 142  K 3.5 3.5  CL 99 100  CO2 33* 35*  GLUCOSE 160* 170*  BUN 15 16  CREATININE 0.95 0.96  CALCIUM 8.0* 7.8*  MG  --  2.1  PHOS  --  5.7*    Recent Labs    01/20/21 0438  AST 46*  ALT 64*  ALKPHOS 185*  BILITOT 0.4  PROT 5.2*  ALBUMIN 2.1*    Recent Labs    01/19/21 0442 01/20/21 0438  WBC 17.4* 14.3*  HGB 11.2* 9.8*  HCT 35.2* 30.5*  MCV 90.0 90.8  PLT 326 339    No results for input(s): LABPROT, INR in the last 72 hours.  Assessment: Severe acute  post-ERCP pancreatitis, clinically improving -WBCs 14.3, improved from 17.4 yesterday, likely reactive -Normal BUN (16) and Cr (0.96)  Hypoalbuminemia: 2.1  Plan: Continue low-fat diet.  Will discuss with nutrition if tube feeds can be decreased while increasing PO intake, given early satiety.  Continue supportive care.  [facility] will follow.  Salley Slaughter PA-C 01/20/2021, 10:33 AM  Contact #  [phone]

## 2021-01-21 DIAGNOSIS — K858 Other acute pancreatitis without necrosis or infection: Secondary | ICD-10-CM | POA: Diagnosis not present

## 2021-01-21 LAB — COMPREHENSIVE METABOLIC PANEL
ALT: 68 U/L — ABNORMAL HIGH (ref 0–44)
AST: 53 U/L — ABNORMAL HIGH (ref 15–41)
Albumin: 2.3 g/dL — ABNORMAL LOW (ref 3.5–5.0)
Alkaline Phosphatase: 209 U/L — ABNORMAL HIGH (ref 38–126)
Anion gap: 7 (ref 5–15)
BUN: 18 mg/dL (ref 6–20)
CO2: 32 mmol/L (ref 22–32)
Calcium: 8 mg/dL — ABNORMAL LOW (ref 8.9–10.3)
Chloride: 102 mmol/L (ref 98–111)
Creatinine, Ser: 1.17 mg/dL — ABNORMAL HIGH (ref 0.44–1.00)
GFR, Estimated: 57 mL/min — ABNORMAL LOW (ref >60)
Glucose, Bld: 189 mg/dL — ABNORMAL HIGH (ref 70–99)
Potassium: 4 mmol/L (ref 3.5–5.1)
Sodium: 141 mmol/L (ref 135–145)
Total Bilirubin: 0.7 mg/dL (ref 0.3–1.2)
Total Protein: 5.6 g/dL — ABNORMAL LOW (ref 6.5–8.1)

## 2021-01-21 LAB — GLUCOSE, CAPILLARY
Glucose-Capillary: 160 mg/dL — ABNORMAL HIGH (ref 70–99)
Glucose-Capillary: 134 mg/dL — ABNORMAL HIGH (ref 70–99)
Glucose-Capillary: 129 mg/dL — ABNORMAL HIGH (ref 70–99)
Glucose-Capillary: 121 mg/dL — ABNORMAL HIGH (ref 70–99)
Glucose-Capillary: 140 mg/dL — ABNORMAL HIGH (ref 70–99)

## 2021-01-21 LAB — CBC
HCT: 31.6 % — ABNORMAL LOW (ref 36.0–46.0)
Hemoglobin: 9.8 g/dL — ABNORMAL LOW (ref 12.0–15.0)
MCH: 28.6 pg (ref 26.0–34.0)
MCHC: 31 g/dL (ref 30.0–36.0)
MCV: 92.1 fL (ref 80.0–100.0)
Platelets: 359 10*3/uL (ref 150–400)
RBC: 3.43 MIL/uL — ABNORMAL LOW (ref 3.87–5.11)
RDW: 14.2 % (ref 11.5–15.5)
WBC: 15.2 10*3/uL — ABNORMAL HIGH (ref 4.0–10.5)
nRBC: 0 % (ref 0.0–0.2)

## 2021-01-21 LAB — PHOSPHORUS: Phosphorus: 5 mg/dL — ABNORMAL HIGH (ref 2.5–4.6)

## 2021-01-21 LAB — MAGNESIUM: Magnesium: 2.4 mg/dL (ref 1.7–2.4)

## 2021-01-21 NOTE — Progress Notes (Signed)
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  GI following.  Currently on TF via nasojejunal NG tube and IV pain medications.  GI advance diet to low-fat.  Improving.  Subjective: Patient seen and examined.  Mother at the bedside.  She states that she was feeling better yesterday but her abdominal pain is slightly worse today and she is feeling full.  No new complaint.  Objective: Vitals:   01/21/21 0441 01/21/21 0500 01/21/21 1240 01/21/21 1312  BP: 133/89  136/86   Pulse: (!) 110  (!) 125 (!) 104  Resp: 18     Temp: 98.2 F (36.8 C)  98.3 F (36.8 C)   TempSrc:   Axillary   SpO2: 92%  96%   Weight:  96.2 kg    Height:        Intake/Output Summary (Last 24 hours) at 01/21/2021 1329 Last data filed at 01/21/2021 1000 Gross per 24 hour  Intake 1005 ml  Output 1 ml  Net 1004 ml    Filed Weights   01/19/21 0500 01/20/21 0500 01/21/21 0500  Weight: 98.7 kg 97.3 kg 96.2 kg    Examination:  General exam: Appears calm and comfortable  Respiratory system: Clear to auscultation. Respiratory effort normal. Cardiovascular system: S1 & S2 heard, RRR. No JVD, murmurs, rubs, gallops or clicks. No pedal edema. Gastrointestinal system: Abdomen is soft, mildly distended and slightly tender in the epigastrium no organomegaly or masses felt. Normal bowel sounds heard. Central nervous system: Alert and oriented. No focal neurological deficits. Extremities: Symmetric 5 x 5 power. Skin: No rashes, lesions or ulcers.  Psychiatry:  Judgement and insight appear normal. Mood & affect appropriate.    Procedures:  None  Microbiology summarized: NFAOZ-30 and influenza PCR nonreactive. MRSA PCR screen negative.  Assessment & Plan: Post ERCP acute pancreatitis without necrosis or infection-noted on CT.  Had markedly elevated lipase. Lipase normalized. On TF via nasojejunal tube.  Also on low-fat diet.  Abdominal pain improved yesterday but slightly worse today. -[facility] following-planning to decrease TF while increasing p.o. intake -TF at 65 cc an hour via nasojejunal tube. -As needed Tylenol, Robaxin, tramadol and IV fentanyl -Continue Zofran and Compazine as needed -Continue simethicone  Diarrhea: Likely due to Reglan and TF. Low suspicion for C. difficile as diarrhea and abdominal pain improved.  Leukocytosis stable   Acute respiratory failure with hypoxia-likely due to atelectasis and possibly due to pleural effusion: Likely due to pancreatitis.  Diuresed with IV Lasix and had about 5 L UOP.  BNP within normal.  Edema improved.  CXR with moderate bilateral pleural effusions with slight improvement -Continue holding IV Lasix in the setting of acute pancreatitis. -Closely monitor urine output -Encourage IS/OOB/PT/OT  Elevated liver enzymes: Likely due to #1 Recent Labs  Lab 01/17/21 0305 01/20/21 0438 01/21/21 0425  AST  --  46* 53*  ALT  --  64* 68*  ALKPHOS  --  185* 209*  BILITOT  --  0.4 0.7  PROT  --  5.2* 5.6*  ALBUMIN 1.9* 2.1* 2.3*   -Recheck in  the morning.    AKI/azotemia: Had resolved earlier but some jump in creatinine today.  I encouraged her to drink more fluids.  Hopefully her renal function will improve tomorrow. Recent Labs    01/12/21 0540 01/13/21 0648 01/14/21 0723 01/15/21 0256 01/16/21 0253 01/17/21 0305 01/18/21 0440 01/19/21 0442 01/20/21 0438 01/21/21 0425  BUN 32* 28* 24* 23* 21* 20 16 15 16 18   CREATININE 1.06* 0.92 1.02* 0.89 0.87 0.84 0.90 0.95 0.96 1.17*    -Continue monitoring and repeat labs in the morning.   Normocytic anemia: Stable. Recent Labs    01/12/21 0540 01/13/21 0648 01/14/21 0723 01/15/21 0256 01/16/21 0253 01/17/21 0305 01/18/21 0440 01/19/21 0442 01/20/21 0438 01/21/21 0425  HGB 11.0* 11.5* 11.1* 11.8* 10.9* 11.0* 11.4* 11.2* 9.8* 9.8*   -Continue monitoring -Folic acid supplementation  Chronic Back pain-tenderness over right lumbar paraspinal muscles.  No CVA tenderness -IV fentanyl, p.o. Robaxin, Tylenol, tramadol and heating pad -Encourage ambulation  Hyperglycemia without diagnosis of diabetes: A1c 5.3%.  Recent Labs  Lab 01/20/21 1939 01/20/21 2339 01/21/21 0438 01/21/21 0730 01/21/21 1156  GLUCAP 172* 141* 160* 134* 129*   -Continue SSI-moderate -Continue monitoring  Sinus tachycardia: HR about 100 for most part.  Could be due to pain and deconditioning.  TSH within normal. -Pain control as above -Ambulate patient.  Does not look anxious clinically but will continue as needed Xanax.  Hypokalemia: Resolved.  Pulmonary nodules?  CT chest in 11/2020 showed RLL GG airspace opacity measuring 1.3 cm. -Pulmonary follow-up and repeat CT in 6 to 12 months was recommended   Leukocytosis: Suspect demargination.  Repeat labs in the morning  History of depression and anxiety: Stable -Continue Lexapro.  -Low-dose Xanax as needed   Class II obesity Body mass index is 33.22 kg/m. Nutrition Problem: Increased nutrient needs Etiology: acute illness (severe acute pancreatitis) Signs/Symptoms: estimated needs Interventions: Tube feeding   DVT prophylaxis:  enoxaparin (LOVENOX) injection 40 mg Start: 01/08/21 1000  Code Status: Full code Family Communication: Patient and/or RN.  Updated patient's mother at bedside. Level of care: Med-Surg Status is: Inpatient  Remains inpatient appropriate because:Ongoing active pain requiring inpatient pain management, IV treatments appropriate due to intensity of  illness or inability to take PO, and Inpatient level of care appropriate due to severity of illness  Dispo:  Patient From: Home  Planned Disposition: Home  Medically stable for discharge: No         Consultants:  Gastroenterology   Sch Meds:  Scheduled Meds:  chlorhexidine  15 mL Mouth Rinse BID   Chlorhexidine Gluconate Cloth  6 each Topical Daily   enoxaparin (LOVENOX) injection  40 mg Subcutaneous Q24H   escitalopram  10 mg Oral Daily   feeding supplement (PROSource TF)  45 mL Per Tube BID   folic acid  1 mg Oral Daily   insulin aspart  0-15 Units Subcutaneous Q4H   lidocaine  1 patch Transdermal Q24H   mouth rinse  15 mL Mouth Rinse q12n4p   pantoprazole sodium  40 mg Per Tube BID   Continuous Infusions:  feeding supplement (KATE FARMS STANDARD 1.4) Stopped (01/21/21 0957)   PRN Meds:.acetaminophen (TYLENOL) oral liquid 160 mg/5 mL, ALPRAZolam, alum & mag hydroxide-simeth, doxylamine (Sleep), fentaNYL (SUBLIMAZE) injection, methocarbamol, ondansetron (ZOFRAN) IV, polyvinyl alcohol, prochlorperazine, simethicone, traMADol  Antimicrobials: Anti-infectives (From admission, onward)    None        I have personally reviewed the following labs and images: CBC: Recent Labs  Lab 01/17/21 0305 01/18/21  2202 01/19/21 0442 01/20/21 0438 01/21/21 0425  WBC 13.3* 16.4* 17.4* 14.3* 15.2*  HGB 11.0* 11.4* 11.2* 9.8* 9.8*  HCT 34.7* 35.8* 35.2* 30.5* 31.6*  MCV 91.6 89.1 90.0 90.8 92.1  PLT 196 267 326 339 359    BMP &GFR Recent Labs  Lab 01/14/21 1638 01/15/21 0256 01/17/21 0305 01/18/21 0440 01/19/21 0442 01/20/21 0438 01/21/21 0425  NA  --    < > 139 139 140 142 141  K  --    < > 3.8 3.6 3.5 3.5 4.0  CL  --    < > 97* 97* 99 100 102  CO2  --    < > 33* 32 33* 35* 32  GLUCOSE  --    < > 218* 215* 160* 170* 189*  BUN  --    < > 20 16 15 16 18   CREATININE  --    < > 0.84 0.90 0.95 0.96 1.17*  CALCIUM  --    < > 7.9* 8.0* 8.0* 7.8* 8.0*  MG 1.9  --   --    --   --  2.1 2.4  PHOS 2.5  --   --   --   --  5.7* 5.0*   < > = values in this interval not displayed.    Estimated Creatinine Clearance: 68.5 mL/min (A) (by C-G formula based on SCr of 1.17 mg/dL (H)). Liver & Pancreas: Recent Labs  Lab 01/17/21 0305 01/20/21 0438 01/21/21 0425  AST  --  46* 53*  ALT  --  64* 68*  ALKPHOS  --  185* 209*  BILITOT  --  0.4 0.7  PROT  --  5.2* 5.6*  ALBUMIN 1.9* 2.1* 2.3*    No results for input(s): LIPASE, AMYLASE in the last 168 hours.  No results for input(s): AMMONIA in the last 168 hours. Diabetic: No results for input(s): HGBA1C in the last 72 hours.  Recent Labs  Lab 01/20/21 1939 01/20/21 2339 01/21/21 0438 01/21/21 0730 01/21/21 1156  GLUCAP 172* 141* 160* 134* 129*    Cardiac Enzymes: No results for input(s): CKTOTAL, CKMB, CKMBINDEX, TROPONINI in the last 168 hours. No results for input(s): PROBNP in the last 8760 hours. Coagulation Profile: No results for input(s): INR, PROTIME in the last 168 hours. Thyroid Function Tests: No results for input(s): TSH, T4TOTAL, FREET4, T3FREE, THYROIDAB in the last 72 hours.  Lipid Profile: No results for input(s): CHOL, HDL, LDLCALC, TRIG, CHOLHDL, LDLDIRECT in the last 72 hours. Anemia Panel: Recent Labs    01/20/21 0438  VITAMINB12 715  FOLATE 5.3*  FERRITIN 300  TIBC 176*  IRON 26*  RETICCTPCT 1.9    Urine analysis:    Component Value Date/Time   COLORURINE AMBER (A) 09/22/2012 1625   APPEARANCEUR TURBID (A) 09/22/2012 1625   LABSPEC 1.034 (H) 09/22/2012 1625   PHURINE 5.0 09/22/2012 1625   GLUCOSEU NEGATIVE 09/22/2012 1625   HGBUR SMALL (A) 09/22/2012 1625   BILIRUBINUR SMALL (A) 09/22/2012 1625   KETONESUR 15 (A) 09/22/2012 1625   PROTEINUR NEGATIVE 09/22/2012 1625   UROBILINOGEN 0.2 09/22/2012 1625   NITRITE NEGATIVE 09/22/2012 1625   LEUKOCYTESUR SMALL (A) 09/22/2012 1625   Sepsis Labs: Invalid input(s): PROCALCITONIN,  [place]  Microbiology: Recent Results (from the past 240 hour(s))  MRSA PCR Screening     Status: None   Collection Time: 01/13/21 10:45 AM   Specimen: Nasal Mucosa; Nasopharyngeal  Result Value Ref Range Status   MRSA by PCR NEGATIVE NEGATIVE  Final    Comment:        The GeneXpert MRSA Assay (FDA approved for NASAL specimens only), is one component of a comprehensive MRSA colonization surveillance program. It is not intended to diagnose MRSA infection nor to guide or monitor treatment for MRSA infections. Performed at [facility], [place] [address], [place], [place] [address]     Radiology Studies: No results found.  Darliss Cheney, MD [facility]  If 7PM-7AM, please contact night-coverage [email or web address] 01/21/2021, 1:29 PM

## 2021-01-21 NOTE — TOC Progression Note (Signed)
Transition of Care [facility]) - Progression Note    Patient Details  Name: Kimberly Kelly MRN: [id number] Date of Birth: 11-10-1970  Transition of Care [facility]) CM/SW Contact  Purcell Mouton, RN Phone Number: 01/21/2021, 1:59 PM  Clinical Narrative:    Spoke with pt concerning discharge needs. At present time there are no HH needs. Pt do not feel that she will need HHPT.    Expected Discharge Plan: Home/Self Care Barriers to Discharge: No Barriers Identified  Expected Discharge Plan and Services Expected Discharge Plan: Home/Self Care   Discharge Planning Services: CM Consult Post Acute Care Choice: [place] arrangements for the past 2 months: Single Family Home                                       Social Determinants of Health (SDOH) Interventions    Readmission Risk Interventions No flowsheet data found.

## 2021-01-21 NOTE — Progress Notes (Signed)
NUTRITION NOTE  Able to talk with Dr. Cristina Gong on the phone. Plan is for TF to be held this evening by NGT to remain in place.   Communicated this with patient and requested her to write down everything she consumes between today at 1600 and tomorrow at 0900.   Will follow-up with patient in the AM to determine adequacy of PO intake.  Patient reports she is typically not a breakfast eater but will do her best in the morning knowing that it will help indicate readiness for NGT removal and d/c home.  Discussed low fat diet and incorporating protein at all meals.       Jarome Matin, MS, RD, LDN, CNSC Inpatient Clinical Dietitian RD pager # available in [place]  After hours/weekend pager # available in [facility]

## 2021-01-21 NOTE — Progress Notes (Signed)
Physical Therapy Treatment Patient Details Name: Kimberly Kelly MRN: [id number] DOB: Oct 29, 1970 Today's Date: 01/21/2021    History of Present Illness This 50 y.o. female admitted s/p ERCP with acute pancratitis.   PMH includes: anxiety, lung nodules, diverticulosis, s/p cholectystectomy, frozen shoulder Lt.    PT Comments    The patient practiced negotiating essentially 10 steps on PT rolling steps and tolerated well.  Patient does not require any HHPT at this time. Patient looking forward to Dc home soon.  Pt. Observed ambulating in hall with family prior to PT visit.   Follow Up Recommendations  No PT follow up     Equipment Recommendations  None recommended by PT    Recommendations for Other Services       Precautions / Restrictions Precautions Precautions: Fall    Mobility  Bed Mobility               General bed mobility comments: in recliner    Transfers     Transfers: Sit to/from Stand Sit to Stand: Supervision         General transfer comment: S for safety  Ambulation/Gait Ambulation/Gait assistance: Supervision Gait Distance (Feet): 40 Feet Assistive device:  (intermittently supported on objects around the room) Gait Pattern/deviations: Step-through pattern     General Gait Details: pt. observed ambulating in hall with family prior to PT   Stairs Stairs: Yes Stairs assistance: Supervision Stair Management: Two rails Number of Stairs: 10 General stair comments: performed 2 and 3 steps multiple times.   Wheelchair Mobility    Modified Rankin (Stroke Patients Only)       Balance Overall balance assessment: Needs assistance   Sitting balance-Leahy Scale: Good     Standing balance support: No upper extremity supported Standing balance-Leahy Scale: Fair                              Cognition Arousal/Alertness: Awake/alert                                            Exercises      General  Comments        Pertinent Vitals/Pain Faces Pain Scale: Hurts whole lot Pain Location: back radiating R flank Pain Descriptors / Indicators: Discomfort;Nagging;Guarding Pain Intervention(s): Monitored during session;Premedicated before session    Home Living                      Prior Function            PT Goals (current goals can now be found in the care plan section)      Frequency    Min 3X/week      PT Plan Discharge plan needs to be updated    Co-evaluation              AM-PAC PT "6 Clicks" Mobility   Outcome Measure  Help needed turning from your back to your side while in a flat bed without using bedrails?: None Help needed moving from lying on your back to sitting on the side of a flat bed without using bedrails?: None Help needed moving to and from a bed to a chair (including a wheelchair)?: None Help needed standing up from a chair using your arms (e.g., wheelchair or bedside chair)?: None Help needed to walk  in hospital room?: A Little Help needed climbing 3-5 steps with a railing? : A Little 6 Click Score: 22    End of Session   Activity Tolerance: Patient tolerated treatment well Patient left: in chair;with call bell/phone within reach;with family/visitor present Nurse Communication: Mobility status Pain - Right/Left: Right Pain - part of body: Hip     Time: 1135-1145 PT Time Calculation (min) (ACUTE ONLY): 10 min  Charges:  $Gait Training: 8-22 mins                     Kimberly Kelly PT Acute Rehabilitation Services Pager [phone] Office [phone]    Kimberly Kelly, Kimberly Kelly 01/21/2021, 1:43 PM

## 2021-01-21 NOTE — Progress Notes (Signed)
Patient continues to do reasonably well.  She is eating more and tolerating quite well, without worsened abdominal pain, nausea, or vomiting.  She feels that the switch to this nocturnal cycled tube feedings is helping her consume more oral nutrition.  She still has smoldering abdominal pain but pain controlled with oral medications seems to be adequate and the patient is desirous of going home.  Physical therapy note reviewed.  It looks like the patient is physically capable of managing at home, including her stairs.  Labs are not quite as good today.  Creatinine has crept up a bit, white count has crept back up a bit.  Exam: The patient is sitting up on the side of the bed, appears very chipper, no distress.  No pallor, no icterus.  The abdomen is mildly distended and mildly tympanitic, rather diffusely but especially in the lower abdomen and on the right side.  Bowel sounds are present.  There is definite epigastric tenderness and a mild degree of fullness, consistent with possible residual intra-abdominal edema.  Impression: Smoldering residual inflammation from pancreatitis, but adequate tolerance of oral nutrition.  Recommendations:  1.  I think discharge home today or even more so tomorrow, is reasonable.  The patient and her mother who is at the bedside and I all agree that there might be a brief transition phase when she first goes home, where she has a transient decrement in oral nutritional intake, but overall, we do not feel that this would be problematic since her baseline nutritional status is pretty good and she is not precariously malnourished.  2.  She should have early follow-up with her gastroenterologist, Dr. Earlie Raveling.  The patient expressed an interest in following with our group, which might be reasonable after Dr. Romeo Rabon which apparently will be occurring sometime in the not too distant future, but in the meantime, I think it is most appropriate for him to follow-up  for this patient's pancreatitis since he was seen her preprocedurally for her biliary symptoms.  3.  No new recommendations.  Please feel free to call to discuss this patient's care.  One option would be to not administer nighttime feedings tonight to see how the patient does strictly on her own oral intake.  The feeding tube could be left in for the time being, in case the patient does not do as well as we suspect she will.  Cleotis Nipper, M.D. Pager [phone] If no answer or after 5 PM call [phone]

## 2021-01-22 DIAGNOSIS — K858 Other acute pancreatitis without necrosis or infection: Secondary | ICD-10-CM | POA: Diagnosis not present

## 2021-01-22 LAB — COMPREHENSIVE METABOLIC PANEL
ALT: 56 U/L — ABNORMAL HIGH (ref 0–44)
AST: 34 U/L (ref 15–41)
Albumin: 2.5 g/dL — ABNORMAL LOW (ref 3.5–5.0)
Alkaline Phosphatase: 196 U/L — ABNORMAL HIGH (ref 38–126)
Anion gap: 8 (ref 5–15)
BUN: 16 mg/dL (ref 6–20)
CO2: 30 mmol/L (ref 22–32)
Calcium: 8.4 mg/dL — ABNORMAL LOW (ref 8.9–10.3)
Chloride: 102 mmol/L (ref 98–111)
Creatinine, Ser: 1.18 mg/dL — ABNORMAL HIGH (ref 0.44–1.00)
GFR, Estimated: 56 mL/min — ABNORMAL LOW (ref >60)
Glucose, Bld: 142 mg/dL — ABNORMAL HIGH (ref 70–99)
Potassium: 4.3 mmol/L (ref 3.5–5.1)
Sodium: 140 mmol/L (ref 135–145)
Total Bilirubin: 0.6 mg/dL (ref 0.3–1.2)
Total Protein: 6 g/dL — ABNORMAL LOW (ref 6.5–8.1)

## 2021-01-22 LAB — CBC WITH DIFFERENTIAL/PLATELET
Abs Immature Granulocytes: 0.4 10*3/uL — ABNORMAL HIGH (ref 0.00–0.07)
Basophils Absolute: 0.1 10*3/uL (ref 0.0–0.1)
Basophils Relative: 0 %
Eosinophils Absolute: 0.3 10*3/uL (ref 0.0–0.5)
Eosinophils Relative: 2 %
HCT: 31.7 % — ABNORMAL LOW (ref 36.0–46.0)
Hemoglobin: 10 g/dL — ABNORMAL LOW (ref 12.0–15.0)
Immature Granulocytes: 3 %
Lymphocytes Relative: 9 %
Lymphs Abs: 1.4 10*3/uL (ref 0.7–4.0)
MCH: 28.6 pg (ref 26.0–34.0)
MCHC: 31.5 g/dL (ref 30.0–36.0)
MCV: 90.6 fL (ref 80.0–100.0)
Monocytes Absolute: 0.9 10*3/uL (ref 0.1–1.0)
Monocytes Relative: 6 %
Neutro Abs: 12 10*3/uL — ABNORMAL HIGH (ref 1.7–7.7)
Neutrophils Relative %: 80 %
Platelets: 411 10*3/uL — ABNORMAL HIGH (ref 150–400)
RBC: 3.5 MIL/uL — ABNORMAL LOW (ref 3.87–5.11)
RDW: 14.2 % (ref 11.5–15.5)
WBC: 14.9 10*3/uL — ABNORMAL HIGH (ref 4.0–10.5)
nRBC: 0 % (ref 0.0–0.2)

## 2021-01-22 LAB — GLUCOSE, CAPILLARY: Glucose-Capillary: 133 mg/dL — ABNORMAL HIGH (ref 70–99)

## 2021-01-22 MED ORDER — SODIUM CHLORIDE 0.9 % IV BOLUS
1000.0000 mL | Freq: Once | INTRAVENOUS | Status: AC
  Administered 2021-01-22: 1000 mL via INTRAVENOUS

## 2021-01-22 MED ORDER — ONDANSETRON 4 MG PO TBDP: 4.0000 mg | ORAL_TABLET | Freq: Three times a day (TID) | ORAL | 0 refills | Status: DC | PRN

## 2021-01-22 MED ORDER — PANTOPRAZOLE SODIUM 40 MG PO PACK
40.0000 mg | PACK | Freq: Two times a day (BID) | ORAL | Status: DC
  Filled 2021-01-22: qty 20

## 2021-01-22 MED ORDER — OMEPRAZOLE 40 MG PO CPDR: 40.0000 mg | DELAYED_RELEASE_CAPSULE | Freq: Every day | ORAL | 0 refills | Status: DC

## 2021-01-22 MED ORDER — TRAMADOL HCL 50 MG PO TABS: 50.0000 mg | ORAL_TABLET | Freq: Two times a day (BID) | ORAL | 0 refills | Status: AC | PRN

## 2021-01-22 MED ORDER — ONDANSETRON HCL 4 MG PO TABS
4.0000 mg | ORAL_TABLET | Freq: Once | ORAL | Status: AC
  Administered 2021-01-22: 4 mg via ORAL
  Filled 2021-01-22: qty 1

## 2021-01-22 NOTE — Progress Notes (Signed)
OT Cancellation Note  Patient Details Name: NATALYA DOMZALSKI MRN: [id number] DOB: [birth year]   Cancelled Treatment:    Reason Eval/Treat Not Completed: Patient declined, reports recently walked in hallway and is tired "I didn't sleep much last night" is hoping to speak with dietician at [facility], get NG out and "go home!" Patient appreciative however declines any OT needs at this time.   Delbert Phenix OT OT pager: [phone]   Rosemary Holms 01/22/2021, 8:48 AM

## 2021-01-22 NOTE — Progress Notes (Signed)
NUTRITION NOTE  Spoke with patient and her husband this AM. TF was held last night. Patient reports it was a rough night because she was up very frequently to urinate.  Patient ate chicken salad and fruit salad for dinner last night. For breakfast this AM she had cereal with milk, a banana, a few sips of orange juice, and is now sipping on apple juice.   Communicated with Dr. Cristina Gong and Dr. Doristine Bosworth that from a nutrition stand point NGT can be removed and that from a nutrition stand point risks outweigh the benefit of patient remaining hospitalized.        Jarome Matin, MS, RD, LDN, CNSC Inpatient Clinical Dietitian RD pager # available in [place]  After hours/weekend pager # available in [facility]

## 2021-01-22 NOTE — TOC Transition Note (Signed)
Transition of Care [facility]) - CM/SW Discharge Note   Patient Details  Name: Kimberly Kelly MRN: [id number] Date of Birth: 09-17-1970  Transition of Care [facility]) CM/SW Contact:  Ross Ludwig, LCSW Phone Number: 01/22/2021, 11:21 AM   Clinical Narrative:     Patient will be going home she does not feel she needs any home health.  CSW signing off please reconsult with any other social work needs.    Final next level of care: Home/Self Care Barriers to Discharge: Barriers Resolved   Patient Goals and CMS Choice Patient states their goals for this hospitalization and ongoing recovery are:: To return back home CMS Medicare.gov Compare Post Acute Care list provided to:: Patient Choice offered to / list presented to : Patient  Discharge Placement  Home self care.                     Discharge Plan and Services   Discharge Planning Services: CM Consult Post Acute Care Choice: Home Health                               Social Determinants of Health (SDOH) Interventions     Readmission Risk Interventions No flowsheet data found.

## 2021-01-22 NOTE — Progress Notes (Signed)
[facility] Gastroenterology Progress Note  Kimberly Kelly 50 y.o. September 08, 1970  CC:  Severe acute post-ERCP pancreatitis  Subjective: Patient reports improvement in abdominal pain.  She is tolerating a diet without nausea or vomiting, though notes early satiety.  She does note 2 episodes of loose stool today, though previously was having formed stools.  ROS : Review of Systems  Respiratory:  Negative for cough and shortness of breath.   Gastrointestinal:  Positive for abdominal pain and diarrhea. Negative for blood in stool, constipation, heartburn, melena, nausea and vomiting.    Objective: Vital signs in last 24 hours: Vitals:   01/21/21 2101 01/22/21 0559  BP: (!) 139/94 (!) 140/94  Pulse: (!) 108 (!) 102  Resp: 18 18  Temp: 98.3 F (36.8 C) 98.3 F (36.8 C)  SpO2: 96% 94%    Physical Exam:  General:  Alert, oriented, cooperative, no acute distress  Head:  Normocephalic, without obvious abnormality, atraumatic  Eyes:  Anicteric sclera, EOMs intact  Lungs:   Breathing comfortably on room air  Heart:  Mildly tachycardic  Abdomen:   Soft and non-distended with mild diffuse tenderness to palpation. Normoactive bowel sounds.  Extremities: Extremities normal, atraumatic, no  edema    Lab Results: Recent Labs    01/20/21 0438 01/21/21 0425 01/22/21 0340  NA 142 141 140  K 3.5 4.0 4.3  CL 100 102 102  CO2 35* 32 30  GLUCOSE 170* 189* 142*  BUN 16 18 16   CREATININE 0.96 1.17* 1.18*  CALCIUM 7.8* 8.0* 8.4*  MG 2.1 2.4  --   PHOS 5.7* 5.0*  --     Recent Labs    01/21/21 0425 01/22/21 0340  AST 53* 34  ALT 68* 56*  ALKPHOS 209* 196*  BILITOT 0.7 0.6  PROT 5.6* 6.0*  ALBUMIN 2.3* 2.5*    Recent Labs    01/21/21 0425 01/22/21 0340  WBC 15.2* 14.9*  NEUTROABS  --  12.0*  HGB 9.8* 10.0*  HCT 31.6* 31.7*  MCV 92.1 90.6  PLT 359 411*    No results for input(s): LABPROT, INR in the last 72 hours.  Assessment: Severe acute post-ERCP pancreatitis, clinically  improving -WBCs 14.9, stable -BUN 16/ Cr 1.18  Plan: OK to discharge from a GI standpoint.  Patient would like to establish care with [facility]/Dr. Therisa Doyne, as she followed the patient throughout most of this hospitalization.  Plan for repeat labs within the next 1-2 weeks, and FU has been arranged for 7/12.    Patient advised to call our office or seek care sooner if she experiences any concerning symptoms including but not limited to worsening pain, inability to maintain proper PO intake, or fever. She verbalized understanding.  [facility] will sign off.  Please contact us if we can be of any further assistance during this hospital stay.  Salley Slaughter PA-C 01/22/2021, 11:27 AM  Contact #  [phone]

## 2021-01-22 NOTE — Discharge Summary (Addendum)
Physician Discharge Summary  Kimberly Kelly ACZ:[id number] DOB: 17-Nov-1970 DOA: 01/07/2021  PCP: [name], FNP  Admit date: 01/07/2021 Discharge date: 01/22/2021 30 Day Unplanned Readmission Risk Score    Flowsheet Row ED to Hosp-Admission (Current) from 01/07/2021 in [facility]  30 Day Unplanned Readmission Risk Score (%) 19.77 Filed at 01/22/2021 0801       This score is the patient's risk of an unplanned readmission within 30 days of being discharged (0 -100%). The score is based on dignosis, age, lab data, medications, orders, and past utilization.   Low:  0-14.9   Medium: 15-21.9   High: 22-29.9   Extreme: 30 and above           Admitted From: Home Disposition: Home  Recommendations for Outpatient Follow-up:  Follow up with PCP in 1-2 weeks Follow-up with GI in 2 to 4 weeks Please follow-up with pulmonology and repeat CT chest in 6 to 12 months due to pulmonary nodules. Please obtain BMP/CBC in one week Please follow up with your PCP on the following pending results: Unresulted Labs (From admission, onward)    None         Home Health: None Equipment/Devices: None  Discharge Condition: Stable CODE STATUS: Full code Diet recommendation: Cardiac  Subjective: Seen and examined.  Pain improved.  No nausea or vomiting.  She claimed that she has tolerated and consumed at least 70 to 80% of the meal since yesterday and wants her NG tube removed before discharge.  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  GI was consulted.  Due to her pain, she remained n.p.o. and she was started on tube feedings. Had markedly elevated lipase. Lipase normalized.  She was then  started on clear liquid diet and advance to low-fat diet which she has tolerated very well.  Her tube feedings were held last night.  She was seen by nutritionist per request by GI and per nutrition, she has consumed enough of the calories since yesterday that they have now recommended to remove the NG tube as she does not recommend any more NG tube feedings.  Patient also wants that.  Patient is being discharged today in stable condition.  She understands that she will continue to have some sort of abdominal pain for couple of weeks before it resolves.  She will follow-up with PCP and GI.   Diarrhea: Likely due to Reglan and TF. Low suspicion for C. difficile as diarrhea and abdominal pain improved.  Leukocytosis stable.  Diarrhea improved with change of tube feeding formula.   Acute respiratory failure with hypoxia-likely due to atelectasis and possibly due to pleural effusion: Likely due to pancreatitis.  Diuresed with IV Lasix and had about 5 L UOP.  BNP within normal.  Edema improved.  CXR with moderate bilateral pleural effusions with slight improvement   Elevated liver enzymes: Likely due to #1 Last Labs        Recent Labs  Lab 01/17/21 0305 01/20/21 0438 01/21/21 0425  AST  -- 46* 53*  ALT  -- 64* 68*  ALKPHOS  -- 185* 209*  BILITOT  -- 0.4 0.7  PROT  -- 5.2* 5.6*  ALBUMIN 1.9* 2.1* 2.3*        AKI/azotemia: Had  resolved earlier but some jump in creatinine yesterday and today.  I encouraged her to drink more fluids yesterday but creatinine remained stable.  She will receive 1 L of normal saline IV fluid bolus before discharge today.   Normocytic anemia: Stable. Recent Labs (within last 365 days)              Recent Labs    01/12/21 0540 01/13/21 0648 01/14/21 0723 01/15/21 0256 01/16/21 0253 01/17/21 0305 01/18/21 0440 01/19/21 0442 01/20/21 0438 01/21/21 0425  HGB 11.0* 11.5* 11.1* 11.8* 10.9* 11.0* 11.4* 11.2* 9.8* 9.8*       Hyperglycemia without diagnosis of  diabetes: A1c 5.3%.  Last Labs          Recent Labs  Lab 01/20/21 1939 01/20/21 2339 01/21/21 0438 01/21/21 0730 01/21/21 1156  GLUCAP 172* 141* 160* 134* 129*     This was managed with SSI here.   Sinus tachycardia: HR about 100 for most part.  Could be due to pain and deconditioning.  TSH within normal. -Pain control as above -Ambulate patient.  Does not look anxious clinically.   Hypokalemia: Resolved.   Pulmonary nodules?  CT chest in 11/2020 showed RLL GG airspace opacity measuring 1.3 cm. -Pulmonary follow-up and repeat CT in 6 to 12 months was recommended   Leukocytosis: Suspect demargination.  No signs of infection.  Leukocytosis stable.   History of depression and anxiety: Stable -Continue Lexapro. Discharge Diagnoses:  Principal Problem:   Acute pancreatitis Active Problems:   S/P ERCP   Acute urinary retention   Hypokalemia   Thrombocytosis   Leukocytosis   Bradycardia   Depression   Anxiety    Discharge Instructions   Allergies as of 01/22/2021   No Known Allergies      Medication List     TAKE these medications    escitalopram 10 MG tablet Commonly known as: LEXAPRO Take 10 mg by mouth daily.   omeprazole 40 MG capsule Commonly known as: PRILOSEC Take 1 capsule (40 mg total) by mouth daily.   ondansetron 4 MG disintegrating tablet Commonly known as: Zofran ODT Take 1 tablet (4 mg total) by mouth every 8 (eight) hours as needed for nausea or vomiting.   traMADol 50 MG tablet Commonly known as: ULTRAM Take 1 tablet (50 mg total) by mouth every 12 (twelve) hours as needed for up to 7 days for moderate pain. What changed:  when to take this reasons to take this         Follow-up Information     [name], FNP Follow up in 1 week(s).   Specialty: Nurse Practitioner Contact information: [place] [id number] [phone]                No Known Allergies  Consultations:  GI   Procedures/Studies: DG Abd 1 View  Result Date: 01/17/2021 CLINICAL DATA:  NG tube placement EXAM: ABDOMEN - 1 VIEW COMPARISON:  01/17/2021 FINDINGS: Esophageal tube has been retracted, the tip now overlies the mid stomach. Left greater than right pleural effusions with basilar airspace disease. IMPRESSION: 1. Esophageal tube tip now overlies the mid gastric region 2. Bilateral pleural effusions and basilar airspace disease. Electronically Signed   By: Donavan Foil M.D.   On: 01/17/2021 15:51   DG Abd 1 View  Result Date: 01/13/2021 CLINICAL DATA:  Check Dobbhoff placement EXAM: ABDOMEN - 1 VIEW COMPARISON:  None. FINDINGS: Weighted feeding catheter is noted in the second portion of  the duodenum. Scattered large and small bowel gas is noted. IMPRESSION: Dobbhoff catheter within the second portion of the duodenum. Electronically Signed   By: Inez Catalina M.D.   On: 01/13/2021 15:15   CT ABDOMEN PELVIS W CONTRAST  Result Date: 01/07/2021 CLINICAL DATA:  ERCP earlier today, now with nausea and vomiting and acute abdominal pain. EXAM: CT ABDOMEN AND PELVIS WITH CONTRAST TECHNIQUE: Multidetector CT imaging of the abdomen and pelvis was performed using the standard protocol following bolus administration of intravenous contrast. CONTRAST:  19mL OMNIPAQUE IOHEXOL 300 MG/ML  SOLN COMPARISON:  None FINDINGS: Lower chest: Mild bibasilar atelectasis. Impacted airway noted at the left lung base. The heart and pericardium are unremarkable. Hepatobiliary: No focal liver abnormality is seen. Status post cholecystectomy. No biliary dilatation. Pancreas: There is extensive peripancreatic inflammatory change and edema extending into the anterior pararenal spaces bilaterally. There is mild ascites present. The inflammatory changes encompass the entire pancreas. The pancreatic parenchyma diffusely demonstrates mild hypoenhancement with more focal hypoenhancement involving the head and uncinate process. No areas of  frank non enhancement are identified, however, to suggest pancreatic necrosis. The pancreatic duct is not dilated. No pancreatic parenchymal calcifications. No loculated peripancreatic fluid collections are identified. Altogether, the findings are in keeping with moderate acute interstitial/edematous pancreatitis. Spleen: Unremarkable Adrenals/Urinary Tract: Adrenal glands are unremarkable. Kidneys are normal, without renal calculi, focal lesion, or hydronephrosis. Bladder is unremarkable. Stomach/Bowel: Stomach is within normal limits. Appendix appears normal. No evidence of bowel wall thickening, distention, or inflammatory changes. No free intraperitoneal gas. Vascular/Lymphatic: The abdominal vasculature is unremarkable. Specifically, the splenic vein, superior mesenteric vein, and portal vein are patent. The celiac axis and proximal superior mesenteric artery are unremarkable. Circumaortic left renal vein is noted. No pathologic adenopathy within the abdomen and pelvis. Reproductive: Uterus and bilateral adnexa are unremarkable. Other: No abdominal wall hernia.  Rectum unremarkable. Musculoskeletal: No acute bone abnormality. IMPRESSION: Moderate, acute, edematous/interstitial pancreatitis. Mild ascites. Note there are areas of relative hypoenhancement within the head and uncinate process of the pancreas without frank necrosis noted at this time. Follow-up evaluation may be warranted to ensure continued viability of the pancreatic parenchyma. Electronically Signed   By: Fidela Salisbury MD   On: 01/07/2021 22:37   DG CHEST PORT 1 VIEW  Result Date: 01/17/2021 CLINICAL DATA:  Pleural effusions.  Pancreatitis. EXAM: PORTABLE CHEST 1 VIEW COMPARISON:  January 13, 2021. FINDINGS: Moderate bilateral pleural effusions, likely slightly improved. Overlying bibasilar opacities. No visible pneumothorax on this semi erect radiograph. Patient is rotated to the right with right paratracheal lucency representing the trachea.  Enteric tube courses below the diaphragm in outside the field of view. Cardiac silhouette is largely obscured, but likely similar to prior. IMPRESSION: 1. Moderate bilateral pleural effusions, likely slightly improved. 2. Overlying bibasilar opacities, which could represent compressive atelectasis and/or pneumonia. Electronically Signed   By: Margaretha Sheffield MD   On: 01/17/2021 11:19   DG CHEST PORT 1 VIEW  Result Date: 01/13/2021 CLINICAL DATA:  Shortness of breath EXAM: PORTABLE CHEST 1 VIEW COMPARISON:  January 09, 2021 FINDINGS: There are pleural effusions bilaterally with atelectasis and patchy infiltrate in each lower lung region. Heart is borderline enlarged with pulmonary vascularity normal. No evident adenopathy. No pneumothorax. No bone lesions. IMPRESSION: Pleural effusions bilaterally. Atelectasis and questionable superimposed pneumonia in the lower lung regions. Stable cardiac prominence. Electronically Signed   By: Lowella Grip III M.D.   On: 01/13/2021 09:23   DG CHEST PORT 1 VIEW  Result  Date: 01/09/2021 CLINICAL DATA:  Hypoxia, dyspnea EXAM: PORTABLE CHEST 1 VIEW COMPARISON:  10/10/2016 FINDINGS: Small to moderate bilateral pleural effusions have developed with associated bibasilar atelectasis. No superimposed focal pulmonary infiltrate. No pneumothorax. Cardiac size is within normal limits. Right suprahilar opacity likely relates to vascular shadow accentuated by semi-erect positioning. Pulmonary vascularity is normal. No acute bone abnormality. IMPRESSION: Interval development of small to moderate bilateral pleural effusions and associated bibasilar compressive atelectasis. Electronically Signed   By: Fidela Salisbury MD   On: 01/09/2021 21:24   DG Abd Portable 1V  Result Date: 01/19/2021 CLINICAL DATA:  Enteric catheter placement EXAM: PORTABLE ABDOMEN - 1 VIEW COMPARISON:  01/17/2021 at 3:26 p.m. FINDINGS: Frontal view of the abdomen and pelvis was performed, excluding the  hemidiaphragms and lower pelvis by collimation. The weighted tip of an enteric feeding catheter overlies the gastric antrum/pyloric region. Paucity of bowel gas. No masses or abnormal calcifications. IMPRESSION: 1. Enteric catheter weighted tip overlying the gastric antrum/pylorus. Electronically Signed   By: Randa Ngo M.D.   On: 01/19/2021 00:50   DG Abd Portable 2V  Result Date: 01/17/2021 CLINICAL DATA:  Abdominal bloating.  Pleural effusions. EXAM: PORTABLE ABDOMEN - 2 VIEW COMPARISON:  January 13, 2021. FINDINGS: Dobbhoff catheter with tip in similar position, likely in the proximal duodenum. Right upper quadrant clips. Nonobstructive bowel gas pattern with gas in small bowel and colon. No dilated gas-filled bowel loops identified. No obvious free air on these supine/semi erect radiographs. No abnormal calcifications. Chest is further evaluated on concurrent chest radiograph. IMPRESSION: 1. Dobbhoff catheter with tip in similar position, likely in the proximal duodenum. 2. Nonobstructive bowel gas pattern. 3. Chest is further evaluated on concurrent chest radiograph. Electronically Signed   By: Margaretha Sheffield MD   On: 01/17/2021 11:16     Discharge Exam: Vitals:   01/21/21 2101 01/22/21 0559  BP: (!) 139/94 (!) 140/94  Pulse: (!) 108 (!) 102  Resp: 18 18  Temp: 98.3 F (36.8 C) 98.3 F (36.8 C)  SpO2: 96% 94%   Vitals:   01/21/21 1312 01/21/21 2101 01/22/21 0500 01/22/21 0559  BP:  (!) 139/94  (!) 140/94  Pulse: (!) 104 (!) 108  (!) 102  Resp:  18  18  Temp:  98.3 F (36.8 C)  98.3 F (36.8 C)  TempSrc:  Oral  Oral  SpO2:  96%  94%  Weight:   94.8 kg   Height:        General: Pt is alert, awake, not in acute distress Cardiovascular: RRR, S1/S2 +, no rubs, no gallops Respiratory: CTA bilaterally, no wheezing, no rhonchi Abdominal: Soft, NT, very mild epigastric tenderness, bowel sounds + Extremities: no edema, no cyanosis    The results of significant diagnostics from  this hospitalization (including imaging, microbiology, ancillary and laboratory) are listed below for reference.     Microbiology: Recent Results (from the past 240 hour(s))  MRSA PCR Screening     Status: None   Collection Time: 01/13/21 10:45 AM   Specimen: Nasal Mucosa; Nasopharyngeal  Result Value Ref Range Status   MRSA by PCR NEGATIVE NEGATIVE Final    Comment:        The GeneXpert MRSA Assay (FDA approved for NASAL specimens only), is one component of a comprehensive MRSA colonization surveillance program. It is not intended to diagnose MRSA infection nor to guide or monitor treatment for MRSA infections. Performed at [facility], [place] [address], [place], [place] [address]  Labs: BNP (last 3 results) Recent Labs    01/17/21 0305  BNP 10.1   Basic Metabolic Panel: Recent Labs  Lab 01/18/21 0440 01/19/21 0442 01/20/21 0438 01/21/21 0425 01/22/21 0340  NA 139 140 142 141 140  K 3.6 3.5 3.5 4.0 4.3  CL 97* 99 100 102 102  CO2 32 33* 35* 32 30  GLUCOSE 215* 160* 170* 189* 142*  BUN 16 15 16 18 16   CREATININE 0.90 0.95 0.96 1.17* 1.18*  CALCIUM 8.0* 8.0* 7.8* 8.0* 8.4*  MG  --   --  2.1 2.4  --   PHOS  --   --  5.7* 5.0*  --    Liver Function Tests: Recent Labs  Lab 01/17/21 0305 01/20/21 0438 01/21/21 0425 01/22/21 0340  AST  --  46* 53* 34  ALT  --  64* 68* 56*  ALKPHOS  --  185* 209* 196*  BILITOT  --  0.4 0.7 0.6  PROT  --  5.2* 5.6* 6.0*  ALBUMIN 1.9* 2.1* 2.3* 2.5*   No results for input(s): LIPASE, AMYLASE in the last 168 hours. No results for input(s): AMMONIA in the last 168 hours. CBC: Recent Labs  Lab 01/18/21 0440 01/19/21 0442 01/20/21 0438 01/21/21 0425 01/22/21 0340  WBC 16.4* 17.4* 14.3* 15.2* 14.9*  NEUTROABS  --   --   --   --  12.0*  HGB 11.4* 11.2* 9.8* 9.8* 10.0*  HCT 35.8* 35.2* 30.5* 31.6* 31.7*  MCV 89.1 90.0 90.8 92.1 90.6  PLT 267 326 339 359 411*   Cardiac Enzymes: No results for  input(s): CKTOTAL, CKMB, CKMBINDEX, TROPONINI in the last 168 hours. BNP: Invalid input(s): POCBNP CBG: Recent Labs  Lab 01/21/21 0730 01/21/21 1156 01/21/21 1716 01/21/21 2054 01/22/21 0707  GLUCAP 134* 129* 121* 140* 133*   D-Dimer No results for input(s): DDIMER in the last 72 hours. Hgb A1c No results for input(s): HGBA1C in the last 72 hours. Lipid Profile No results for input(s): CHOL, HDL, LDLCALC, TRIG, CHOLHDL, LDLDIRECT in the last 72 hours. Thyroid function studies No results for input(s): TSH, T4TOTAL, T3FREE, THYROIDAB in the last 72 hours.  Invalid input(s): FREET3 Anemia work up Recent Labs    01/20/21 0438  VITAMINB12 715  FOLATE 5.3*  FERRITIN 300  TIBC 176*  IRON 26*  RETICCTPCT 1.9   Urinalysis    Component Value Date/Time   COLORURINE AMBER (A) 09/22/2012 1625   APPEARANCEUR TURBID (A) 09/22/2012 1625   LABSPEC 1.034 (H) 09/22/2012 1625   PHURINE 5.0 09/22/2012 1625   GLUCOSEU NEGATIVE 09/22/2012 1625   HGBUR SMALL (A) 09/22/2012 1625   BILIRUBINUR SMALL (A) 09/22/2012 1625   KETONESUR 15 (A) 09/22/2012 1625   PROTEINUR NEGATIVE 09/22/2012 1625   UROBILINOGEN 0.2 09/22/2012 1625   NITRITE NEGATIVE 09/22/2012 1625   LEUKOCYTESUR SMALL (A) 09/22/2012 1625   Sepsis Labs Invalid input(s): PROCALCITONIN,  WBC,  LACTICIDVEN Microbiology Recent Results (from the past 240 hour(s))  MRSA PCR Screening     Status: None   Collection Time: 01/13/21 10:45 AM   Specimen: Nasal Mucosa; Nasopharyngeal  Result Value Ref Range Status   MRSA by PCR NEGATIVE NEGATIVE Final    Comment:        The GeneXpert MRSA Assay (FDA approved for NASAL specimens only), is one component of a comprehensive MRSA colonization surveillance program. It is not intended to diagnose MRSA infection nor to guide or monitor treatment for MRSA infections. Performed at [facility], [address] [place], [place] [address]      Time coordinating  discharge: Over 30 minutes  SIGNED:   Darliss Cheney, MD  [facility] 01/22/2021, 11:51 AM  If 7PM-7AM, please contact night-coverage [email or web address]

## 2021-01-30 ENCOUNTER — Other Ambulatory Visit: Payer: Self-pay | Admitting: Physician Assistant

## 2021-01-30 DIAGNOSIS — K859 Acute pancreatitis without necrosis or infection, unspecified: Secondary | ICD-10-CM

## 2021-01-31 ENCOUNTER — Other Ambulatory Visit: Payer: Self-pay

## 2021-01-31 ENCOUNTER — Inpatient Hospital Stay (HOSPITAL_BASED_OUTPATIENT_CLINIC_OR_DEPARTMENT_OTHER)
Admission: EM | Admit: 2021-01-31 | Discharge: 2021-02-11 | DRG: 871 | Disposition: A | Discharge status: Other Acute Inpatient Hospital | Payer: BC Managed Care – PPO | Attending: Internal Medicine | Admitting: Internal Medicine

## 2021-01-31 ENCOUNTER — Emergency Department (HOSPITAL_BASED_OUTPATIENT_CLINIC_OR_DEPARTMENT_OTHER): Payer: BC Managed Care – PPO

## 2021-01-31 DIAGNOSIS — F32A Depression, unspecified: Secondary | ICD-10-CM | POA: Diagnosis present

## 2021-01-31 DIAGNOSIS — M1990 Unspecified osteoarthritis, unspecified site: Secondary | ICD-10-CM | POA: Diagnosis present

## 2021-01-31 DIAGNOSIS — K8581 Other acute pancreatitis with uninfected necrosis: Secondary | ICD-10-CM | POA: Diagnosis not present

## 2021-01-31 DIAGNOSIS — K859 Acute pancreatitis without necrosis or infection, unspecified: Secondary | ICD-10-CM | POA: Diagnosis present

## 2021-01-31 DIAGNOSIS — Z9049 Acquired absence of other specified parts of digestive tract: Secondary | ICD-10-CM

## 2021-01-31 DIAGNOSIS — E860 Dehydration: Secondary | ICD-10-CM | POA: Diagnosis not present

## 2021-01-31 DIAGNOSIS — K2900 Acute gastritis without bleeding: Secondary | ICD-10-CM | POA: Diagnosis present

## 2021-01-31 DIAGNOSIS — I81 Portal vein thrombosis: Secondary | ICD-10-CM | POA: Diagnosis present

## 2021-01-31 DIAGNOSIS — K2100 Gastro-esophageal reflux disease with esophagitis, without bleeding: Secondary | ICD-10-CM | POA: Diagnosis present

## 2021-01-31 DIAGNOSIS — A419 Sepsis, unspecified organism: Principal | ICD-10-CM | POA: Diagnosis present

## 2021-01-31 DIAGNOSIS — K862 Cyst of pancreas: Secondary | ICD-10-CM | POA: Diagnosis present

## 2021-01-31 DIAGNOSIS — K863 Pseudocyst of pancreas: Secondary | ICD-10-CM | POA: Diagnosis present

## 2021-01-31 DIAGNOSIS — R739 Hyperglycemia, unspecified: Secondary | ICD-10-CM | POA: Diagnosis present

## 2021-01-31 DIAGNOSIS — R918 Other nonspecific abnormal finding of lung field: Secondary | ICD-10-CM | POA: Diagnosis present

## 2021-01-31 DIAGNOSIS — K8591 Acute pancreatitis with uninfected necrosis, unspecified: Secondary | ICD-10-CM | POA: Diagnosis present

## 2021-01-31 DIAGNOSIS — F419 Anxiety disorder, unspecified: Secondary | ICD-10-CM | POA: Diagnosis present

## 2021-01-31 DIAGNOSIS — R112 Nausea with vomiting, unspecified: Secondary | ICD-10-CM

## 2021-01-31 DIAGNOSIS — N390 Urinary tract infection, site not specified: Secondary | ICD-10-CM | POA: Diagnosis present

## 2021-01-31 DIAGNOSIS — D62 Acute posthemorrhagic anemia: Secondary | ICD-10-CM | POA: Diagnosis present

## 2021-01-31 DIAGNOSIS — R7401 Elevation of levels of liver transaminase levels: Secondary | ICD-10-CM | POA: Diagnosis present

## 2021-01-31 DIAGNOSIS — J90 Pleural effusion, not elsewhere classified: Secondary | ICD-10-CM

## 2021-01-31 DIAGNOSIS — K224 Dyskinesia of esophagus: Secondary | ICD-10-CM | POA: Diagnosis present

## 2021-01-31 DIAGNOSIS — Z20822 Contact with and (suspected) exposure to covid-19: Secondary | ICD-10-CM | POA: Diagnosis present

## 2021-01-31 DIAGNOSIS — Z79899 Other long term (current) drug therapy: Secondary | ICD-10-CM

## 2021-01-31 DIAGNOSIS — K2210 Ulcer of esophagus without bleeding: Secondary | ICD-10-CM | POA: Diagnosis present

## 2021-01-31 DIAGNOSIS — M7989 Other specified soft tissue disorders: Secondary | ICD-10-CM | POA: Diagnosis not present

## 2021-01-31 DIAGNOSIS — N179 Acute kidney failure, unspecified: Secondary | ICD-10-CM | POA: Diagnosis present

## 2021-01-31 LAB — PROTIME-INR
INR: 1.2 (ref 0.8–1.2)
Prothrombin Time: 15.1 seconds (ref 11.4–15.2)

## 2021-01-31 LAB — URINALYSIS, ROUTINE W REFLEX MICROSCOPIC
Bilirubin Urine: NEGATIVE
Glucose, UA: NEGATIVE mg/dL
Hgb urine dipstick: NEGATIVE
Ketones, ur: NEGATIVE mg/dL
Nitrite: NEGATIVE
Protein, ur: NEGATIVE mg/dL
Specific Gravity, Urine: 1.006 (ref 1.005–1.030)
pH: 5.5 (ref 5.0–8.0)

## 2021-01-31 LAB — CBC WITH DIFFERENTIAL/PLATELET
Abs Immature Granulocytes: 0.1 10*3/uL — ABNORMAL HIGH (ref 0.00–0.07)
Basophils Absolute: 0.1 10*3/uL (ref 0.0–0.1)
Basophils Relative: 0 %
Eosinophils Absolute: 0 10*3/uL (ref 0.0–0.5)
Eosinophils Relative: 0 %
HCT: 36.3 % (ref 36.0–46.0)
Hemoglobin: 12 g/dL (ref 12.0–15.0)
Immature Granulocytes: 1 %
Lymphocytes Relative: 5 %
Lymphs Abs: 0.8 10*3/uL (ref 0.7–4.0)
MCH: 28.5 pg (ref 26.0–34.0)
MCHC: 33.1 g/dL (ref 30.0–36.0)
MCV: 86.2 fL (ref 80.0–100.0)
Monocytes Absolute: 0.8 10*3/uL (ref 0.1–1.0)
Monocytes Relative: 5 %
Neutro Abs: 14.7 10*3/uL — ABNORMAL HIGH (ref 1.7–7.7)
Neutrophils Relative %: 89 %
Platelets: 277 10*3/uL (ref 150–400)
RBC: 4.21 MIL/uL (ref 3.87–5.11)
RDW: 13.2 % (ref 11.5–15.5)
WBC: 16.5 10*3/uL — ABNORMAL HIGH (ref 4.0–10.5)
nRBC: 0 % (ref 0.0–0.2)

## 2021-01-31 LAB — COMPREHENSIVE METABOLIC PANEL
ALT: 61 U/L — ABNORMAL HIGH (ref 0–44)
AST: 36 U/L (ref 15–41)
Albumin: 3.6 g/dL (ref 3.5–5.0)
Alkaline Phosphatase: 250 U/L — ABNORMAL HIGH (ref 38–126)
Anion gap: 11 (ref 5–15)
BUN: 14 mg/dL (ref 6–20)
CO2: 28 mmol/L (ref 22–32)
Calcium: 9.7 mg/dL (ref 8.9–10.3)
Chloride: 99 mmol/L (ref 98–111)
Creatinine, Ser: 1.27 mg/dL — ABNORMAL HIGH (ref 0.44–1.00)
GFR, Estimated: 52 mL/min — ABNORMAL LOW (ref >60)
Glucose, Bld: 140 mg/dL — ABNORMAL HIGH (ref 70–99)
Potassium: 3.7 mmol/L (ref 3.5–5.1)
Sodium: 138 mmol/L (ref 135–145)
Total Bilirubin: 0.7 mg/dL (ref 0.3–1.2)
Total Protein: 7.4 g/dL (ref 6.5–8.1)

## 2021-01-31 LAB — RESP PANEL BY RT-PCR (FLU A&B, COVID) ARPGX2
Influenza A by PCR: NEGATIVE
Influenza B by PCR: NEGATIVE
SARS Coronavirus 2 by RT PCR: NEGATIVE

## 2021-01-31 LAB — LIPASE, BLOOD: Lipase: 83 U/L — ABNORMAL HIGH (ref 11–51)

## 2021-01-31 MED ORDER — IOHEXOL 300 MG/ML  SOLN
75.0000 mL | Freq: Once | INTRAMUSCULAR | Status: AC | PRN
  Administered 2021-01-31: 75 mL via INTRAVENOUS

## 2021-01-31 MED ORDER — ONDANSETRON HCL 4 MG/2ML IJ SOLN
4.0000 mg | Freq: Once | INTRAMUSCULAR | Status: AC
  Administered 2021-01-31: 4 mg via INTRAVENOUS
  Filled 2021-01-31: qty 2

## 2021-01-31 MED ORDER — PROMETHAZINE HCL 25 MG/ML IJ SOLN
INTRAMUSCULAR | Status: AC
  Administered 2021-01-31: 25 mg via INTRAVENOUS
  Filled 2021-01-31: qty 1

## 2021-01-31 MED ORDER — SODIUM CHLORIDE 0.9 % IV SOLN
2000.0000 mg | Freq: Once | INTRAVENOUS | Status: AC
  Administered 2021-01-31: 2000 mg via INTRAVENOUS
  Filled 2021-01-31: qty 2

## 2021-01-31 MED ORDER — LORAZEPAM 2 MG/ML IJ SOLN
1.0000 mg | Freq: Once | INTRAMUSCULAR | Status: AC
  Administered 2021-01-31: 1 mg via INTRAVENOUS
  Filled 2021-01-31: qty 1

## 2021-01-31 MED ORDER — HEPARIN (PORCINE) 25000 UT/250ML-% IV SOLN
1400.0000 [IU]/h | INTRAVENOUS | Status: DC
  Administered 2021-01-31: 1400 [IU]/h via INTRAVENOUS
  Filled 2021-01-31: qty 250

## 2021-01-31 MED ORDER — HEPARIN BOLUS VIA INFUSION
4000.0000 [IU] | Freq: Once | INTRAVENOUS | Status: AC
  Administered 2021-01-31: 4000 [IU] via INTRAVENOUS

## 2021-01-31 MED ORDER — SODIUM CHLORIDE 0.9 % IV SOLN
1.0000 g | Freq: Once | INTRAVENOUS | Status: AC
  Administered 2021-01-31: 1 g via INTRAVENOUS
  Filled 2021-01-31: qty 10

## 2021-01-31 MED ORDER — SODIUM CHLORIDE 0.9 % IV BOLUS
1000.0000 mL | Freq: Once | INTRAVENOUS | Status: AC
  Administered 2021-01-31: 1000 mL via INTRAVENOUS

## 2021-01-31 MED ORDER — HEPARIN BOLUS VIA INFUSION: 1000.0000 [IU] | Freq: Once | INTRAVENOUS | Status: DC

## 2021-01-31 MED ORDER — PROMETHAZINE HCL 25 MG/ML IJ SOLN
INTRAMUSCULAR | Status: AC
  Filled 2021-01-31: qty 1

## 2021-01-31 MED ORDER — ACETAMINOPHEN 325 MG PO TABS
650.0000 mg | ORAL_TABLET | Freq: Once | ORAL | Status: AC
  Administered 2021-01-31: 650 mg via ORAL
  Filled 2021-01-31: qty 2

## 2021-01-31 MED ORDER — SODIUM CHLORIDE 0.9 % IV SOLN
25.0000 mg | Freq: Four times a day (QID) | INTRAVENOUS | Status: DC | PRN
  Administered 2021-01-31: 25 mg via INTRAVENOUS
  Filled 2021-01-31: qty 1

## 2021-01-31 MED ORDER — METRONIDAZOLE 500 MG/100ML IV SOLN
500.0000 mg | Freq: Once | INTRAVENOUS | Status: AC
  Administered 2021-01-31: 500 mg via INTRAVENOUS
  Filled 2021-01-31: qty 100

## 2021-01-31 NOTE — ED Notes (Signed)
Called Called [facility] to transport patient to [facility] emergency

## 2021-01-31 NOTE — ED Notes (Signed)
Per Dr Almyra Free patient no longer going to be admitted at [facility].  Advised GI Dr Nevada Crane states she needs a procedure we don't perform here--Calling [facility]

## 2021-01-31 NOTE — Progress Notes (Signed)
ANTICOAGULATION CONSULT NOTE - Initial Consult  Pharmacy Consult for Heparin Indication:  Portal vein thrombus  No Known Allergies  Patient Measurements: Height: 5\' 7"  (170.2 cm) Weight: 82.6 kg (182 lb) IBW/kg (Calculated) : 61.6 Heparin Dosing Weight: 78.7 kg  Vital Signs: Temp: 97.8 F (36.6 C) (07/01 2014) Temp Source: Oral (07/01 2014) BP: 122/75 (07/01 2014) Pulse Rate: 106 (07/01 2014)  Labs: Recent Labs    01/31/21 1539  HGB 12.0  HCT 36.3  PLT 277  CREATININE 1.27*    Estimated Creatinine Clearance: 58.6 mL/min (A) (by C-G formula based on SCr of 1.27 mg/dL (H)).   Medical History: Past Medical History:  Diagnosis Date   Arthritis     Medications:  (Not in a hospital admission)  Scheduled:  Infusions:   ceFEPime (MAXIPIME) IV     heparin 1,400 Units/hr (01/31/21 2039)   metronidazole     promethazine (PHENERGAN) injection (IM or IVPB)     Assessment: 55 yof presenting to ED with n/v and abdominal pain. Recent hospitalization in June for pancreatitis.  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  Patient is not on anticoagulation prior to arrival. H/H stable. Platelets 277.   4000 units IV heparin administered prior to pharmacy consult placement.   Goal of Therapy:  Heparin level 0.3-0.7 units/ml Monitor platelets by anticoagulation protocol: Yes   Plan:  Give additional 1000 units bolus x 1 (4000 units already given plus additional 1000 units = ~64 units/kg) Start heparin infusion at 1400 units/hr Check anti-Xa level in 6 hours and daily while on heparin Continue to monitor H&H and platelets  Lorelei Pont, PharmD, BCPS 01/31/2021 8:46 PM ED Clinical Pharmacist -  [phone]

## 2021-01-31 NOTE — ED Notes (Signed)
[facility] here for pt transport

## 2021-01-31 NOTE — ED Notes (Signed)
Called Brandi at patient placement to advise patient is going to be admitted at [facility]ICU.  Dr Duwayne Heck accepting

## 2021-01-31 NOTE — ED Notes (Signed)
Attempted report; nurse states she will call back in 10 minutes

## 2021-01-31 NOTE — ED Notes (Signed)
Pt states, "I feel like I am just going to burst". She describes herself as uncomfortable.

## 2021-01-31 NOTE — ED Notes (Signed)
Pt shaking all over, asking for something for her nerves; EDP made aware and orders given

## 2021-01-31 NOTE — ED Notes (Signed)
Dr Almyra Free advised patient can't go to [facility] because it is Med surge.  Patient needs ICU--Cancelled transport to [facility] s/w [facility] to cancel

## 2021-01-31 NOTE — Progress Notes (Signed)
Received a call from Dr. Almyra Free, [place] provider, regarding admission of Ms. Kaliana Albino from [facility] ED to [facility].   50 year old female recently discharged from [facility] long hospital on 01/22/2021 after being admitted for ERCP related pancreatitis, procedure performed at [facility].  Lengthy hospital stay from 01/07/2021 until 01/22/2021, requiring tube feedings.  She was seen by [facility].  She was discharged home after improvement of her symptomatology with a normal lipase level.  Prior to DC her NG tube was removed, she was tolerating a clear liquid diet/low-fat diet.  She was doing fine until few days ago when she developed nausea, vomiting and abdominal pain.  No improvement with home ODT Zofran or Tylenol.    Presents to [facility] ED on 01/31/2021 with complaints of nausea, vomiting, and abdominal pain for a few days.  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  Acute left portal vein thrombosis.  Also found to have UA positive for pyuria.  She was started on Rocephin in the ED.  Advised to broaden antibiotics to cefepime and IV Flagyl and to start anticoagulation with heparin drip if no contraindications.  Discussed the case with Dr. Watt Climes who is on-call for [facility].  He recommended that the patient be transferred to [facility] as her initial pancreatitis was a complication from ERCP done at [facility].  No EUS available this weekend if indicated.  Called back [facility] EDP Dr. Almyra Free and declined admission.  Requested that the patient be transferred to [facility].

## 2021-01-31 NOTE — ED Notes (Signed)
Report given to [facility]. 

## 2021-01-31 NOTE — Progress Notes (Signed)
Attempted to get pt for CT, pt was sitting upright extremely nauseated c/o reflux issues. RN notified, will try back once pt has some relief.

## 2021-01-31 NOTE — ED Notes (Signed)
Bed ready advised [facility] to please take patient to [place]

## 2021-01-31 NOTE — ED Notes (Signed)
Coffee ground emesis with heart rate up to 184, 6 second EKG strip shows SVT. HR/rhythm improved after vomiting resolved. EKG performed, Dr. Almyra Free made aware.

## 2021-01-31 NOTE — ED Notes (Signed)
Attempted report to [facility]

## 2021-01-31 NOTE — ED Notes (Addendum)
MD notified of BP and HR. Coffee ground emesis.

## 2021-01-31 NOTE — ED Notes (Signed)
Called [facility] to transport patient to [place] [facility] [facility]--[phone]

## 2021-01-31 NOTE — ED Provider Notes (Addendum)
Patient signed out to me is pending CT imaging.  CT is imaging is very concerning for worsening pancreatitis necrotic tissue diffusely in worsening pancreatic pseudocysts.  Also found is portal vein thrombosis.    Case discussed with hospitalist for admission.  However they called back recommending that they spoken with GI and that GI also recommended the patient be transferred to [facility] as the patient may need drainage of pseudocyst with EUS which reportedly is not available at this time at the [facility] system.  They also recommend that she follow-up with the primary team at [facility] again.  Patient started on broad-spectrum IV antibiotics as well as heparin drip for her portal vein thrombosis.  At this point I contacted [facility], after some time ultimately a bed was found for her.  However they only had a MedSurg bed available.  During this time the patient's initial heart rate had been in the low 100 range, however during her ER stay heart rate continued to go up to 130 bpm and then 160 and then 180 bpm.  I thought maybe this was due to anxiety as the patient appeared very anxious and given 1 mg of Ativan, this appeared to improve her heart rate back down to 130 bpm, it appeared to be sinus in nature.  However she continues to spike back up into the 150-160 bpm range.  On EKG this continue to appear to be sinus rhythm with P waves visible.  At this point I discussed her findings again with [facility], I did not feel she was suitable for MedSurg bed given her abnormal vital signs.  Given the complexity of her case and multiple comorbidities I felt she would benefit from ICU care.  I spoke with [facility] health ICU physician Dr. Duwayne Heck who agreed to accept the patient.  .Critical Care  Date/Time: 01/31/2021 10:36 PM Performed by: Luna Fuse, MD Authorized by: Luna Fuse, MD   Critical care provider statement:    Critical care time (minutes):  45   Critical care time was exclusive of:   Separately billable procedures and treating other patients   Critical care was time spent personally by me on the following activities:  Discussions with consultants, evaluation of patient's response to treatment, examination of patient, ordering and performing treatments and interventions, ordering and review of laboratory studies, ordering and review of radiographic studies, pulse oximetry, re-evaluation of patient's condition, obtaining history from patient or surrogate and review of old charts   Addendum: Patient accepted by the ICU but still awaiting bed placement.  At this time patient's heart rate is increased back again to about 150 bpm and the blood pressures dropped about 99 systolic.  I am concerned that her condition is deteriorating.  I spoken with the ER physician at [facility] and will be doing an ER to ER transfer.     Luna Fuse, MD 01/31/21 2236    Luna Fuse, MD 01/31/21 [phone]

## 2021-01-31 NOTE — ED Provider Notes (Signed)
[place] EMERGENCY DEPT Provider Note   CSN: [id number] Arrival date & time: 01/31/21  1316     History Chief Complaint  Patient presents with   Nausea    Kimberly Kelly is a 50 y.o. female.  Pt presents to the ED today with n/v and abdominal pain.  The pt was in the hospital from 6/7 to 6/22 for pancreatitis from ERCP.   She saw [facility]. The pt required tube feedings while in the hospital, but then was transitioned to clear liquids.  She said she felt a little better until she started vomiting again last night.  She was able to eat a little food this am.  She developed a fever today and took tylenol, but it came back up.  Pt tried odt zofran which did not help.      Past Medical History:  Diagnosis Date   Arthritis     Patient Active Problem List   Diagnosis Date Noted   Hypokalemia 01/08/2021   Thrombocytosis 01/08/2021   Leukocytosis 01/08/2021   Bradycardia 01/08/2021   Depression 01/08/2021   Anxiety 01/08/2021   Acute pancreatitis 01/07/2021   S/P ERCP 01/07/2021   Acute urinary retention 01/07/2021   Lung nodule 12/27/2020   Symptomatic cholelithiasis 10/10/2016    Past Surgical History:  Procedure Laterality Date   CESAREAN SECTION     CHOLECYSTECTOMY N/A 10/10/2016   Procedure: LAPAROSCOPIC CHOLECYSTECTOMY WITH INTRAOPERATIVE CHOLANGIOGRAM;  Surgeon: Erroll Luna, MD;  Location: [facility];  Service: General;  Laterality: N/A;   SHOULDER ARTHROSCOPY     TUBAL LIGATION       OB History   No obstetric history on file.     Family History  Problem Relation Age of Onset   Breast cancer Paternal Grandmother     Social History   Tobacco Use   Smoking status: Never   Smokeless tobacco: Never  Substance Use Topics   Alcohol use: Yes    Alcohol/week: 7.0 standard drinks    Types: 7 Glasses of wine per week   Drug use: No    Home Medications Prior to Admission medications   Medication Sig Start Date End Date Taking? Authorizing  Provider  escitalopram (LEXAPRO) 10 MG tablet Take 10 mg by mouth daily. 08/13/20   [provider]  omeprazole (PRILOSEC) 40 MG capsule Take 1 capsule (40 mg total) by mouth daily. 01/22/21 02/21/21  Darliss Cheney, MD  ondansetron (ZOFRAN ODT) 4 MG disintegrating tablet Take 1 tablet (4 mg total) by mouth every 8 (eight) hours as needed for nausea or vomiting. 01/22/21   Darliss Cheney, MD    Allergies    Patient has no known allergies.  Review of Systems   Review of Systems  Gastrointestinal:  Positive for abdominal pain, nausea and vomiting.  All other systems reviewed and are negative.  Physical Exam Updated Vital Signs BP 112/65 (BP Location: Left Arm)   Pulse (!) 150   Temp 98.6 F (37 C)   Resp 18   Ht 5\' 7"  (1.702 m)   Wt 82.6 kg   LMP 02/14/2020   SpO2 96%   BMI 28.51 kg/m   Physical Exam Vitals and nursing note reviewed.  Constitutional:      General: She is in acute distress.  HENT:     Head: Normocephalic and atraumatic.     Right Ear: External ear normal.     Left Ear: External ear normal.     Nose: Nose normal.  Mouth/Throat:     Mouth: Mucous membranes are dry.  Eyes:     Extraocular Movements: Extraocular movements intact.     Conjunctiva/sclera: Conjunctivae normal.     Pupils: Pupils are equal, round, and reactive to light.  Cardiovascular:     Rate and Rhythm: Regular rhythm. Tachycardia present.     Pulses: Normal pulses.     Heart sounds: Normal heart sounds.  Pulmonary:     Effort: Pulmonary effort is normal.     Breath sounds: Normal breath sounds.  Abdominal:     General: Abdomen is flat. Bowel sounds are normal.     Palpations: Abdomen is soft.     Tenderness: There is abdominal tenderness in the epigastric area.  Musculoskeletal:        General: Normal range of motion.     Cervical back: Normal range of motion and neck supple.  Skin:    General: Skin is warm.     Capillary Refill: Capillary refill takes less than 2 seconds.   Neurological:     General: No focal deficit present.     Mental Status: She is alert and oriented to person, place, and time.  Psychiatric:        Mood and Affect: Mood normal.        Behavior: Behavior normal.        Thought Content: Thought content normal.        Judgment: Judgment normal.    ED Results / Procedures / Treatments   Labs (all labs ordered are listed, but only abnormal results are displayed) Labs Reviewed  CBC WITH DIFFERENTIAL/PLATELET  COMPREHENSIVE METABOLIC PANEL  LIPASE, BLOOD  URINALYSIS, ROUTINE W REFLEX MICROSCOPIC    EKG EKG Interpretation  Date/Time:  Friday January 31 2021 13:53:37 EDT Ventricular Rate:  147 PR Interval:  128 QRS Duration: 78 QT Interval:  276 QTC Calculation: 431 R Axis:   92 Text Interpretation: Sinus tachycardia Rightward axis Nonspecific ST abnormality Abnormal ECG Since last tracing rate faster Confirmed by Isla Pence [phone]) on 01/31/2021 2:51:22 PM  Radiology No results found.  Procedures Procedures   Medications Ordered in ED Medications  sodium chloride 0.9 % bolus 1,000 mL (has no administration in time range)  ondansetron (ZOFRAN) injection 4 mg (has no administration in time range)    ED Course  I have reviewed the triage vital signs and the nursing notes.  Pertinent labs & imaging results that were available during my care of the patient were reviewed by me and considered in my medical decision making (see chart for details).    MDM Rules/Calculators/A&P                           Fluids and labs ordered.  Nothing has resulted yet.  Pt signed out to Dr. Almyra Free at shift change.  Final Clinical Impression(s) / ED Diagnoses Final diagnoses:  Dehydration  Nausea and vomiting, intractability of vomiting not specified, unspecified vomiting type    Rx / DC Orders ED Discharge Orders     None        Isla Pence, MD 01/31/21 1537

## 2021-01-31 NOTE — ED Notes (Signed)
Report given to Charge RN with [facility]

## 2021-02-01 ENCOUNTER — Inpatient Hospital Stay (HOSPITAL_COMMUNITY): Payer: BC Managed Care – PPO

## 2021-02-01 ENCOUNTER — Encounter (HOSPITAL_COMMUNITY): Payer: Self-pay | Admitting: Pulmonary Disease

## 2021-02-01 ENCOUNTER — Inpatient Hospital Stay (HOSPITAL_COMMUNITY): Payer: BC Managed Care – PPO | Admitting: Certified Registered Nurse Anesthetist

## 2021-02-01 ENCOUNTER — Encounter (HOSPITAL_COMMUNITY)
Admission: EM | Disposition: A | Discharge status: Other Acute Inpatient Hospital | Payer: Self-pay | Source: Home / Self Care | Attending: Internal Medicine

## 2021-02-01 DIAGNOSIS — M1990 Unspecified osteoarthritis, unspecified site: Secondary | ICD-10-CM | POA: Diagnosis present

## 2021-02-01 DIAGNOSIS — K209 Esophagitis, unspecified without bleeding: Secondary | ICD-10-CM | POA: Diagnosis not present

## 2021-02-01 DIAGNOSIS — K863 Pseudocyst of pancreas: Secondary | ICD-10-CM

## 2021-02-01 DIAGNOSIS — D62 Acute posthemorrhagic anemia: Secondary | ICD-10-CM | POA: Diagnosis present

## 2021-02-01 DIAGNOSIS — A419 Sepsis, unspecified organism: Secondary | ICD-10-CM | POA: Diagnosis present

## 2021-02-01 DIAGNOSIS — Z20822 Contact with and (suspected) exposure to covid-19: Secondary | ICD-10-CM | POA: Diagnosis present

## 2021-02-01 DIAGNOSIS — N390 Urinary tract infection, site not specified: Secondary | ICD-10-CM | POA: Diagnosis present

## 2021-02-01 DIAGNOSIS — I81 Portal vein thrombosis: Secondary | ICD-10-CM | POA: Diagnosis present

## 2021-02-01 DIAGNOSIS — K3189 Other diseases of stomach and duodenum: Secondary | ICD-10-CM | POA: Diagnosis not present

## 2021-02-01 DIAGNOSIS — F419 Anxiety disorder, unspecified: Secondary | ICD-10-CM | POA: Diagnosis present

## 2021-02-01 DIAGNOSIS — Z79899 Other long term (current) drug therapy: Secondary | ICD-10-CM | POA: Diagnosis not present

## 2021-02-01 DIAGNOSIS — K922 Gastrointestinal hemorrhage, unspecified: Secondary | ICD-10-CM

## 2021-02-01 DIAGNOSIS — Z9049 Acquired absence of other specified parts of digestive tract: Secondary | ICD-10-CM | POA: Diagnosis not present

## 2021-02-01 DIAGNOSIS — E860 Dehydration: Secondary | ICD-10-CM | POA: Diagnosis present

## 2021-02-01 DIAGNOSIS — K8582 Other acute pancreatitis with infected necrosis: Secondary | ICD-10-CM | POA: Diagnosis not present

## 2021-02-01 DIAGNOSIS — R739 Hyperglycemia, unspecified: Secondary | ICD-10-CM | POA: Diagnosis present

## 2021-02-01 DIAGNOSIS — K858 Other acute pancreatitis without necrosis or infection: Secondary | ICD-10-CM | POA: Diagnosis not present

## 2021-02-01 DIAGNOSIS — M7989 Other specified soft tissue disorders: Secondary | ICD-10-CM | POA: Diagnosis not present

## 2021-02-01 DIAGNOSIS — N179 Acute kidney failure, unspecified: Secondary | ICD-10-CM

## 2021-02-01 DIAGNOSIS — K224 Dyskinesia of esophagus: Secondary | ICD-10-CM | POA: Diagnosis present

## 2021-02-01 DIAGNOSIS — K2900 Acute gastritis without bleeding: Secondary | ICD-10-CM | POA: Diagnosis present

## 2021-02-01 DIAGNOSIS — F32A Depression, unspecified: Secondary | ICD-10-CM | POA: Diagnosis present

## 2021-02-01 DIAGNOSIS — K2100 Gastro-esophageal reflux disease with esophagitis, without bleeding: Secondary | ICD-10-CM | POA: Diagnosis present

## 2021-02-01 DIAGNOSIS — K862 Cyst of pancreas: Secondary | ICD-10-CM | POA: Diagnosis present

## 2021-02-01 DIAGNOSIS — R918 Other nonspecific abnormal finding of lung field: Secondary | ICD-10-CM | POA: Diagnosis present

## 2021-02-01 DIAGNOSIS — K8591 Acute pancreatitis with uninfected necrosis, unspecified: Secondary | ICD-10-CM | POA: Diagnosis present

## 2021-02-01 DIAGNOSIS — D649 Anemia, unspecified: Secondary | ICD-10-CM | POA: Diagnosis not present

## 2021-02-01 DIAGNOSIS — R7401 Elevation of levels of liver transaminase levels: Secondary | ICD-10-CM | POA: Diagnosis present

## 2021-02-01 DIAGNOSIS — K2210 Ulcer of esophagus without bleeding: Secondary | ICD-10-CM | POA: Diagnosis present

## 2021-02-01 DIAGNOSIS — R590 Localized enlarged lymph nodes: Secondary | ICD-10-CM | POA: Diagnosis not present

## 2021-02-01 HISTORY — PX: ESOPHAGOGASTRODUODENOSCOPY (EGD) WITH PROPOFOL: SHX5813

## 2021-02-01 LAB — CBC WITH DIFFERENTIAL/PLATELET
Abs Immature Granulocytes: 0.09 10*3/uL — ABNORMAL HIGH (ref 0.00–0.07)
Basophils Absolute: 0 10*3/uL (ref 0.0–0.1)
Basophils Relative: 0 %
Eosinophils Absolute: 0 10*3/uL (ref 0.0–0.5)
Eosinophils Relative: 0 %
HCT: 30.4 % — ABNORMAL LOW (ref 36.0–46.0)
Hemoglobin: 9.8 g/dL — ABNORMAL LOW (ref 12.0–15.0)
Immature Granulocytes: 1 %
Lymphocytes Relative: 6 %
Lymphs Abs: 0.8 10*3/uL (ref 0.7–4.0)
MCH: 28.2 pg (ref 26.0–34.0)
MCHC: 32.2 g/dL (ref 30.0–36.0)
MCV: 87.4 fL (ref 80.0–100.0)
Monocytes Absolute: 0.5 10*3/uL (ref 0.1–1.0)
Monocytes Relative: 4 %
Neutro Abs: 10.9 10*3/uL — ABNORMAL HIGH (ref 1.7–7.7)
Neutrophils Relative %: 89 %
Platelets: 227 10*3/uL (ref 150–400)
RBC: 3.48 MIL/uL — ABNORMAL LOW (ref 3.87–5.11)
RDW: 13.2 % (ref 11.5–15.5)
WBC: 12.4 10*3/uL — ABNORMAL HIGH (ref 4.0–10.5)
nRBC: 0 % (ref 0.0–0.2)

## 2021-02-01 LAB — CBC
HCT: 30.5 % — ABNORMAL LOW (ref 36.0–46.0)
Hemoglobin: 10.1 g/dL — ABNORMAL LOW (ref 12.0–15.0)
MCH: 28.8 pg (ref 26.0–34.0)
MCHC: 33.1 g/dL (ref 30.0–36.0)
MCV: 86.9 fL (ref 80.0–100.0)
Platelets: 220 10*3/uL (ref 150–400)
RBC: 3.51 MIL/uL — ABNORMAL LOW (ref 3.87–5.11)
RDW: 13.2 % (ref 11.5–15.5)
WBC: 12 10*3/uL — ABNORMAL HIGH (ref 4.0–10.5)
nRBC: 0 % (ref 0.0–0.2)

## 2021-02-01 LAB — BASIC METABOLIC PANEL
Anion gap: 10 (ref 5–15)
Anion gap: 13 (ref 5–15)
BUN: 10 mg/dL (ref 6–20)
BUN: 10 mg/dL (ref 6–20)
CO2: 24 mmol/L (ref 22–32)
CO2: 24 mmol/L (ref 22–32)
Calcium: 8.3 mg/dL — ABNORMAL LOW (ref 8.9–10.3)
Calcium: 8.4 mg/dL — ABNORMAL LOW (ref 8.9–10.3)
Chloride: 104 mmol/L (ref 98–111)
Chloride: 104 mmol/L (ref 98–111)
Creatinine, Ser: 1.2 mg/dL — ABNORMAL HIGH (ref 0.44–1.00)
Creatinine, Ser: 1.19 mg/dL — ABNORMAL HIGH (ref 0.44–1.00)
GFR, Estimated: 55 mL/min — ABNORMAL LOW (ref >60)
GFR, Estimated: 56 mL/min — ABNORMAL LOW (ref >60)
Glucose, Bld: 152 mg/dL — ABNORMAL HIGH (ref 70–99)
Glucose, Bld: 174 mg/dL — ABNORMAL HIGH (ref 70–99)
Potassium: 3.1 mmol/L — ABNORMAL LOW (ref 3.5–5.1)
Potassium: 3.7 mmol/L (ref 3.5–5.1)
Sodium: 138 mmol/L (ref 135–145)
Sodium: 141 mmol/L (ref 135–145)

## 2021-02-01 LAB — GLUCOSE, CAPILLARY
Glucose-Capillary: 107 mg/dL — ABNORMAL HIGH (ref 70–99)
Glucose-Capillary: 139 mg/dL — ABNORMAL HIGH (ref 70–99)
Glucose-Capillary: 210 mg/dL — ABNORMAL HIGH (ref 70–99)

## 2021-02-01 LAB — PROTIME-INR
INR: 1.2 (ref 0.8–1.2)
Prothrombin Time: 15.6 seconds — ABNORMAL HIGH (ref 11.4–15.2)

## 2021-02-01 LAB — PROCALCITONIN: Procalcitonin: 17.13 ng/mL

## 2021-02-01 LAB — MAGNESIUM: Magnesium: 1.7 mg/dL (ref 1.7–2.4)

## 2021-02-01 LAB — HEPARIN LEVEL (UNFRACTIONATED)
Heparin Unfractionated: 0.1 IU/mL — ABNORMAL LOW (ref 0.30–0.70)
Heparin Unfractionated: 0.11 IU/mL — ABNORMAL LOW (ref 0.30–0.70)

## 2021-02-01 LAB — HIV ANTIBODY (ROUTINE TESTING W REFLEX): HIV Screen 4th Generation wRfx: NONREACTIVE

## 2021-02-01 LAB — MRSA NEXT GEN BY PCR, NASAL: MRSA by PCR Next Gen: NOT DETECTED

## 2021-02-01 LAB — LACTIC ACID, PLASMA
Lactic Acid, Venous: 0.8 mmol/L (ref 0.5–1.9)
Lactic Acid, Venous: 0.9 mmol/L (ref 0.5–1.9)

## 2021-02-01 LAB — LIPASE, BLOOD: Lipase: 74 U/L — ABNORMAL HIGH (ref 11–51)

## 2021-02-01 SURGERY — ESOPHAGOGASTRODUODENOSCOPY (EGD) WITH PROPOFOL
Anesthesia: Monitor Anesthesia Care

## 2021-02-01 MED ORDER — POTASSIUM CHLORIDE 10 MEQ/100ML IV SOLN
10.0000 meq | INTRAVENOUS | Status: AC
  Administered 2021-02-01 (×4): 10 meq via INTRAVENOUS
  Filled 2021-02-01 (×4): qty 100

## 2021-02-01 MED ORDER — PANTOPRAZOLE 80MG IVPB - SIMPLE MED
80.0000 mg | Freq: Once | INTRAVENOUS | Status: AC
  Administered 2021-02-01: 80 mg via INTRAVENOUS
  Filled 2021-02-01: qty 80

## 2021-02-01 MED ORDER — LACTATED RINGERS IV BOLUS
1000.0000 mL | Freq: Once | INTRAVENOUS | Status: AC
  Administered 2021-02-01: 1000 mL via INTRAVENOUS

## 2021-02-01 MED ORDER — OXYCODONE HCL 5 MG PO TABS
5.0000 mg | ORAL_TABLET | ORAL | Status: DC | PRN
  Administered 2021-02-01 – 2021-02-03 (×3): 10 mg via ORAL
  Administered 2021-02-04 – 2021-02-08 (×13): 5 mg via ORAL
  Administered 2021-02-08 – 2021-02-09 (×2): 10 mg via ORAL
  Administered 2021-02-09 – 2021-02-11 (×3): 5 mg via ORAL
  Filled 2021-02-01: qty 1
  Filled 2021-02-01: qty 2
  Filled 2021-02-01: qty 1
  Filled 2021-02-01: qty 2
  Filled 2021-02-01: qty 1
  Filled 2021-02-01: qty 2
  Filled 2021-02-01 (×13): qty 1
  Filled 2021-02-01: qty 2
  Filled 2021-02-01: qty 1
  Filled 2021-02-01 (×2): qty 2
  Filled 2021-02-01: qty 1

## 2021-02-01 MED ORDER — SODIUM CHLORIDE 0.9 % IV SOLN
12.5000 mg | Freq: Four times a day (QID) | INTRAVENOUS | Status: DC | PRN
  Filled 2021-02-01 (×2): qty 0.5

## 2021-02-01 MED ORDER — PROPOFOL 500 MG/50ML IV EMUL
INTRAVENOUS | Status: DC | PRN
  Administered 2021-02-01: 150 ug/kg/min via INTRAVENOUS

## 2021-02-01 MED ORDER — HEPARIN (PORCINE) 25000 UT/250ML-% IV SOLN
1400.0000 [IU]/h | INTRAVENOUS | Status: DC
  Administered 2021-02-01: 1400 [IU]/h via INTRAVENOUS

## 2021-02-01 MED ORDER — HEPARIN (PORCINE) 25000 UT/250ML-% IV SOLN
2250.0000 [IU]/h | INTRAVENOUS | Status: DC
  Administered 2021-02-02: 1550 [IU]/h via INTRAVENOUS
  Administered 2021-02-02: 1700 [IU]/h via INTRAVENOUS
  Administered 2021-02-03: 2250 [IU]/h via INTRAVENOUS
  Administered 2021-02-03: 2100 [IU]/h via INTRAVENOUS
  Administered 2021-02-04: 2250 [IU]/h via INTRAVENOUS
  Filled 2021-02-01 (×6): qty 250

## 2021-02-01 MED ORDER — SODIUM CHLORIDE 0.9 % IV SOLN
50.0000 ug/h | INTRAVENOUS | Status: DC
  Administered 2021-02-01 (×2): 50 ug/h via INTRAVENOUS
  Filled 2021-02-01 (×3): qty 1

## 2021-02-01 MED ORDER — METRONIDAZOLE 500 MG/100ML IV SOLN
500.0000 mg | Freq: Three times a day (TID) | INTRAVENOUS | Status: DC
  Administered 2021-02-01: 500 mg via INTRAVENOUS
  Filled 2021-02-01: qty 100

## 2021-02-01 MED ORDER — CHLORHEXIDINE GLUCONATE CLOTH 2 % EX PADS
6.0000 | MEDICATED_PAD | Freq: Every day | CUTANEOUS | Status: DC
  Administered 2021-02-01 – 2021-02-09 (×7): 6 via TOPICAL

## 2021-02-01 MED ORDER — POLYETHYLENE GLYCOL 3350 17 G PO PACK: 17.0000 g | PACK | Freq: Every day | ORAL | Status: DC | PRN

## 2021-02-01 MED ORDER — LACTATED RINGERS IV SOLN: INTRAVENOUS | Status: DC | PRN

## 2021-02-01 MED ORDER — PROPOFOL 10 MG/ML IV BOLUS
INTRAVENOUS | Status: DC | PRN
  Administered 2021-02-01: 40 mg via INTRAVENOUS

## 2021-02-01 MED ORDER — POTASSIUM CHLORIDE 10 MEQ/100ML IV SOLN
10.0000 meq | INTRAVENOUS | Status: AC
  Administered 2021-02-01 (×3): 10 meq via INTRAVENOUS
  Filled 2021-02-01 (×3): qty 100

## 2021-02-01 MED ORDER — DOCUSATE SODIUM 100 MG PO CAPS
100.0000 mg | ORAL_CAPSULE | Freq: Two times a day (BID) | ORAL | Status: DC | PRN
  Administered 2021-02-02 (×2): 100 mg via ORAL
  Filled 2021-02-01 (×2): qty 1

## 2021-02-01 MED ORDER — ONDANSETRON HCL 4 MG/2ML IJ SOLN
INTRAMUSCULAR | Status: DC | PRN
  Administered 2021-02-01: 4 mg via INTRAVENOUS

## 2021-02-01 MED ORDER — LIDOCAINE HCL (CARDIAC) PF 100 MG/5ML IV SOSY
PREFILLED_SYRINGE | INTRAVENOUS | Status: AC
  Filled 2021-02-01: qty 5

## 2021-02-01 MED ORDER — MORPHINE SULFATE (PF) 2 MG/ML IV SOLN
1.0000 mg | INTRAVENOUS | Status: DC | PRN
  Administered 2021-02-01 – 2021-02-07 (×7): 1 mg via INTRAVENOUS
  Filled 2021-02-01 (×9): qty 1

## 2021-02-01 MED ORDER — INSULIN ASPART 100 UNIT/ML IJ SOLN
1.0000 [IU] | INTRAMUSCULAR | Status: DC
  Administered 2021-02-01: 3 [IU] via SUBCUTANEOUS
  Administered 2021-02-02 (×2): 1 [IU] via SUBCUTANEOUS
  Administered 2021-02-02: 2 [IU] via SUBCUTANEOUS
  Administered 2021-02-02 – 2021-02-03 (×3): 1 [IU] via SUBCUTANEOUS
  Administered 2021-02-04: 3 [IU] via SUBCUTANEOUS
  Administered 2021-02-05 – 2021-02-06 (×3): 1 [IU] via SUBCUTANEOUS
  Administered 2021-02-07: 2 [IU] via SUBCUTANEOUS
  Administered 2021-02-08 – 2021-02-11 (×8): 1 [IU] via SUBCUTANEOUS

## 2021-02-01 MED ORDER — SODIUM CHLORIDE 0.9 % IV SOLN: INTRAVENOUS | Status: DC

## 2021-02-01 MED ORDER — MAGNESIUM SULFATE 2 GM/50ML IV SOLN
2.0000 g | Freq: Once | INTRAVENOUS | Status: AC
  Administered 2021-02-01: 2 g via INTRAVENOUS
  Filled 2021-02-01: qty 50

## 2021-02-01 MED ORDER — ONDANSETRON HCL 4 MG/2ML IJ SOLN
4.0000 mg | Freq: Four times a day (QID) | INTRAMUSCULAR | Status: DC | PRN
  Administered 2021-02-01 – 2021-02-11 (×21): 4 mg via INTRAVENOUS
  Filled 2021-02-01 (×24): qty 2

## 2021-02-01 MED ORDER — SODIUM CHLORIDE 0.9 % IV SOLN
1.0000 g | Freq: Three times a day (TID) | INTRAVENOUS | Status: DC
  Administered 2021-02-01 – 2021-02-11 (×31): 1 g via INTRAVENOUS
  Filled 2021-02-01 (×34): qty 1

## 2021-02-01 MED ORDER — SODIUM CHLORIDE 0.9 % IV SOLN: 2.0000 g | Freq: Two times a day (BID) | INTRAVENOUS | Status: DC

## 2021-02-01 MED ORDER — LIDOCAINE HCL (CARDIAC) PF 100 MG/5ML IV SOSY
PREFILLED_SYRINGE | INTRAVENOUS | Status: DC | PRN
  Administered 2021-02-01: 60 mg via INTRAVENOUS

## 2021-02-01 SURGICAL SUPPLY — 15 items

## 2021-02-01 NOTE — Progress Notes (Signed)
ANTICOAGULATION CONSULT NOTE   Pharmacy Consult for Heparin Indication:  Portal vein thrombus  No Known Allergies  Patient Measurements: Height: 5\' 7"  (170.2 cm) Weight: 84.7 kg (186 lb 11.7 oz) IBW/kg (Calculated) : 61.6 Heparin Dosing Weight: 78.7 kg  Vital Signs: Temp: 98.7 F (37.1 C) (07/02 1330) Temp Source: Oral (07/02 1224) BP: 126/86 (07/02 1330) Pulse Rate: 119 (07/02 1330)  Labs: Recent Labs    01/31/21 1539 01/31/21 2224 02/01/21 0201 02/01/21 0229 02/01/21 0452  HGB 12.0  --  9.8*  --  10.1*  HCT 36.3  --  30.4*  --  30.5*  PLT 277  --  227  --  220  LABPROT  --  15.1  --   --  15.6*  INR  --  1.2  --   --  1.2  HEPARINUNFRC  --   --   --  0.10*  --   CREATININE 1.27*  --  1.20*  --  1.19*     Estimated Creatinine Clearance: 63.2 mL/min (A) (by C-G formula based on SCr of 1.19 mg/dL (H)).   Assessment: 55 yof presenting to ED with n/v and abdominal pain. Recent hospitalization in June for pancreatitis.  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  Ok to resume heparin per GI and CCM  Goal of Therapy:  Heparin level 0.3-0.7 units/ml Monitor platelets by anticoagulation protocol: Yes   Plan:  Start heparin infusion at 1400 units/hr 2200 hep lvl Daily hep lvl cbc  Barth Kirks, PharmD, BCCCP Clinical Pharmacist [phone]  Please check AMION for all [place] numbers  02/01/2021 2:35 PM

## 2021-02-01 NOTE — Progress Notes (Signed)
[facility] ADULT ICU REPLACEMENT PROTOCOL   The patient does apply for the [facility] Adult ICU Electrolyte Replacment Protocol based on the criteria listed below:   1. Is GFR >/= 30 ml/min? Yes.    Patient's GFR today is 56 2. Is SCr </= 2? Yes.   Patient's SCr is 1.19 ml/kg/hr 3. Did SCr increase >/= 0.5 in 24 hours? No. 4. Abnormal electrolyte(s): mag (1.7),  (),  () 5. Ordered repletion with: protocol 6. If a panic level lab has been reported, has the CCM MD in charge been notified? No..   Physician:    Ronda Fairly A 02/01/2021 6:31 AM

## 2021-02-01 NOTE — Progress Notes (Signed)
ANTICOAGULATION CONSULT NOTE  Pharmacy Consult for Heparin Indication:  Portal vein thrombus  No Known Allergies  Patient Measurements: Height: 5\' 7"  (170.2 cm) Weight: 84.7 kg (186 lb 11.7 oz) IBW/kg (Calculated) : 61.6 Heparin Dosing Weight: 78.7 kg  Vital Signs: Temp: 98.7 F (37.1 C) (07/02 0326) Temp Source: Oral (07/02 0326) BP: 113/75 (07/02 0330) Pulse Rate: 114 (07/02 0330)  Labs: Recent Labs    01/31/21 1539 01/31/21 2224 02/01/21 0201 02/01/21 0229  HGB 12.0  --  9.8*  --   HCT 36.3  --  30.4*  --   PLT 277  --  227  --   LABPROT  --  15.1  --   --   INR  --  1.2  --   --   HEPARINUNFRC  --   --   --  0.10*  CREATININE 1.27*  --  1.20*  --      Estimated Creatinine Clearance: 62.7 mL/min (A) (by C-G formula based on SCr of 1.2 mg/dL (H)).   Medical History: Past Medical History:  Diagnosis Date   Arthritis     Medications:  Medications Prior to Admission  Medication Sig Dispense Refill Last Dose   escitalopram (LEXAPRO) 10 MG tablet Take 10 mg by mouth daily.      omeprazole (PRILOSEC) 40 MG capsule Take 1 capsule (40 mg total) by mouth daily. 30 capsule 0    ondansetron (ZOFRAN ODT) 4 MG disintegrating tablet Take 1 tablet (4 mg total) by mouth every 8 (eight) hours as needed for nausea or vomiting. 20 tablet 0    Scheduled:   Chlorhexidine Gluconate Cloth  6 each Topical Daily   heparin  1,000 Units Intravenous Once   Infusions:   ceFEPime (MAXIPIME) IV     heparin 1,400 Units/hr (01/31/21 2039)   metronidazole 500 mg (02/01/21 0231)   octreotide  (SANDOSTATIN)    IV infusion 50 mcg/hr (02/01/21 0227)   potassium chloride     promethazine (PHENERGAN) injection (IM or IVPB) Stopped (01/31/21 2346)   Assessment: 21 yof presenting to ED with n/v and abdominal pain. Recent hospitalization in June for pancreatitis.  CT abdomen showing severe pancreatitis with interval development of diffuse pancreatic necrosis, and numerous pancreatic  pseudocysts, a new nonocclusive thrombus in distal main and proximal left portal veins.  Patient is not on anticoagulation prior to arrival. H/H stable. Platelets 277.   4000 units IV heparin administered prior to pharmacy consult placement.   7/2 AM update:  Per RN, heparin on hold due to coffee ground emesis  Goal of Therapy:  Heparin level 0.3-0.7 units/ml Monitor platelets by anticoagulation protocol: Yes   Plan:  Per RN, holding heparin for now due to coffee ground emesis GI to see F/U [facility] plans after GI consult  Narda Bonds, PharmD, BCPS Clinical Pharmacist Phone: [phone]

## 2021-02-01 NOTE — Anesthesia Preprocedure Evaluation (Addendum)
Anesthesia Evaluation  Patient identified by MRN, date of birth, ID band Patient awake    Airway Mallampati: II  TM Distance: >3 FB Neck ROM: Full    Dental  (+) Teeth Intact   Pulmonary neg pulmonary ROS,    Pulmonary exam normal        Cardiovascular negative cardio ROS   Rhythm:Regular Rate:Normal     Neuro/Psych Anxiety Depression negative neurological ROS     GI/Hepatic Neg liver ROS, GERD  ,  Endo/Other  negative endocrine ROS  Renal/GU negative Renal ROS  negative genitourinary   Musculoskeletal  (+) Arthritis ,   Abdominal (+)  Abdomen: soft.    Peds  Hematology negative hematology ROS (+)   Anesthesia Other Findings   Reproductive/Obstetrics                            Anesthesia Physical Anesthesia Plan  ASA: 2  Anesthesia Plan: MAC   Post-op Pain Management:    Induction: Intravenous  PONV Risk Score and Plan: 2 and Treatment may vary due to age or medical condition and Propofol infusion  Airway Management Planned: Simple Face Mask, Natural Airway and Nasal Cannula  Additional Equipment: None  Intra-op Plan:   Post-operative Plan:   Informed Consent: I have reviewed the patients History and Physical, chart, labs and discussed the procedure including the risks, benefits and alternatives for the proposed anesthesia with the patient or authorized representative who has indicated his/her understanding and acceptance.     Dental advisory given  Plan Discussed with: CRNA  Anesthesia Plan Comments: (Lab Results      Component                Value               Date                      WBC                      12.0 (H)            02/01/2021                HGB                      10.1 (L)            02/01/2021                HCT                      30.5 (L)            02/01/2021                MCV                      86.9                02/01/2021                 PLT                      220                 02/01/2021          )  Anesthesia Quick Evaluation  

## 2021-02-01 NOTE — Progress Notes (Signed)
Pt transported to endoscopy via wheelchair for EGD.

## 2021-02-01 NOTE — Op Note (Signed)
[facility] Patient Name: Kimberly Kelly Procedure Date : 02/01/2021 MRN: [id number] Attending MD: Clarene Essex , MD Date of Birth: 04-Mar-1971 CSN: [id number] Age: 50 Admit Type: Inpatient Procedure:                Upper GI endoscopy Indications:              Coffee-ground emesis Providers:                Clarene Essex, MD, Particia Nearing, RN, Cletis Athens,                            Technician, Trixie Deis, CRNA Referring MD:              Medicines:                Propofol total dose 179 mg IV, 60 mg IV lidocaine Complications:            No immediate complications. Estimated Blood Loss:     Estimated blood loss: none. Procedure:                Pre-Anesthesia Assessment:                           - Prior to the procedure, a History and Physical                            was performed, and patient medications and                            allergies were reviewed. The patient's tolerance of                            previous anesthesia was also reviewed. The risks                            and benefits of the procedure and the sedation                            options and risks were discussed with the patient.                            All questions were answered, and informed consent                            was obtained. Prior Anticoagulants: The patient has                            taken no previous anticoagulant or antiplatelet                            agents. ASA Grade Assessment: II - A patient with                            mild systemic disease. After reviewing the risks  and benefits, the patient was deemed in                            satisfactory condition to undergo the procedure.                           After obtaining informed consent, the endoscope was                            passed under direct vision. Throughout the                            procedure, the patient's blood pressure, pulse, and                             oxygen saturations were monitored continuously. The                            GIF-H190 ([id number]) Olympus gastroscope was                            introduced through the mouth, and advanced to the                            third part of duodenum. The upper GI endoscopy was                            accomplished without difficulty. The patient                            tolerated the procedure well. Scope In: Scope Out: Findings:      The larynx was normal.      Moderately severe esophagitis with no bleeding was found in the mid and       distal esophagus.      One superficial esophageal ulcer with no stigmata of recent bleeding was       found in the mid esophagus.      Diffuse mild inflammation characterized by congestion (edema) and       erythema was found in the entire examined stomach.      Extrinsic compression on the stomach was found in the cardia and in the       gastric body.      The duodenal bulb, first portion of the duodenum, second portion of the       duodenum and third portion of the duodenum were normal.      The exam was otherwise without abnormality. Impression:               - Normal larynx.                           - Moderately severe reflux esophagitis with no                            bleeding.                           -  Esophageal ulcer with no stigmata of recent                            bleeding.                           - Acute gastritis.                           - Extrinsic compression in the cardia and in the                            gastric body.                           - Normal duodenal bulb, first portion of the                            duodenum, second portion of the duodenum and third                            portion of the duodenum.                           - The examination was otherwise normal.                           - No specimens collected. Recommendation:           - Clear liquid diet today.                           -  Continue present medications. Can restart heparin                            watch for signs of bleeding continue pump                            inhibitors but probably okay to wean octreotide                           - Return to GI clinic PRN. We will discuss cyst                            drainage with Dr. Rush Landmark as well as his                            availability next week                           - Telephone GI clinic if symptomatic PRN. Procedure Code(s):        --- Professional ---                           [phone], Esophagogastroduodenoscopy, flexible,  transoral; diagnostic, including collection of                            specimen(s) by brushing or washing, when performed                            (separate procedure) Diagnosis Code(s):        --- Professional ---                           K21.00, Gastro-esophageal reflux disease with                            esophagitis, without bleeding                           K22.10, Ulcer of esophagus without bleeding                           K29.00, Acute gastritis without bleeding                           K31.89, Other diseases of stomach and duodenum                           K92.0, Hematemesis CPT copyright 2019 [employer]. All rights reserved. The codes documented in this report are preliminary and upon coder review may  be revised to meet current compliance requirements. Clarene Essex, MD 02/01/2021 1:08:54 PM This report has been signed electronically. Number of Addenda: 0

## 2021-02-01 NOTE — Transfer of Care (Signed)
Immediate Anesthesia Transfer of Care Note  Patient: Kimberly Kelly  Procedure(s) Performed: ESOPHAGOGASTRODUODENOSCOPY (EGD) WITH PROPOFOL  Patient Location: PACU  Anesthesia Type:MAC  Level of Consciousness: drowsy and patient cooperative  Airway & Oxygen Therapy: Patient Spontanous Breathing and Patient connected to nasal cannula oxygen  Post-op Assessment: Report given to RN, Post -op Vital signs reviewed and stable and Patient moving all extremities  Post vital signs: Reviewed and stable  Last Vitals:  Vitals Value Taken Time  BP 113/73 02/01/21 1306  Temp    Pulse 117 02/01/21 1312  Resp 24 02/01/21 1312  SpO2 100 % 02/01/21 1312  Vitals shown include unvalidated device data.  Last Pain:  Vitals:   02/01/21 1224  TempSrc: Oral  PainSc: 0-No pain      Patients Stated Pain Goal: 0 (77/93/90 3009)  Complications: No notable events documented.

## 2021-02-01 NOTE — Progress Notes (Signed)
[facility] ADULT ICU REPLACEMENT PROTOCOL   The patient does apply for the [facility] Adult ICU Electrolyte Replacment Protocol based on the criteria listed below:   1. Is GFR >/= 30 ml/min? Yes.    Patient's GFR today is 55 2. Is SCr </= 2? Yes.   Patient's SCr is 1.2 ml/kg/hr 3. Did SCr increase >/= 0.5 in 24 hours? No. 4. Abnormal electrolyte(s): k (3.1),  (),  () 5. Ordered repletion with: protocol 6. If a panic level lab has been reported, has the CCM MD in charge been notified? No..   Physician:    Ronda Fairly A 02/01/2021 3:24 AM

## 2021-02-01 NOTE — Anesthesia Postprocedure Evaluation (Signed)
Anesthesia Post Note  Patient: Kimberly Kelly  Procedure(s) Performed: ESOPHAGOGASTRODUODENOSCOPY (EGD) WITH PROPOFOL     Patient location during evaluation: Endoscopy Anesthesia Type: MAC Level of consciousness: awake and alert Pain management: pain level controlled Vital Signs Assessment: post-procedure vital signs reviewed and stable Respiratory status: spontaneous breathing, nonlabored ventilation, respiratory function stable and patient connected to nasal cannula oxygen Cardiovascular status: stable and blood pressure returned to baseline Postop Assessment: no apparent nausea or vomiting Anesthetic complications: no   No notable events documented.  Last Vitals:  Vitals:   02/01/21 1321 02/01/21 1330  BP: 112/79 126/86  Pulse: (!) 119 (!) 119  Resp: (!) 26 (!) 24  Temp:  37.1 C  SpO2: 100% 100%    Last Pain:  Vitals:   02/01/21 1329  TempSrc:   PainSc: 0-No pain                 Belenda Cruise P Navdeep Halt

## 2021-02-01 NOTE — H&P (Signed)
NAME:  Kimberly Kelly, MRN:  [id number], DOB:  10/08/1970, LOS: 0 ADMISSION DATE:  01/31/2021, CONSULTATION DATE:  02/01/21 REFERRING MD:  Almyra Free, CHIEF COMPLAINT:  Abdominal pain   History of Present Illness:  Ms. Timmons is a 50 yo woman with a hx of anxiety/depression, hx of lung nodules,  recent pancreatitis, here with n/v, generalized abdominal pain, fever.   Vomiting since yesterday.   Took tylenol and zofran today.    Recently hospitalized from 6/7-6/22 at [facility] with post ERCP pancreatitis.   Diarrhea during hospitalization.   ARF during hospitalization - atelectasis, pleural effusions (diuresed) Transaminases , AKI, sinus tach.    Hypotension Gave 1 L NS at [facility] ER.   Cr increasing now to 1.27 AP 250 WBC 16.5  UTI noted on UA, though patient does not have dysuria.   Started heparin at 2050.   Got cefepime, ceftriaxone, flagyl  Phenergan x 3 Zofran x 2 Ativan x 1 at 2030  Pertinent  Medical History  S/p cholecystectomy 2018 S/p ERCP for RUQ pain at [facility] with sphincterotomy, removal of GB sludge.   Normocytic anemia.  Pulm nodules CT chest 4/22 (RLL GG opacity 1.3cm.) Depression/anxiety - lexapro Athritis Urinary retention  Meds: lexapro 10mg , prilosec 40mg , zofran   Significant Hospital Events: Including procedures, antibiotic start and stop dates in addition to other pertinent events     Interim History / Subjective:    Objective   Blood pressure 101/62, pulse (!) 143, temperature 97.8 F (36.6 C), temperature source Oral, resp. rate (!) 28, height 5\' 7"  (1.702 m), weight 82.6 kg, last menstrual period 02/14/2020, SpO2 99 %.        Intake/Output Summary (Last 24 hours) at 02/01/2021 0005 Last data filed at 01/31/2021 2217 Gross per 24 hour  Intake 1200.72 ml  Output --  Net 1200.72 ml   Filed Weights   01/31/21 1536  Weight: 82.6 kg    Examination: General: pleasant, awake, appropriate HENT: NCAT Lungs: CTAB  Cardiovascular: sinus tach   Abdomen: distended, tender diffusely, BS present  Extremities: no edema, no erythema Neuro: appropriate neuro exam grossly  GU:   Resolved Hospital Problem list     Assessment & Plan:  Pancreatitis and pancreatic necrosis, numerous large pancreatic pseudocysts. Worsening complications of pancreatitis.   Original plan was for transfer to [place] from [place] ED but no tele, progressive or ICU beds. Transferred to [place] given her intermittent severe sinus tachycardia and alarming findings on CT abdomen.  Foley placement pending to monitor UOP.  Continue antibiotics (cefepime and flagyl).   GI consulted, will see in AM.  Will need surgical consult in AM, I dont see an emergent need to call tonight.  Blood culture  added Holding of on NG tube for now as patient is not actively vomiting.  Discussed with GI.    Coffee ground emesis: no hx of GI bleeding.  + hx of GERD.  GI consulted and aware.  Unclear if developed GIB after or prior to heparin initiation.  Protonix gtt, octreotide.  Holding heparin for PVT for now.  Follow up repeat h/H.   AKI: monitor.  Receiving fluids.   UTI: asymptomatic.  Received CTX in ED.   Portal Vein thrombosis: holding heparin for now in setting of GI bleeding.    Pulm Nodules  Anxiety/depression: on lexapro  Best Practice (right click and "Reselect all SmartList Selections" daily)   Diet/type: NPO DVT prophylaxis: other GI prophylaxis: PPI Lines: N/A Foley:  N/A Code Status:  full code Last date of multidisciplinary goals of care discussion []   Labs   CBC: Recent Labs  Lab 01/31/21 1539  WBC 16.5*  NEUTROABS 14.7*  HGB 12.0  HCT 36.3  MCV 86.2  PLT 161    Basic Metabolic Panel: Recent Labs  Lab 01/31/21 1539  NA 138  K 3.7  CL 99  CO2 28  GLUCOSE 140*  BUN 14  CREATININE 1.27*  CALCIUM 9.7   GFR: Estimated Creatinine Clearance: 58.6 mL/min (A) (by C-G formula based on SCr of 1.27 mg/dL (H)). Recent Labs  Lab  01/31/21 1539  WBC 16.5*    Liver Function Tests: Recent Labs  Lab 01/31/21 1539  AST 36  ALT 61*  ALKPHOS 250*  BILITOT 0.7  PROT 7.4  ALBUMIN 3.6   Recent Labs  Lab 01/31/21 1539  LIPASE 83*   No results for input(s): AMMONIA in the last 168 hours.  ABG No results found for: PHART, PCO2ART, PO2ART, HCO3, TCO2, ACIDBASEDEF, O2SAT   Coagulation Profile: Recent Labs  Lab 01/31/21 2224  INR 1.2    Cardiac Enzymes: No results for input(s): CKTOTAL, CKMB, CKMBINDEX, TROPONINI in the last 168 hours.  HbA1C: Hgb A1c MFr Bld  Date/Time Value Ref Range Status  01/15/2021 02:56 AM 5.3 4.8 - 5.6 % Final    Comment:    (NOTE)         Prediabetes: 5.7 - 6.4         Diabetes: >6.4         Glycemic control for adults with diabetes: <7.0     CBG: No results for input(s): GLUCAP in the last 168 hours.  CXR IMPRESSION: Decreased size of small left pleural effusion and basilar atelectasis.  CT abdomen pelvis:   IMPRESSION:   New nonocclusive thrombus in distal main and proximal left portal veins.   Mild central mesenteric lymphadenopathy, likely reactive in etiology.    Review of Systems:     Past Medical History:  She,  has a past medical history of Arthritis.   Surgical History:   Past Surgical History:  Procedure Laterality Date   CESAREAN SECTION     CHOLECYSTECTOMY N/A 10/10/2016   Procedure: LAPAROSCOPIC CHOLECYSTECTOMY WITH INTRAOPERATIVE CHOLANGIOGRAM;  Surgeon: Erroll Luna, MD;  Location: [place];  Service: General;  Laterality: N/A;   SHOULDER ARTHROSCOPY     TUBAL LIGATION       Social History:   reports that she has never smoked. She has never used smokeless tobacco. She reports current alcohol use of about 7.0 standard drinks of alcohol per week. She reports that she does not use drugs.   Family History:  Her family history includes Breast cancer in her paternal grandmother.   Allergies No Known Allergies   Home Medications   Prior to Admission medications   Medication Sig Start Date End Date Taking? Authorizing Provider  escitalopram (LEXAPRO) 10 MG tablet Take 10 mg by mouth daily. 08/13/20   [provider]  omeprazole (PRILOSEC) 40 MG capsule Take 1 capsule (40 mg total) by mouth daily. 01/22/21 02/21/21  Darliss Cheney, MD  ondansetron (ZOFRAN ODT) 4 MG disintegrating tablet Take 1 tablet (4 mg total) by mouth every 8 (eight) hours as needed for nausea or vomiting. 01/22/21   Darliss Cheney, MD     Critical care time: 60 minutes

## 2021-02-01 NOTE — Consult Note (Signed)
Reason for Consult: Abnormal CT hematemesis Referring Physician: ICU team  Kimberly Kelly is an 50 y.o. female.  HPI: Patient seen and examined and case discussed with her mother and her husband and I had multiple conversations with the hospital team and the ICU team and patient familiar to me from previous hospital stay where she had ERCP induced pancreatitis and a procedure done at [facility] and she had been doing okay at home for about 9 days until she had increased nausea and mild discomfort and low-grade fever and when vomiting increased she presented to the emergency room and CT showed a significant cyst as well as some necrosis and portal vein thrombosis and we are consulted for further work-up and plans although the initial plan was to start heparin she did have some coffee-ground emesis in the ER although she denies any signs of bleeding or any black stools at home and she has no other complaints  Past Medical History:  Diagnosis Date   Arthritis     Past Surgical History:  Procedure Laterality Date   CESAREAN SECTION     CHOLECYSTECTOMY N/A 10/10/2016   Procedure: LAPAROSCOPIC CHOLECYSTECTOMY WITH INTRAOPERATIVE CHOLANGIOGRAM;  Surgeon: Erroll Luna, MD;  Location: [facility] OR;  Service: General;  Laterality: N/A;   SHOULDER ARTHROSCOPY     TUBAL LIGATION      Family History  Problem Relation Age of Onset   Breast cancer Paternal Grandmother     Social History:  reports that she has never smoked. She has never used smokeless tobacco. She reports current alcohol use of about 7.0 standard drinks of alcohol per week. She reports that she does not use drugs.  Allergies: No Known Allergies  Medications: I have reviewed the patient's current medications.  Results for orders placed or performed during the hospital encounter of 01/31/21 (from the past 48 hour(s))  CBC with Differential     Status: Abnormal   Collection Time: 01/31/21  3:39 PM  Result Value Ref Range   WBC 16.5 (H) 4.0 -  10.5 K/uL   RBC 4.21 3.87 - 5.11 MIL/uL   Hemoglobin 12.0 12.0 - 15.0 g/dL   HCT 36.3 36.0 - 46.0 %   MCV 86.2 80.0 - 100.0 fL   MCH 28.5 26.0 - 34.0 pg   MCHC 33.1 30.0 - 36.0 g/dL   RDW 13.2 11.5 - 15.5 %   Platelets 277 150 - 400 K/uL   nRBC 0.0 0.0 - 0.2 %   Neutrophils Relative % 89 %   Neutro Abs 14.7 (H) 1.7 - 7.7 K/uL   Lymphocytes Relative 5 %   Lymphs Abs 0.8 0.7 - 4.0 K/uL   Monocytes Relative 5 %   Monocytes Absolute 0.8 0.1 - 1.0 K/uL   Eosinophils Relative 0 %   Eosinophils Absolute 0.0 0.0 - 0.5 K/uL   Basophils Relative 0 %   Basophils Absolute 0.1 0.0 - 0.1 K/uL   Immature Granulocytes 1 %   Abs Immature Granulocytes 0.10 (H) 0.00 - 0.07 K/uL    Comment: Performed at [facility], [place], [place] [address]  Comprehensive metabolic panel     Status: Abnormal   Collection Time: 01/31/21  3:39 PM  Result Value Ref Range   Sodium 138 135 - 145 mmol/L   Potassium 3.7 3.5 - 5.1 mmol/L   Chloride 99 98 - 111 mmol/L   CO2 28 22 - 32 mmol/L   Glucose, Bld 140 (H) 70 - 99 mg/dL  Comment: Glucose reference range applies only to samples taken after fasting for at least 8 hours.   BUN 14 6 - 20 mg/dL   Creatinine, Ser 1.27 (H) 0.44 - 1.00 mg/dL   Calcium 9.7 8.9 - 10.3 mg/dL   Total Protein 7.4 6.5 - 8.1 g/dL   Albumin 3.6 3.5 - 5.0 g/dL   AST 36 15 - 41 U/L   ALT 61 (H) 0 - 44 U/L   Alkaline Phosphatase 250 (H) 38 - 126 U/L   Total Bilirubin 0.7 0.3 - 1.2 mg/dL   GFR, Estimated 52 (L) >60 mL/min    Comment: (NOTE) Calculated using the CKD-EPI Creatinine Equation (2021)    Anion gap 11 5 - 15    Comment: Performed at [facility], [place], [place] [address]  Lipase, blood     Status: Abnormal   Collection Time: 01/31/21  3:39 PM  Result Value Ref Range   Lipase 83 (H) 11 - 51 U/L    Comment: Performed at [facility], [place], [place] [address]   Urinalysis, Routine w reflex microscopic Urine, Clean Catch     Status: Abnormal   Collection Time: 01/31/21  3:39 PM  Result Value Ref Range   Color, Urine YELLOW YELLOW   APPearance HAZY (A) CLEAR   Specific Gravity, Urine 1.006 1.005 - 1.030   pH 5.5 5.0 - 8.0   Glucose, UA NEGATIVE NEGATIVE mg/dL   Hgb urine dipstick NEGATIVE NEGATIVE   Bilirubin Urine NEGATIVE NEGATIVE   Ketones, ur NEGATIVE NEGATIVE mg/dL   Protein, ur NEGATIVE NEGATIVE mg/dL   Nitrite NEGATIVE NEGATIVE   Leukocytes,Ua LARGE (A) NEGATIVE   RBC / HPF 0-5 0 - 5 RBC/hpf   WBC, UA 21-50 0 - 5 WBC/hpf   Bacteria, UA MANY (A) NONE SEEN   Squamous Epithelial / LPF 0-5 0 - 5    Comment: Performed at [facility], [address], [place], [place] [address]  Resp Panel by RT-PCR (Flu A&B, Covid) Nasopharyngeal Swab     Status: None   Collection Time: 01/31/21  7:47 PM   Specimen: Nasopharyngeal Swab; Nasopharyngeal(NP) swabs in vial transport medium  Result Value Ref Range   SARS Coronavirus 2 by RT PCR NEGATIVE NEGATIVE    Comment: (NOTE) SARS-CoV-2 target nucleic acids are NOT DETECTED.  The SARS-CoV-2 RNA is generally detectable in upper respiratory specimens during the acute phase of infection. The lowest concentration of SARS-CoV-2 viral copies this assay can detect is 138 copies/mL. A negative result does not preclude SARS-Cov-2 infection and should not be used as the sole basis for treatment or other patient management decisions. A negative result may occur with  improper specimen collection/handling, submission of specimen other than nasopharyngeal swab, presence of viral mutation(s) within the areas targeted by this assay, and inadequate number of viral copies(<138 copies/mL). A negative result must be combined with clinical observations, patient history, and epidemiological information. The expected result is Negative.  Fact Sheet for Patients:   EntrepreneurPulse.com.au  Fact Sheet for Healthcare Providers:  IncredibleEmployment.be  This test is no t yet approved or cleared by the [place] FDA and  has been authorized for detection and/or diagnosis of SARS-CoV-2 by FDA under an Emergency Use Authorization (EUA). This EUA will remain  in effect (meaning this test can be used) for the duration of the COVID-19 declaration under Section 564(b)(1) of the Act, 21 U.S.C.section 360bbb-3(b)(1), unless the authorization is terminated  or revoked  sooner.       Influenza A by PCR NEGATIVE NEGATIVE   Influenza B by PCR NEGATIVE NEGATIVE    Comment: (NOTE) The Xpert Xpress SARS-CoV-2/FLU/RSV plus assay is intended as an aid in the diagnosis of influenza from Nasopharyngeal swab specimens and should not be used as a sole basis for treatment. Nasal washings and aspirates are unacceptable for Xpert Xpress SARS-CoV-2/FLU/RSV testing.  Fact Sheet for Patients: EntrepreneurPulse.com.au  Fact Sheet for Healthcare Providers: IncredibleEmployment.be  This test is not yet approved or cleared by the [place] FDA and has been authorized for detection and/or diagnosis of SARS-CoV-2 by FDA under an Emergency Use Authorization (EUA). This EUA will remain in effect (meaning this test can be used) for the duration of the COVID-19 declaration under Section 564(b)(1) of the Act, 21 U.S.C. section 360bbb-3(b)(1), unless the authorization is terminated or revoked.  Performed at [facility], [address], [place], [place] [address]   Protime-INR     Status: None   Collection Time: 01/31/21 10:24 PM  Result Value Ref Range   Prothrombin Time 15.1 11.4 - 15.2 seconds   INR 1.2 0.8 - 1.2    Comment: (NOTE) INR goal varies based on device and disease states. Performed at [facility], [address], [place], [place]  [address]   Glucose, capillary     Status: Abnormal   Collection Time: 02/01/21 12:25 AM  Result Value Ref Range   Glucose-Capillary 139 (H) 70 - 99 mg/dL    Comment: Glucose reference range applies only to samples taken after fasting for at least 8 hours.  MRSA Next Gen by PCR, Nasal     Status: None   Collection Time: 02/01/21 12:26 AM   Specimen: Nasal Mucosa; Nasal Swab  Result Value Ref Range   MRSA by PCR Next Gen NOT DETECTED NOT DETECTED    Comment: (NOTE) The GeneXpert MRSA Assay (FDA approved for NASAL specimens only), is one component of a comprehensive MRSA colonization surveillance program. It is not intended to diagnose MRSA infection nor to guide or monitor treatment for MRSA infections. Test performance is not FDA approved in patients less than 59 years old. Performed at [facility], [place] [address] [place], [place] [address]   CBC with Differential/Platelet     Status: Abnormal   Collection Time: 02/01/21  2:01 AM  Result Value Ref Range   WBC 12.4 (H) 4.0 - 10.5 K/uL   RBC 3.48 (L) 3.87 - 5.11 MIL/uL   Hemoglobin 9.8 (L) 12.0 - 15.0 g/dL   HCT 30.4 (L) 36.0 - 46.0 %   MCV 87.4 80.0 - 100.0 fL   MCH 28.2 26.0 - 34.0 pg   MCHC 32.2 30.0 - 36.0 g/dL   RDW 13.2 11.5 - 15.5 %   Platelets 227 150 - 400 K/uL   nRBC 0.0 0.0 - 0.2 %   Neutrophils Relative % 89 %   Neutro Abs 10.9 (H) 1.7 - 7.7 K/uL   Lymphocytes Relative 6 %   Lymphs Abs 0.8 0.7 - 4.0 K/uL   Monocytes Relative 4 %   Monocytes Absolute 0.5 0.1 - 1.0 K/uL   Eosinophils Relative 0 %   Eosinophils Absolute 0.0 0.0 - 0.5 K/uL   Basophils Relative 0 %   Basophils Absolute 0.0 0.0 - 0.1 K/uL   Immature Granulocytes 1 %   Abs Immature Granulocytes 0.09 (H) 0.00 - 0.07 K/uL    Comment: Performed at [facility], [address] [place], [place] [address]  Basic metabolic panel     Status: Abnormal   Collection Time: 02/01/21  2:01 AM  Result Value Ref Range   Sodium 138 135 - 145 mmol/L    Potassium 3.1 (L) 3.5 - 5.1 mmol/L   Chloride 104 98 - 111 mmol/L   CO2 24 22 - 32 mmol/L   Glucose, Bld 152 (H) 70 - 99 mg/dL    Comment: Glucose reference range applies only to samples taken after fasting for at least 8 hours.   BUN 10 6 - 20 mg/dL   Creatinine, Ser 1.20 (H) 0.44 - 1.00 mg/dL   Calcium 8.3 (L) 8.9 - 10.3 mg/dL   GFR, Estimated 55 (L) >60 mL/min    Comment: (NOTE) Calculated using the CKD-EPI Creatinine Equation (2021)    Anion gap 10 5 - 15    Comment: Performed at [facility] [address] [place], [place] [address]  Lipase, blood     Status: Abnormal   Collection Time: 02/01/21  2:01 AM  Result Value Ref Range   Lipase 74 (H) 11 - 51 U/L    Comment: Performed at [facility] [address], [place],  [address]  Procalcitonin     Status: None   Collection Time: 02/01/21  2:01 AM  Result Value Ref Range   Procalcitonin 17.13 ng/mL    Comment:        Interpretation: PCT >= 10 ng/mL: Important systemic inflammatory response, almost exclusively due to severe bacterial sepsis or septic shock. (NOTE)       Sepsis PCT Algorithm           Lower Respiratory Tract                                      Infection PCT Algorithm    ----------------------------     ----------------------------         PCT < 0.25 ng/mL                PCT < 0.10 ng/mL          Strongly encourage             Strongly discourage   discontinuation of antibiotics    initiation of antibiotics    ----------------------------     -----------------------------       PCT 0.25 - 0.50 ng/mL            PCT 0.10 - 0.25 ng/mL               OR       >80% decrease in PCT            Discourage initiation of                                            antibiotics      Encourage discontinuation           of antibiotics    ----------------------------     -----------------------------         PCT >= 0.50 ng/mL              PCT 0.26 - 0.50 ng/mL  AND       <80%  decrease in PCT             Encourage initiation of                                             antibiotics       Encourage continuation           of antibiotics    ----------------------------     -----------------------------        PCT >= 0.50 ng/mL                  PCT > 0.50 ng/mL               AND         increase in PCT                  Strongly encourage                                      initiation of antibiotics    Strongly encourage escalation           of antibiotics                                     -----------------------------                                           PCT <= 0.25 ng/mL                                                 OR                                        > 80% decrease in PCT                                      Discontinue / Do not initiate                                             antibiotics  Performed at [facility], [place] [address], [place], [place] [address]   Lactic acid, plasma     Status: None   Collection Time: 02/01/21  2:02 AM  Result Value Ref Range   Lactic Acid, Venous 0.8 0.5 - 1.9 mmol/L    Comment: Performed at [facility] [address], [place], [place] [address]  Heparin level (unfractionated)     Status: Abnormal   Collection Time: 02/01/21  2:29 AM  Result Value Ref Range   Heparin Unfractionated 0.10 (L) 0.30 - 0.70 IU/mL  Comment: (NOTE) The clinical reportable range upper limit is being lowered to >1.10 to align with the FDA approved guidance for the current laboratory assay.  If heparin results are below expected values, and patient dosage has  been confirmed, suggest follow up testing of antithrombin III levels. Performed at [facility], [place] [address], [place], [place] [address]   Protime-INR     Status: Abnormal   Collection Time: 02/01/21  4:52 AM  Result Value Ref Range   Prothrombin Time 15.6 (H) 11.4 - 15.2 seconds   INR 1.2 0.8 - 1.2    Comment: (NOTE) INR goal varies  based on device and disease states. Performed at [facility], [place] [address], [place], [place] [address]   CBC     Status: Abnormal   Collection Time: 02/01/21  4:52 AM  Result Value Ref Range   WBC 12.0 (H) 4.0 - 10.5 K/uL   RBC 3.51 (L) 3.87 - 5.11 MIL/uL   Hemoglobin 10.1 (L) 12.0 - 15.0 g/dL   HCT 30.5 (L) 36.0 - 46.0 %   MCV 86.9 80.0 - 100.0 fL   MCH 28.8 26.0 - 34.0 pg   MCHC 33.1 30.0 - 36.0 g/dL   RDW 13.2 11.5 - 15.5 %   Platelets 220 150 - 400 K/uL   nRBC 0.0 0.0 - 0.2 %    Comment: Performed at [facility], [place] [address], [place], [place] [address]  Lactic acid, plasma     Status: None   Collection Time: 02/01/21  4:52 AM  Result Value Ref Range   Lactic Acid, Venous 0.9 0.5 - 1.9 mmol/L    Comment: Performed at [facility] [address], [place], [place] [address]  Basic metabolic panel     Status: Abnormal   Collection Time: 02/01/21  4:52 AM  Result Value Ref Range   Sodium 141 135 - 145 mmol/L   Potassium 3.7 3.5 - 5.1 mmol/L   Chloride 104 98 - 111 mmol/L   CO2 24 22 - 32 mmol/L   Glucose, Bld 174 (H) 70 - 99 mg/dL    Comment: Glucose reference range applies only to samples taken after fasting for at least 8 hours.   BUN 10 6 - 20 mg/dL   Creatinine, Ser 1.19 (H) 0.44 - 1.00 mg/dL   Calcium 8.4 (L) 8.9 - 10.3 mg/dL   GFR, Estimated 56 (L) >60 mL/min    Comment: (NOTE) Calculated using the CKD-EPI Creatinine Equation (2021)    Anion gap 13 5 - 15    Comment: Performed at [facility] [address], [place], [place] [address]  Magnesium     Status: None   Collection Time: 02/01/21  4:52 AM  Result Value Ref Range   Magnesium 1.7 1.7 - 2.4 mg/dL    Comment: Performed at [facility] [address] [place], [place] [address]    CT Abdomen Pelvis W Contrast  Result Date: 01/31/2021 CLINICAL DATA:  Acute abdominal pain and nausea and vomiting. Pancreatitis. EXAM: CT ABDOMEN AND PELVIS WITH CONTRAST TECHNIQUE:  Multidetector CT imaging of the abdomen and pelvis was performed using the standard protocol following bolus administration of intravenous contrast. CONTRAST:  65mL OMNIPAQUE IOHEXOL 300 MG/ML  SOLN COMPARISON:  01/07/2021 FINDINGS: Lower Chest: No acute findings. Hepatobiliary: No hepatic masses identified. Prior cholecystectomy. No evidence of biliary obstruction. Peripancreatic fluid described below compresses the superior mesenteric and main portal veins, and there  is nonocclusive thrombus within the distal main and proximal left portal veins. Pancreas: Diffuse pancreatic necrosis is now seen, with a large rim enhancing fluid collection throughout the region of the pancreas which measures approximately 18.3 x 6.3 cm. This is consistent with a large pseudocyst. There has also been development of multiple smaller rim enhancing fluid collections which extend into the porta hepatis, gastrohepatic ligament, small bowel mesentery and left paracolic gutter, consistent with smaller pseudocysts. Spleen: Within normal limits in size and appearance. Adrenals/Urinary Tract: No masses identified. No evidence of ureteral calculi or hydronephrosis. Stomach/Bowel: No evidence of obstruction, inflammatory process or abnormal fluid collections. Vascular/Lymphatic: Mild lymphadenopathy in the central small bowel mesentery, likely reactive in etiology. Reproductive:  No mass or other significant abnormality. Other: Small amount of free pelvic fluid, without significant change. Musculoskeletal:  No suspicious bone lesions identified. 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 Mild central mesenteric lymphadenopathy, likely reactive in etiology. Electronically Signed   By: Marlaine Hind M.D.   On: 01/31/2021 19:04   DG CHEST PORT 1 VIEW  Result  Date: 02/01/2021 CLINICAL DATA:  Pleural effusion EXAM: PORTABLE CHEST 1 VIEW COMPARISON:  01/18/2019 FINDINGS: Decreased size of small left pleural effusion with basilar atelectasis. Right lung is clear. Normal cardiomediastinal contours. IMPRESSION: Decreased size of small left pleural effusion and basilar atelectasis. Electronically Signed   By: Ulyses Jarred M.D.   On: 02/01/2021 01:06    Review of Systems negative except above Blood pressure 129/90, pulse (!) 107, temperature 98.6 F (37 C), temperature source Axillary, resp. rate (!) 22, height 5\' 7"  (1.702 m), weight 84.7 kg, last menstrual period 02/14/2020, SpO2 100 %. Physical Exam patient looks much better than I expected in good spirits no acute distress exam pertinent for her abdomen being soft rare bowel sounds mildly tender at and all upper quadrant no guarding or rebound CT reviewed labs reviewed BUN and creatinine okay hemoglobin stable  Assessment/Plan: Complications of pancreatitis with seemingly self-limited coffee-ground emesis Plan: The risk benefits and methods of endoscopy to evaluate her stomach and see if it is okay to use heparin was discussed with the patient and her family and will proceed later today with further work-up and plans pending those findings and we discussed cyst drainage which will depend on the development of the capsule and the method to be determined based on urgency going forward although we do not have EUS capabilities this weekend unless I am able to contact Dr. Rush Landmark and he is willing to proceed and I will also find out his availability next week as well otherwise will need a university transfer if needed urgently  [place] E 02/01/2021, 9:01 AM

## 2021-02-01 NOTE — Progress Notes (Signed)
ANTICOAGULATION CONSULT NOTE   Pharmacy Consult for Heparin Indication:  Portal vein thrombus  No Known Allergies  Patient Measurements: Height: 5\' 7"  (170.2 cm) Weight: 84.7 kg (186 lb 11.7 oz) IBW/kg (Calculated) : 61.6 Heparin Dosing Weight: 78.7 kg  Vital Signs: Temp: 98.8 F (37.1 C) (07/02 2000) Temp Source: Oral (07/02 2000) BP: 126/81 (07/02 2000) Pulse Rate: 124 (07/02 2000)  Labs: Recent Labs    01/31/21 1539 01/31/21 2224 02/01/21 0201 02/01/21 0229 02/01/21 0452 02/01/21 2156  HGB 12.0  --  9.8*  --  10.1*  --   HCT 36.3  --  30.4*  --  30.5*  --   PLT 277  --  227  --  220  --   LABPROT  --  15.1  --   --  15.6*  --   INR  --  1.2  --   --  1.2  --   HEPARINUNFRC  --   --   --  0.10*  --  0.11*  CREATININE 1.27*  --  1.20*  --  1.19*  --      Estimated Creatinine Clearance: 63.2 mL/min (A) (by C-G formula based on SCr of 1.19 mg/dL (H)).   Assessment: 54 yof presenting to ED with n/v and abdominal pain. Recent hospitalization in June for pancreatitis.  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  Ok to resume heparin per GI and CCM. Heparin level tonight came back subtherapeutic at 0.11, on 1400 units/hr. No s/sx of bleeding or infusion issues per nursing.   Goal of Therapy:  Heparin level 0.3-0.7 units/ml Monitor platelets by anticoagulation protocol: Yes   Plan:  Increase heparin infusion to 1550 units/hr Order heparin level in 6 hr  Daily hep lvl cbc  Antonietta Jewel, PharmD, [facility] Pharmacist  Phone: [phone] 02/01/2021 10:40 PM  Please check AMION for all [facility] phone numbers After 10:00 PM, call [facility] [phone]

## 2021-02-01 NOTE — H&P (Signed)
NAME:  Kimberly Kelly, MRN:  [id number], DOB:  February 03, 1971, LOS: 0 ADMISSION DATE:  01/31/2021, CONSULTATION DATE:  02/01/21 REFERRING MD:  Almyra Free, CHIEF COMPLAINT:  Abdominal pain   History of Present Illness:  Ms. Carton is a 50 yo woman with a hx of anxiety/depression, hx of lung nodules,  recent pancreatitis, here with n/v, generalized abdominal pain, fever.   Vomiting since yesterday.   Took tylenol and zofran today.    Recently hospitalized from 6/7-6/22 at [facility] with post ERCP pancreatitis.   Diarrhea during hospitalization.   ARF during hospitalization - atelectasis, pleural effusions (diuresed) Transaminases , AKI, sinus tach.    Hypotension Gave 1 L NS at [facility] ER.   Cr increasing now to 1.27 AP 250 WBC 16.5  UTI noted on UA, though patient does not have dysuria.   Started heparin at 2050.   Got cefepime, ceftriaxone, flagyl  Phenergan x 3 Zofran x 2 Ativan x 1 at 2030  Pertinent  Medical History  S/p cholecystectomy 2018 S/p ERCP for RUQ pain at [facility] with sphincterotomy, removal of GB sludge.   Normocytic anemia.  Pulm nodules CT chest 4/22 (RLL GG opacity 1.3cm.) Depression/anxiety - lexapro Athritis Urinary retention  Meds: lexapro 10mg , prilosec 40mg , zofran   Significant Hospital Events: Including procedures, antibiotic start and stop dates in addition to other pertinent events   7/2 admitted, EGD- no source of bleeding seen  Interim History / Subjective:  She has not had ongoing nausea or vomiting since admission.   Objective   Blood pressure 126/86, pulse (!) 119, temperature 98.7 F (37.1 C), resp. rate (!) 24, height 5\' 7"  (1.702 m), weight 84.7 kg, last menstrual period 02/14/2020, SpO2 100 %.        Intake/Output Summary (Last 24 hours) at 02/01/2021 1458 Last data filed at 02/01/2021 1301 Gross per 24 hour  Intake 3343.68 ml  Output 650 ml  Net 2693.68 ml    Filed Weights   02/01/21 0039 02/01/21 0630 02/01/21 1224  Weight: 84.7 kg  84.7 kg 84.7 kg    Examination: General: Healthy appearing middle aged woman lying in bed in NAD HENT: [place]/AT, eyes anicteric Lungs: Breathing comfortably on [place], CTAB, no conversational dyspnea Cardiovascular:  tachycardic, reg rhythm, S1S2 Abdomen: mildly distended, esp RUQ, soft, mildly TTP Extremities: no peripheral edema, no cyanosis Neuro: Awake, alert, answering questions appropriately, moving all extremities.  Derm: Warm, dry, no rashes.  Resolved Hospital Problem list     Assessment & Plan:  Pancreatitis and pancreatic necrosis, numerous large pancreatic pseudocysts> worsening complications of pancreatitis.   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 Although her previous pancreatitis care has been at [facility], there were no beds available.  -change antibiotics to meropenem -IVF resuscitation, foley for strict I/Os -follow blood cultures until finalized -pain control with morphine; can add oxycodone PRN -Appreciate GI's input. May require additional endoscopy to drain pseudocyst-- will likely be next week if this will be feasible.  Coffee ground emesis: no hx of GI bleeding.  + hx of GERD.  On EGD: moderately severe esophagitis, one superficial esophageal ulcer. Diffuse mild inflammation due to vascular congestion in stomach. Extrinsic compression of stomach by psudeocyst. -heparin on hold before EGD; resumed after EGD -clear liquid diet after EGD -Protonix gtt -d/c octreotide.   Hyperglycemia, concern for endocrine pancreatic dysfunction -SSI PRN -goal BG 140-180 if requiring insulin  Mild AKI  -con't to monitor -oral repletion; previously received several liters IVF  UTI:  asymptomatic.  -on broad antibiotics already  Portal Vein thrombosis:  -resume heparin after EGD, monitor for bleeding    Pulm Nodules- GG -OP follow up  Anxiety/depression -con't PTA lexapro  Acute anemia, likely due to acute illness with recent pancreatitis over  the past month. -transfuse for Hb<7 or hemodynamically significant bleeding   Patient and husband updated at bedside today.  Best Practice (right click and "Reselect all SmartList Selections" daily)   Diet/type: clear liquids DVT prophylaxis: systemic heparin GI prophylaxis: PPI Lines: N/A Foley:  N/A Code Status:  full code Last date of multidisciplinary goals of care discussion [ ]   Labs   CBC: Recent Labs  Lab 01/31/21 1539 02/01/21 0201 02/01/21 0452  WBC 16.5* 12.4* 12.0*  NEUTROABS 14.7* 10.9*  --   HGB 12.0 9.8* 10.1*  HCT 36.3 30.4* 30.5*  MCV 86.2 87.4 86.9  PLT 277 227 220     Basic Metabolic Panel: Recent Labs  Lab 01/31/21 1539 02/01/21 0201 02/01/21 0452  NA 138 138 141  K 3.7 3.1* 3.7  CL 99 104 104  CO2 28 24 24   GLUCOSE 140* 152* 174*  BUN 14 10 10   CREATININE 1.27* 1.20* 1.19*  CALCIUM 9.7 8.3* 8.4*  MG  --   --  1.7    GFR: Estimated Creatinine Clearance: 63.2 mL/min (A) (by C-G formula based on SCr of 1.19 mg/dL (H)). Recent Labs  Lab 01/31/21 1539 02/01/21 0201 02/01/21 0202 02/01/21 0452  PROCALCITON  --  17.13  --   --   WBC 16.5* 12.4*  --  12.0*  LATICACIDVEN  --   --  0.8 0.9     Liver Function Tests: Recent Labs  Lab 01/31/21 1539  AST 36  ALT 61*  ALKPHOS 250*  BILITOT 0.7  PROT 7.4  ALBUMIN 3.6     Julian Hy, DO 02/01/21 3:14 PM [facility]

## 2021-02-01 NOTE — Progress Notes (Signed)
Pharmacy Antibiotic Note  Kimberly Kelly is a 50 y.o. female admitted on 01/31/2021 with  necrotizing pancreatitis with pseudocysts .  Pharmacy has been consulted for Cefepime dosing. WBC elevated. Scr 1.27. May go to [facility] soon.   Plan: Cefepime 2g IV q12h Flagyl per MD Trend WBC, temp, renal function  F/U infectious work-up   Height: 5\' 7"  (170.2 cm) Weight: 84.7 kg (186 lb 11.7 oz) IBW/kg (Calculated) : 61.6  Temp (24hrs), Avg:98.1 F (36.7 C), Min:97.8 F (36.6 C), Max:98.6 F (37 C)  Recent Labs  Lab 01/31/21 1539  WBC 16.5*  CREATININE 1.27*    Estimated Creatinine Clearance: 59.2 mL/min (A) (by C-G formula based on SCr of 1.27 mg/dL (H)).    No Known Allergies  Narda Bonds, PharmD, BCPS Clinical Pharmacist Phone: [phone]

## 2021-02-01 NOTE — Anesthesia Procedure Notes (Addendum)
Procedure Name: MAC Date/Time: 02/01/2021 12:40 PM Performed by: Leonor Liv, CRNA Pre-anesthesia Checklist: Patient identified, Emergency Drugs available, Suction available, Patient being monitored and Timeout performed Patient Re-evaluated:Patient Re-evaluated prior to induction Oxygen Delivery Method: Nasal cannula Airway Equipment and Method: Bite block Placement Confirmation: positive ETCO2 Dental Injury: Teeth and Oropharynx as per pre-operative assessment

## 2021-02-02 DIAGNOSIS — D649 Anemia, unspecified: Secondary | ICD-10-CM

## 2021-02-02 DIAGNOSIS — K209 Esophagitis, unspecified without bleeding: Secondary | ICD-10-CM

## 2021-02-02 DIAGNOSIS — R6520 Severe sepsis without septic shock: Secondary | ICD-10-CM

## 2021-02-02 DIAGNOSIS — A419 Sepsis, unspecified organism: Principal | ICD-10-CM

## 2021-02-02 LAB — CBC
HCT: 28.9 % — ABNORMAL LOW (ref 36.0–46.0)
Hemoglobin: 9.4 g/dL — ABNORMAL LOW (ref 12.0–15.0)
MCH: 28.5 pg (ref 26.0–34.0)
MCHC: 32.5 g/dL (ref 30.0–36.0)
MCV: 87.6 fL (ref 80.0–100.0)
Platelets: 205 10*3/uL (ref 150–400)
RBC: 3.3 MIL/uL — ABNORMAL LOW (ref 3.87–5.11)
RDW: 13.3 % (ref 11.5–15.5)
WBC: 8.8 10*3/uL (ref 4.0–10.5)
nRBC: 0 % (ref 0.0–0.2)

## 2021-02-02 LAB — GLUCOSE, CAPILLARY
Glucose-Capillary: 111 mg/dL — ABNORMAL HIGH (ref 70–99)
Glucose-Capillary: 116 mg/dL — ABNORMAL HIGH (ref 70–99)
Glucose-Capillary: 130 mg/dL — ABNORMAL HIGH (ref 70–99)
Glucose-Capillary: 130 mg/dL — ABNORMAL HIGH (ref 70–99)
Glucose-Capillary: 131 mg/dL — ABNORMAL HIGH (ref 70–99)
Glucose-Capillary: 136 mg/dL — ABNORMAL HIGH (ref 70–99)
Glucose-Capillary: 173 mg/dL — ABNORMAL HIGH (ref 70–99)

## 2021-02-02 LAB — BASIC METABOLIC PANEL
Anion gap: 12 (ref 5–15)
BUN: 8 mg/dL (ref 6–20)
CO2: 24 mmol/L (ref 22–32)
Calcium: 8.2 mg/dL — ABNORMAL LOW (ref 8.9–10.3)
Chloride: 103 mmol/L (ref 98–111)
Creatinine, Ser: 1.09 mg/dL — ABNORMAL HIGH (ref 0.44–1.00)
GFR, Estimated: >60 mL/min (ref >60)
Glucose, Bld: 128 mg/dL — ABNORMAL HIGH (ref 70–99)
Potassium: 3.9 mmol/L (ref 3.5–5.1)
Sodium: 139 mmol/L (ref 135–145)

## 2021-02-02 LAB — HEPARIN LEVEL (UNFRACTIONATED)
Heparin Unfractionated: <0.1 IU/mL — ABNORMAL LOW (ref 0.30–0.70)
Heparin Unfractionated: 0.12 IU/mL — ABNORMAL LOW (ref 0.30–0.70)
Heparin Unfractionated: 0.17 IU/mL — ABNORMAL LOW (ref 0.30–0.70)

## 2021-02-02 LAB — PROTIME-INR
INR: 1.2 (ref 0.8–1.2)
Prothrombin Time: 15.4 seconds — ABNORMAL HIGH (ref 11.4–15.2)

## 2021-02-02 LAB — MAGNESIUM: Magnesium: 2.1 mg/dL (ref 1.7–2.4)

## 2021-02-02 MED ORDER — PANTOPRAZOLE SODIUM 40 MG IV SOLR
40.0000 mg | Freq: Two times a day (BID) | INTRAVENOUS | Status: DC
  Administered 2021-02-02 – 2021-02-11 (×19): 40 mg via INTRAVENOUS
  Filled 2021-02-02 (×19): qty 40

## 2021-02-02 MED ORDER — SIMETHICONE 80 MG PO CHEW
80.0000 mg | CHEWABLE_TABLET | Freq: Four times a day (QID) | ORAL | Status: DC | PRN
  Administered 2021-02-09: 80 mg via ORAL
  Filled 2021-02-02 (×3): qty 1

## 2021-02-02 NOTE — Progress Notes (Signed)
[place] for Heparin Indication:  Portal vein thrombus  No Known Allergies  Patient Measurements: Height: 5\' 7"  (170.2 cm) Weight: 84.7 kg (186 lb 11.7 oz) IBW/kg (Calculated) : 61.6 Heparin Dosing Weight: 78.7 kg  Vital Signs: Temp: 98.8 F (37.1 C) (07/03 0400) Temp Source: Oral (07/03 0400) BP: 110/74 (07/03 0600) Pulse Rate: 109 (07/03 0600)  Labs: Recent Labs    01/31/21 1539 01/31/21 2224 02/01/21 0201 02/01/21 0229 02/01/21 0452 02/01/21 2156 02/02/21 0723  HGB 12.0  --  9.8*  --  10.1*  --   --   HCT 36.3  --  30.4*  --  30.5*  --   --   PLT 277  --  227  --  220  --   --   LABPROT  --  15.1  --   --  15.6*  --  15.4*  INR  --  1.2  --   --  1.2  --  1.2  HEPARINUNFRC  --   --   --  0.10*  --  0.11* <0.10*  CREATININE 1.27*  --  1.20*  --  1.19*  --  1.09*     Estimated Creatinine Clearance: 69 mL/min (A) (by C-G formula based on SCr of 1.09 mg/dL (H)).   Assessment: 89 yof presenting to ED with n/v and abdominal pain. Recent hospitalization in June for pancreatitis.  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  Hep lvl still remains low   Goal of Therapy:  Heparin level 0.3-0.7 units/ml Monitor platelets by anticoagulation protocol: Yes   Plan:  Increase heparin infusion to 1700 units/hr 1600 HL Daily hep lvl cbc  Barth Kirks, PharmD, BCCCP Clinical Pharmacist [phone]  Please check AMION for all [place] numbers  02/02/2021 8:11 AM

## 2021-02-02 NOTE — Progress Notes (Signed)
[place] for Heparin Indication:  Portal vein thrombus  No Known Allergies  Patient Measurements: Height: 5\' 7"  (170.2 cm) Weight: 84.7 kg (186 lb 11.7 oz) IBW/kg (Calculated) : 61.6 Heparin Dosing Weight: 78.7 kg  Vital Signs: Temp: 98.3 F (36.8 C) (07/03 1530) Temp Source: Oral (07/03 1530) BP: 110/74 (07/03 0600) Pulse Rate: 109 (07/03 0600)  Labs: Recent Labs    01/31/21 2224 02/01/21 0201 02/01/21 0229 02/01/21 0452 02/01/21 2156 02/02/21 0723 02/02/21 1610  HGB  --  9.8*  --  10.1*  --  9.4*  --   HCT  --  30.4*  --  30.5*  --  28.9*  --   PLT  --  227  --  220  --  205  --   LABPROT 15.1  --   --  15.6*  --  15.4*  --   INR 1.2  --   --  1.2  --  1.2  --   HEPARINUNFRC  --   --    < >  --  0.11* <0.10* 0.12*  CREATININE  --  1.20*  --  1.19*  --  1.09*  --    < > = values in this interval not displayed.     Estimated Creatinine Clearance: 69 mL/min (A) (by C-G formula based on SCr of 1.09 mg/dL (H)).   Assessment: 89 yof presenting to ED with n/v and abdominal pain. Recent hospitalization in June for pancreatitis.  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  Heparin level still subtherapeutic at 0.12, on 1700 units/hr. Hgb 9.4, plt 205. No s/sx of bleeding or infusion issues.   Goal of Therapy:  Heparin level 0.3-0.7 units/ml Monitor platelets by anticoagulation protocol: Yes   Plan:  Increase heparin infusion to 1900 units/hr Order heparin level in 6 hours  Daily HL, CBC, and for s/sx of bleeding  Antonietta Jewel, PharmD, [place] Pharmacist  Phone: [phone] 02/02/2021 4:37 PM  Please check AMION for all [place] phone numbers After 10:00 PM, call [place] [phone]

## 2021-02-02 NOTE — Progress Notes (Signed)
Remi Haggard 12:19 PM  Subjective: Patient seen and examined and case discussed with the patient's husband and her nurse and she had been doing great until about 15 minutes ago when she had increased pain and nausea not necessarily related to drinking but she had been walking in the halls and had no problems from her endoscopy and we answered all of their questions  Objective: No acute distress slight increased heart rate abdomen is soft slightly tender no guarding or rebound chemistries okay White count okay slight decreased hemoglobin  Assessment: Pancreatitis complicated with pseudocyst portal vein thrombosis some necrosis  Plan: This may just be due to reflux esophageal spasm and we discussed her medicine she is on and we may want to add some liquid Carafate 4 times a day and I talked to Dr. Rush Landmark who will be back in town on Wednesday and will review her case at that point and then we can decide the timing of possible drainage if he is able and willing to proceed otherwise I will check on tomorrow and call me sooner as needed  [facility]  office [phone] After 5PM or if no answer call [phone]

## 2021-02-02 NOTE — Progress Notes (Signed)
ANTICOAGULATION CONSULT NOTE  Pharmacy Consult for Heparin Indication:  Portal vein thrombus  No Known Allergies  Patient Measurements: Height: 5\' 7"  (170.2 cm) Weight: 84.7 kg (186 lb 11.7 oz) IBW/kg (Calculated) : 61.6 Heparin Dosing Weight: 78.7 kg  Vital Signs: Temp: 97.8 F (36.6 C) (07/03 2259) Temp Source: Oral (07/03 2259) BP: 133/88 (07/03 2259) Pulse Rate: 118 (07/03 2259)  Labs: Recent Labs    01/31/21 2224 02/01/21 0201 02/01/21 0229 02/01/21 0452 02/01/21 2156 02/02/21 0723 02/02/21 1610 02/02/21 2303  HGB  --  9.8*  --  10.1*  --  9.4*  --   --   HCT  --  30.4*  --  30.5*  --  28.9*  --   --   PLT  --  227  --  220  --  205  --   --   LABPROT 15.1  --   --  15.6*  --  15.4*  --   --   INR 1.2  --   --  1.2  --  1.2  --   --   HEPARINUNFRC  --   --    < >  --    < > <0.10* 0.12* 0.17*  CREATININE  --  1.20*  --  1.19*  --  1.09*  --   --    < > = values in this interval not displayed.     Estimated Creatinine Clearance: 69 mL/min (A) (by C-G formula based on SCr of 1.09 mg/dL (H)).   Assessment: 50 yo female with new nonocclusive thrombus in distal main and proximal left portal veins of CT, for heparin  Heparin level still subtherapeutic at 0.12, on 1700 units/hr. Hgb 9.4, plt 205. No s/sx of bleeding or infusion issues.   Goal of Therapy:  Heparin level 0.3-0.7 units/ml Monitor platelets by anticoagulation protocol: Yes   Plan:  Increase Heparin 2100 units/hr Follow-up am labs.  Phillis Knack, PharmD, BCPS

## 2021-02-02 NOTE — Progress Notes (Addendum)
NAME:  Kimberly Kelly, MRN:  [id number], DOB:  [birth year], LOS: 1 ADMISSION DATE:  01/31/2021, CONSULTATION DATE:  02/01/21 REFERRING MD:  Almyra Free, CHIEF COMPLAINT:  Abdominal pain   History of Present Illness:  Ms. Kimberly Kelly is a 50 yo woman with a hx of anxiety/depression, hx of lung nodules,  recent pancreatitis, here with n/v, generalized abdominal pain, fever.   Vomiting since yesterday.   Took tylenol and zofran today.    Recently hospitalized from 6/7-6/22 at [facility] with post ERCP pancreatitis.   Diarrhea during hospitalization.   ARF during hospitalization - atelectasis, pleural effusions (diuresed) Transaminases , AKI, sinus tach.    Hypotension Gave 1 L NS at [facility] ER.   Cr increasing now to 1.27 AP 250 WBC 16.5  UTI noted on UA, though patient does not have dysuria.   Started heparin at 2050.   Got cefepime, ceftriaxone, flagyl  Phenergan x 3 Zofran x 2 Ativan x 1 at 2030  Pertinent  Medical History  S/p cholecystectomy 2018 S/p ERCP for RUQ pain at [facility] with sphincterotomy, removal of GB sludge.   Normocytic anemia.  Pulm nodules CT chest 4/22 (RLL GG opacity 1.3cm.) Depression/anxiety - lexapro Athritis Urinary retention  Meds: lexapro 10mg , prilosec 40mg , zofran   Significant Hospital Events: Including procedures, antibiotic start and stop dates in addition to other pertinent events   7/2 admitted, EGD- no source of bleeding seen  Interim History / Subjective:  One episode of nausea last night before oxycodone. Taking zofran with it helped.   Objective   Blood pressure 110/74, pulse (!) 109, temperature 98.8 F (37.1 C), temperature source Oral, resp. rate (!) 24, height 5\' 7"  (1.702 m), weight 84.7 kg, last menstrual period 02/14/2020, SpO2 98 %.        Intake/Output Summary (Last 24 hours) at 02/02/2021 0716 Last data filed at 02/02/2021 0400 Gross per 24 hour  Intake 1427.23 ml  Output 300 ml  Net 1127.23 ml    Filed Weights   02/01/21 0630  02/01/21 1224 02/02/21 0415  Weight: 84.7 kg 84.7 kg 84.7 kg    Examination: General: well appearing middle aged woman lying in bed in NAD HENT: [place]/AT, eyes anicteric Lungs: CTAB, breathing comfortably on RA Cardiovascular:  S1S2, RRR Abdomen: mildly distended, R>L, mild TTP, soft Extremities: no edema, clubbing, or cyanosis Neuro: awake, alert, moving all extremities well, normal gait Derm: warm,dry  Labs reviewed. Cultures 7/2 pending.  Resolved Hospital Problem list     Assessment & Plan:  Pancreatitis and pancreatic necrosis, numerous large pancreatic pseudocysts> complications of pancreatitis.   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 Although her previous pancreatitis care has been at [facility], there were no beds available.  -con't meropenem -follow blood cultures until finalized -pain control with morphine IV & oxycodone PRN. Recommend zofran with oxycodone -Appreciate GI's input. May require additional endoscopy to drain pseudocyst-- will likely be next week if this will be feasible. Will need to time anticoagulation disruption around this.  Sepsis due to presumed infected pancreatic pseudocyst  -con't meropenem  Coffee ground emesis: no hx of GI bleeding.  + hx of GERD.  On EGD: moderately severe esophagitis, one superficial esophageal ulcer. Diffuse mild inflammation due to vascular congestion in stomach. Extrinsic compression of stomach by psudeocyst. -ok to con't heparin -clear liquid diet after EGD; advance per GI -Protonix IV BID   Hyperglycemia, concern for endocrine pancreatic dysfunction -SSI PRN -goal BG 140-180 if requiring insulin -needs OP  monitoring- at risk for DM from pancreatic destruction  Mild AKI, improving -con't to monitor -oral hydration -renally dose meds, avoid nephrotoxic meds  UTI: asymptomatic.  -on broad antibiotics already  Portal Vein thrombosis -con't heparin -will need 3-6 months of therapy, likely a  DOAC would be preferred agent. Defer to GI,.  Pulm Nodules- GG -OP follow up as previously scheduled  Anxiety/depression -con't PTA lexapro  Acute anemia, likely due to acute illness with recent pancreatitis over the past month. -transfuse for Hb<7 or hemodynamically significant bleeding   Patient and husband updated at bedside 7/3. Stable to transfer to med tele floor. TRH to assume care tomorrow.  Best Practice (right click and "Reselect all SmartList Selections" daily)   Diet/type: clear liquids DVT prophylaxis: systemic heparin GI prophylaxis: PPI Lines: N/A Foley:  N/A Code Status:  full code Last date of multidisciplinary goals of care discussion [ ]   Labs   CBC: Recent Labs  Lab 01/31/21 1539 02/01/21 0201 02/01/21 0452  WBC 16.5* 12.4* 12.0*  NEUTROABS 14.7* 10.9*  --   HGB 12.0 9.8* 10.1*  HCT 36.3 30.4* 30.5*  MCV 86.2 87.4 86.9  PLT 277 227 220     Basic Metabolic Panel: Recent Labs  Lab 01/31/21 1539 02/01/21 0201 02/01/21 0452  NA 138 138 141  K 3.7 3.1* 3.7  CL 99 104 104  CO2 28 24 24   GLUCOSE 140* 152* 174*  BUN 14 10 10   CREATININE 1.27* 1.20* 1.19*  CALCIUM 9.7 8.3* 8.4*  MG  --   --  1.7    GFR: Estimated Creatinine Clearance: 63.2 mL/min (A) (by C-G formula based on SCr of 1.19 mg/dL (H)). Recent Labs  Lab 01/31/21 1539 02/01/21 0201 02/01/21 0202 02/01/21 0452  PROCALCITON  --  17.13  --   --   WBC 16.5* 12.4*  --  12.0*  LATICACIDVEN  --   --  0.8 0.9     Liver Function Tests: Recent Labs  Lab 01/31/21 1539  AST 36  ALT 61*  ALKPHOS 250*  BILITOT 0.7  PROT 7.4  ALBUMIN 3.6     Julian Hy, DO 02/02/21 8:41 AM [facility]

## 2021-02-03 DIAGNOSIS — K8582 Other acute pancreatitis with infected necrosis: Secondary | ICD-10-CM

## 2021-02-03 LAB — CBC
HCT: 31 % — ABNORMAL LOW (ref 36.0–46.0)
Hemoglobin: 10 g/dL — ABNORMAL LOW (ref 12.0–15.0)
MCH: 28.5 pg (ref 26.0–34.0)
MCHC: 32.3 g/dL (ref 30.0–36.0)
MCV: 88.3 fL (ref 80.0–100.0)
Platelets: 278 10*3/uL (ref 150–400)
RBC: 3.51 MIL/uL — ABNORMAL LOW (ref 3.87–5.11)
RDW: 13.2 % (ref 11.5–15.5)
WBC: 6.9 10*3/uL (ref 4.0–10.5)
nRBC: 0 % (ref 0.0–0.2)

## 2021-02-03 LAB — PROTIME-INR
INR: 1.1 (ref 0.8–1.2)
Prothrombin Time: 14.4 seconds (ref 11.4–15.2)

## 2021-02-03 LAB — BASIC METABOLIC PANEL
Anion gap: 10 (ref 5–15)
BUN: 6 mg/dL (ref 6–20)
CO2: 26 mmol/L (ref 22–32)
Calcium: 8.3 mg/dL — ABNORMAL LOW (ref 8.9–10.3)
Chloride: 103 mmol/L (ref 98–111)
Creatinine, Ser: 1.03 mg/dL — ABNORMAL HIGH (ref 0.44–1.00)
GFR, Estimated: >60 mL/min (ref >60)
Glucose, Bld: 114 mg/dL — ABNORMAL HIGH (ref 70–99)
Potassium: 3.6 mmol/L (ref 3.5–5.1)
Sodium: 139 mmol/L (ref 135–145)

## 2021-02-03 LAB — GLUCOSE, CAPILLARY
Glucose-Capillary: 123 mg/dL — ABNORMAL HIGH (ref 70–99)
Glucose-Capillary: 112 mg/dL — ABNORMAL HIGH (ref 70–99)
Glucose-Capillary: 116 mg/dL — ABNORMAL HIGH (ref 70–99)
Glucose-Capillary: 108 mg/dL — ABNORMAL HIGH (ref 70–99)
Glucose-Capillary: 113 mg/dL — ABNORMAL HIGH (ref 70–99)
Glucose-Capillary: 107 mg/dL — ABNORMAL HIGH (ref 70–99)

## 2021-02-03 LAB — HEPARIN LEVEL (UNFRACTIONATED): Heparin Unfractionated: 0.27 IU/mL — ABNORMAL LOW (ref 0.30–0.70)

## 2021-02-03 NOTE — Progress Notes (Addendum)
Kimberly Kelly 9:10 AM  Subjective: Patient doing well this morning and we can answered all of her and her husband's questions regarding cyst drainage and we talked about her diet and answered all of her questions and she did move her bowels a little bit yesterday and still is using some pain medicine which helps  Objective: Vital signs stable afebrile no acute distress abdomen is mildly tender in the midepigastric area nontender lower labs stable  Assessment: Complications of pancreatitis  Plan: After our discussion she is willing to try a full liquid diet but will change back to clear liquids if it increases her pain and otherwise we will wait on Dr. Donneta Romberg opinion on Wednesday and will recheck liver test tomorrow  [facility]  office [phone] After 5PM or if no answer call [phone]

## 2021-02-03 NOTE — Progress Notes (Signed)
ANTICOAGULATION CONSULT NOTE  Pharmacy Consult for Heparin Indication:  Portal vein thrombus  No Known Allergies  Patient Measurements: Height: 5\' 7"  (170.2 cm) Weight: 86.7 kg (191 lb 2.2 oz) IBW/kg (Calculated) : 61.6 Heparin Dosing Weight: 78.7 kg  Vital Signs: Temp: 98.7 F (37.1 C) (07/04 0441) Temp Source: Oral (07/04 0441) BP: 127/73 (07/04 0441) Pulse Rate: 114 (07/04 0441)  Labs: Recent Labs    02/01/21 0452 02/01/21 2156 02/02/21 0723 02/02/21 1610 02/02/21 2303 02/03/21 0755  HGB 10.1*  --  9.4*  --   --  10.0*  HCT 30.5*  --  28.9*  --   --  31.0*  PLT 220  --  205  --   --  278  LABPROT 15.6*  --  15.4*  --   --  14.4  INR 1.2  --  1.2  --   --  1.1  HEPARINUNFRC  --    < > <0.10* 0.12* 0.17* 0.27*  CREATININE 1.19*  --  1.09*  --   --  1.03*   < > = values in this interval not displayed.     Estimated Creatinine Clearance: 73.9 mL/min (A) (by C-G formula based on SCr of 1.03 mg/dL (H)).   Assessment: 50 yo female with new nonocclusive thrombus in distal main and proximal left portal veins of CT, for heparin  Heparin level 0.27  Goal of Therapy:  Heparin level 0.3-0.7 units/ml Monitor platelets by anticoagulation protocol: Yes   Plan:  Increase Heparin 2250 units/hr Follow-up am labs.  Thank you Anette Guarneri, PharmD

## 2021-02-03 NOTE — Progress Notes (Signed)
PROGRESS NOTE    Kimberly Kelly  MLJ:[id number] DOB: 03/11/1971 DOA: 01/31/2021 PCP: [name], FNP   Brief Narrative:  Kimberly Kelly is a 50 yo woman with a hx of anxiety/depression, hx of lung nodules, recent pancreatitis, here with n/v, generalized abdominal pain, fever. Acute intractable nausea a vomiting 24h prior to admission. Took tylenol and zofran with no improvement. Recently hospitalized from 6/7-6/22 at [facility] with post ERCP pancreatitis.   Diarrhea during hospitalization. ARF during hospitalization - atelectasis, pleural effusions (diuresed) Transaminases, AKI, sinus tach.  In ED notable hypotension with leukocytosis, started heparin, cefepime, ceftriaxone, flagyl, phenergan, zofran and ativan - transferred to our facility for GI evaluation and possible intervention given necrotic pancreatitis and large pancreatitc pseudocyst.  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 -Continue meropenem -Blood cultures preliminary negative -pain control with morphine IV & oxycodone PRN. Recommend zofran with oxycodone -GI following, appreciate insight recommendations, tentative plan for endoscopic drainage of pseudocyst on 02/05/2021 per preliminary note   Coffee ground emesis in the setting of GERD without history of GI bleed Acute blood loss anemia, stabilizing On EGD: moderately severe esophagitis, one superficial esophageal ulcer. Diffuse mild inflammation due to vascular congestion in stomach. Extrinsic compression of stomach by psudeocyst. 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 -Covered by meropenem as above  Portal Vein thrombosis -Continue heparin drip -transition to DOAC postoperatively   Incidentally noted pulmonary nodules  -OP follow up as previously scheduled   Anxiety/depression -con't PTA lexapro  DVT prophylaxis: Heparin drip Code Status: Full Family Communication: Kimberly Kelly at bedside  Status is: Inpatient  Dispo: The patient is from: Home              Anticipated d/c is to: Home              Anticipated d/c date is: 72+ hours              Patient currently not medically stable for discharge  Consultants:  GI, PCCM  Procedures:  Tentative endoscopy pending 02/05/2021  Antimicrobials:  Meropenem  Subjective: No acute issues or events overnight, patient tolerating p.o. quite well this morning advancing diet as tolerated denies overt nausea vomiting diarrhea constipation headache fevers chills hematemesis coffee-ground emesis or melena.  Objective: Vitals:   02/02/21 2100 02/02/21 2200 02/02/21 2259 02/03/21 0441  BP: 127/87 121/82 133/88 127/73  Pulse: (!) 107 (!) 118 (!) 118 (!) 114  Resp: 17 13 18 18   Temp:   97.8 F (36.6 C) 98.7 F (37.1 C)  TempSrc:   Oral Oral  SpO2: 99% 97% 95% 98%  Weight:      Height:        Intake/Output Summary (Last 24 hours) at 02/03/2021 0738 Last data filed at 02/02/2021 2300 Gross per 24 hour  Intake 600.92 ml  Output 1 ml  Net 599.92 ml   Filed Weights   02/01/21 0630 02/01/21 1224 02/02/21 0415  Weight: 84.7 kg 84.7 kg 84.7 kg    Examination:  General:  Pleasantly resting in  bed, No acute distress. HEENT:  Normocephalic atraumatic.  Sclerae nonicteric, noninjected.  Extraocular movements intact bilaterally. Neck:  Without mass or deformity.  Trachea is midline. Lungs:  Clear to auscultate bilaterally without rhonchi, wheeze, or rales. Heart:  Regular rate and rhythm.  Without murmurs, rubs, or gallops. Abdomen:  Soft, minimally tender diffusely PMI epigastrium, nondistended.  Without guarding or rebound. Extremities: Without cyanosis, clubbing, edema, or obvious deformity. Vascular:  Dorsalis pedis and posterior tibial pulses palpable bilaterally. Skin:  Warm and dry, no erythema, no ulcerations.  Data Reviewed: I have personally reviewed following labs and imaging studies  CBC: Recent Labs  Lab 01/31/21 1539 02/01/21 0201 02/01/21 0452 02/02/21 0723  WBC 16.5* 12.4* 12.0* 8.8  NEUTROABS 14.7* 10.9*  --   --   HGB 12.0 9.8* 10.1* 9.4*  HCT 36.3 30.4* 30.5* 28.9*  MCV 86.2 87.4 86.9 87.6  PLT 277 227 220 989   Basic Metabolic Panel: Recent Labs  Lab 01/31/21 1539 02/01/21 0201 02/01/21 0452 02/02/21 0723  NA 138 138 141 139  K 3.7 3.1* 3.7 3.9  CL 99 104 104 103  CO2 28 24 24 24   GLUCOSE 140* 152* 174* 128*  BUN 14 10 10 8   CREATININE 1.27* 1.20* 1.19* 1.09*  CALCIUM 9.7 8.3* 8.4* 8.2*  MG  --   --  1.7 2.1   GFR: Estimated Creatinine Clearance: 69 mL/min (A) (by C-G formula based on SCr of 1.09 mg/dL (H)). Liver Function Tests: Recent Labs  Lab 01/31/21 1539  AST 36  ALT 61*  ALKPHOS 250*  BILITOT 0.7  PROT 7.4  ALBUMIN 3.6   Recent Labs  Lab 01/31/21 1539 02/01/21 0201  LIPASE 83* 74*   No results for input(s): AMMONIA in the last 168 hours. Coagulation Profile: Recent Labs  Lab 01/31/21 2224 02/01/21 0452 02/02/21 0723  INR 1.2 1.2 1.2   Cardiac Enzymes: No results for input(s): CKTOTAL, CKMB, CKMBINDEX, TROPONINI in the last 168 hours. BNP (last 3 results) No results for input(s): PROBNP in the last 8760  hours. HbA1C: No results for input(s): HGBA1C in the last 72 hours. CBG: Recent Labs  Lab 02/02/21 1142 02/02/21 1529 02/02/21 1953 02/02/21 2335 02/03/21 0359  GLUCAP 130* 111* 131* 116* 123*   Lipid Profile: No results for input(s): CHOL, HDL, LDLCALC, TRIG, CHOLHDL, LDLDIRECT in the last 72 hours. Thyroid Function Tests: No results for input(s): TSH, T4TOTAL, FREET4, T3FREE, THYROIDAB in the last 72 hours. Anemia Panel: No results for input(s): VITAMINB12, FOLATE, FERRITIN, TIBC, IRON, RETICCTPCT in the last 72 hours. Sepsis Labs: Recent Labs  Lab 02/01/21 0201 02/01/21 0202 02/01/21 0452  PROCALCITON 17.13  --   --   LATICACIDVEN  --  0.8 0.9    Recent Results (from the past 240 hour(s))  Resp Panel by RT-PCR (Flu A&B, Covid) Nasopharyngeal Swab     Status: None   Collection Time: 01/31/21  7:47 PM   Specimen: Nasopharyngeal Swab; Nasopharyngeal(NP) swabs in vial transport medium  Result Value Ref Range Status   SARS Coronavirus 2 by RT PCR NEGATIVE NEGATIVE Final    Comment: (NOTE) SARS-CoV-2 target nucleic acids are NOT DETECTED.  The SARS-CoV-2 RNA is generally detectable in upper respiratory specimens during the acute phase of infection.  The lowest concentration of SARS-CoV-2 viral copies this assay can detect is 138 copies/mL. A negative result does not preclude SARS-Cov-2 infection and should not be used as the sole basis for treatment or other patient management decisions. A negative result may occur with  improper specimen collection/handling, submission of specimen other than nasopharyngeal swab, presence of viral mutation(s) within the areas targeted by this assay, and inadequate number of viral copies(<138 copies/mL). A negative result must be combined with clinical observations, patient history, and epidemiological information. The expected result is Negative.  Fact Sheet for Patients:  EntrepreneurPulse.com.au  Fact Sheet for  Healthcare Providers:  IncredibleEmployment.be  This test is no t yet approved or cleared by the [place] FDA and  has been authorized for detection and/or diagnosis of SARS-CoV-2 by FDA under an Emergency Use Authorization (EUA). This EUA will remain  in effect (meaning this test can be used) for the duration of the COVID-19 declaration under Section 564(b)(1) of the Act, 21 U.S.C.section 360bbb-3(b)(1), unless the authorization is terminated  or revoked sooner.       Influenza A by PCR NEGATIVE NEGATIVE Final   Influenza B by PCR NEGATIVE NEGATIVE Final    Comment: (NOTE) The Xpert Xpress SARS-CoV-2/FLU/RSV plus assay is intended as an aid in the diagnosis of influenza from Nasopharyngeal swab specimens and should not be used as a sole basis for treatment. Nasal washings and aspirates are unacceptable for Xpert Xpress SARS-CoV-2/FLU/RSV testing.  Fact Sheet for Patients: EntrepreneurPulse.com.au  Fact Sheet for Healthcare Providers: IncredibleEmployment.be  This test is not yet approved or cleared by the [place] FDA and has been authorized for detection and/or diagnosis of SARS-CoV-2 by FDA under an Emergency Use Authorization (EUA). This EUA will remain in effect (meaning this test can be used) for the duration of the COVID-19 declaration under Section 564(b)(1) of the Act, 21 U.S.C. section 360bbb-3(b)(1), unless the authorization is terminated or revoked.  Performed at [facility], [address], [place], [place] [address]   MRSA Next Gen by PCR, Nasal     Status: None   Collection Time: 02/01/21 12:26 AM   Specimen: Nasal Mucosa; Nasal Swab  Result Value Ref Range Status   MRSA by PCR Next Gen NOT DETECTED NOT DETECTED Final    Comment: (NOTE) The GeneXpert MRSA Assay (FDA approved for NASAL specimens only), is one component of a comprehensive MRSA colonization  surveillance program. It is not intended to diagnose MRSA infection nor to guide or monitor treatment for MRSA infections. Test performance is not FDA approved in patients less than 58 years old. Performed at [facility], [place] [address], [place], [place] [address]   Culture, blood (routine x 2)     Status: None (Preliminary result)   Collection Time: 02/01/21  2:10 AM   Specimen: BLOOD  Result Value Ref Range Status   Specimen Description BLOOD LEFT ANTECUBITAL  Final   Special Requests   Final    BOTTLES DRAWN AEROBIC AND ANAEROBIC Blood Culture results may not be optimal due to an inadequate volume of blood received in culture bottles   Culture   Final    NO GROWTH 1 DAY Performed at [facility], [place] [address] [place],  [address]    Report Status PENDING  Incomplete  Culture, blood (routine x 2)     Status: None (Preliminary result)   Collection Time: 02/01/21  2:21 AM   Specimen: BLOOD LEFT HAND  Result Value Ref Range  Status   Specimen Description BLOOD LEFT HAND  Final   Special Requests   Final    BOTTLES DRAWN AEROBIC AND ANAEROBIC Blood Culture adequate volume   Culture   Final    NO GROWTH 1 DAY Performed at [facility], [address], [place], [place] [address]    Report Status PENDING  Incomplete    Radiology Studies: No results found.  Scheduled Meds:  Chlorhexidine Gluconate Cloth  6 each Topical Daily   insulin aspart  1-3 Units Subcutaneous Q4H   pantoprazole (PROTONIX) IV  40 mg Intravenous Q12H   Continuous Infusions:  heparin 2,100 Units/hr (02/03/21 0531)   meropenem (MERREM) IV 1 g (02/03/21 0235)   promethazine (PHENERGAN) injection (IM or IVPB)      LOS: 2 days   Time spent: 32min  Aiesha Leland C Johannah Rozas, DO [facility]  If 7PM-7AM, please contact night-coverage [email or web address]  02/03/2021, 7:38 AM

## 2021-02-04 LAB — BASIC METABOLIC PANEL
Anion gap: 7 (ref 5–15)
BUN: <5 mg/dL — ABNORMAL LOW (ref 6–20)
CO2: 29 mmol/L (ref 22–32)
Calcium: 8 mg/dL — ABNORMAL LOW (ref 8.9–10.3)
Chloride: 101 mmol/L (ref 98–111)
Creatinine, Ser: 0.95 mg/dL (ref 0.44–1.00)
GFR, Estimated: >60 mL/min (ref >60)
Glucose, Bld: 121 mg/dL — ABNORMAL HIGH (ref 70–99)
Potassium: 3.1 mmol/L — ABNORMAL LOW (ref 3.5–5.1)
Sodium: 137 mmol/L (ref 135–145)

## 2021-02-04 LAB — HEPATIC FUNCTION PANEL
ALT: 18 U/L (ref 0–44)
AST: 12 U/L — ABNORMAL LOW (ref 15–41)
Albumin: 2.1 g/dL — ABNORMAL LOW (ref 3.5–5.0)
Alkaline Phosphatase: 115 U/L (ref 38–126)
Bilirubin, Direct: 0.2 mg/dL (ref 0.0–0.2)
Indirect Bilirubin: 0.5 mg/dL (ref 0.3–0.9)
Total Bilirubin: 0.7 mg/dL (ref 0.3–1.2)
Total Protein: 5.2 g/dL — ABNORMAL LOW (ref 6.5–8.1)

## 2021-02-04 LAB — CBC
HCT: 26.9 % — ABNORMAL LOW (ref 36.0–46.0)
Hemoglobin: 8.7 g/dL — ABNORMAL LOW (ref 12.0–15.0)
MCH: 28.4 pg (ref 26.0–34.0)
MCHC: 32.3 g/dL (ref 30.0–36.0)
MCV: 87.9 fL (ref 80.0–100.0)
Platelets: 255 10*3/uL (ref 150–400)
RBC: 3.06 MIL/uL — ABNORMAL LOW (ref 3.87–5.11)
RDW: 13.2 % (ref 11.5–15.5)
WBC: 5.3 10*3/uL (ref 4.0–10.5)
nRBC: 0 % (ref 0.0–0.2)

## 2021-02-04 LAB — GLUCOSE, CAPILLARY
Glucose-Capillary: 177 mg/dL — ABNORMAL HIGH (ref 70–99)
Glucose-Capillary: 79 mg/dL (ref 70–99)
Glucose-Capillary: 97 mg/dL (ref 70–99)
Glucose-Capillary: 107 mg/dL — ABNORMAL HIGH (ref 70–99)
Glucose-Capillary: 148 mg/dL — ABNORMAL HIGH (ref 70–99)

## 2021-02-04 LAB — PROTIME-INR
INR: 1.1 (ref 0.8–1.2)
Prothrombin Time: 14.6 seconds (ref 11.4–15.2)

## 2021-02-04 LAB — HEPARIN LEVEL (UNFRACTIONATED): Heparin Unfractionated: 0.21 IU/mL — ABNORMAL LOW (ref 0.30–0.70)

## 2021-02-04 MED ORDER — LACTATED RINGERS IV SOLN: INTRAVENOUS | Status: DC

## 2021-02-04 MED ORDER — POTASSIUM CHLORIDE 10 MEQ/100ML IV SOLN
10.0000 meq | INTRAVENOUS | Status: AC
  Administered 2021-02-04 (×2): 10 meq via INTRAVENOUS
  Filled 2021-02-04 (×3): qty 100

## 2021-02-04 MED ORDER — POTASSIUM CHLORIDE CRYS ER 20 MEQ PO TBCR
20.0000 meq | EXTENDED_RELEASE_TABLET | ORAL | Status: AC
  Administered 2021-02-04 (×2): 20 meq via ORAL
  Filled 2021-02-04 (×2): qty 1

## 2021-02-04 MED ORDER — ENOXAPARIN SODIUM 80 MG/0.8ML IJ SOSY
80.0000 mg | PREFILLED_SYRINGE | Freq: Two times a day (BID) | INTRAMUSCULAR | Status: DC
  Administered 2021-02-04 – 2021-02-06 (×5): 80 mg via SUBCUTANEOUS
  Filled 2021-02-04 (×5): qty 0.8

## 2021-02-04 NOTE — Plan of Care (Signed)

## 2021-02-04 NOTE — Progress Notes (Signed)
PROGRESS NOTE    Kimberly Kelly  KGY:[id number] DOB: 04-15-1971 DOA: 01/31/2021 PCP: [name], FNP   Brief Narrative:  Ms. Sarratt is a 50 yo woman with a hx of anxiety/depression, hx of lung nodules, recent pancreatitis, here with n/v, generalized abdominal pain, fever. Acute intractable nausea a vomiting 24h prior to admission. Took tylenol and zofran with no improvement. Recently hospitalized from 6/7-6/22 at [facility] with post ERCP pancreatitis.   Diarrhea during hospitalization. ARF during hospitalization - atelectasis, pleural effusions (diuresed) Transaminases, AKI, sinus tach.  In ED notable hypotension with leukocytosis, started heparin, cefepime, ceftriaxone, flagyl, phenergan, zofran and ativan - transferred to our facility for GI evaluation and possible intervention given necrotic pancreatitis and large pancreatitc pseudocyst.  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   Non-occlusive thrombus in distal main and proximal left portal veins Sepsis secondary to necrotic pancreatitis, presumed infectious process, POA -Previous pancreatitis care has been at [facility], there were no beds available thus she has transferred here. -Continue meropenem -Blood cultures remain negative -pain control with morphine IV & oxycodone PRN. Recommend zofran with oxycodone -GI following, appreciate insight recommendations, tentative plan for endoscopic drainage of pseudocyst on 02/05/2021 per preliminary note -unclear if she needs to be n.p.o. at midnight for this procedure if this is still the plan   Coffee ground emesis in the setting of GERD without history of GI bleed Acute blood loss anemia, stabilizing On EGD: moderately severe esophagitis, one superficial esophageal ulcer. Diffuse mild inflammation due to vascular congestion in stomach. Extrinsic compression of stomach by  psudeocyst. 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 -Covered by meropenem as above  Portal Vein thrombosis -Transition from heparin drip to Lovenox given IV issues and labile heparin levels -likely transition to DOAC at discharge   Incidentally noted pulmonary nodules  -OP follow up as previously scheduled   Anxiety/depression -con't PTA lexapro  DVT prophylaxis: Lovenox, full dose Code Status: Full Family Communication: Husband at bedside  Status is: Inpatient  Dispo: The patient is from: Home              Anticipated d/c is to: Home              Anticipated d/c date is: 48-72 hours              Patient currently not medically stable for discharge  Consultants:  GI, PCCM  Procedures:  Tentative endoscopy pending 02/05/2021  Antimicrobials:  Meropenem  Subjective: No acute issues or events overnight, abdominal pain improving, some nausea this morning after breakfast but resolved with medications.  Somewhat anxious for evaluation with GI later today and tomorrow but otherwise in good spirits increasing diet and ambulation as tolerated.  Denies chest pain headache fever chills shortness of breath constipation diarrhea  Objective: Vitals:   02/03/21 0500 02/03/21 1611 02/04/21 0458 02/04/21 0500  BP:  124/76 116/74   Pulse:  (!) 107 (!) 106   Resp:  18 18   Temp:  98.5 F (36.9  C) 98.2 F (36.8 C)   TempSrc:      SpO2:  97% 99%   Weight: 86.7 kg   87.9 kg  Height:        Intake/Output Summary (Last 24 hours)  at 02/04/2021 0725 Last data filed at 02/03/2021 1508 Gross per 24 hour  Intake 1047.95 ml  Output 900 ml  Net 147.95 ml    Filed Weights   02/02/21 0415 02/03/21 0500 02/04/21 0500  Weight: 84.7 kg 86.7 kg 87.9 kg    Examination:  General:  Pleasantly resting in bed, No acute distress. HEENT:  Normocephalic atraumatic.  Sclerae nonicteric, noninjected.  Extraocular movements intact bilaterally. Neck:  Without mass or deformity.  Trachea is midline. Lungs:  Clear to auscultate bilaterally without rhonchi, wheeze, or rales. Heart:  Regular rate and rhythm.  Without murmurs, rubs, or gallops. Abdomen:  Soft, minimally tender diffusely PMI epigastrium, nondistended.  Without guarding or rebound. Extremities: Without cyanosis, clubbing, edema, or obvious deformity. Vascular:  Dorsalis pedis and posterior tibial pulses palpable bilaterally. Skin:  Warm and dry, no erythema, no ulcerations.  Data Reviewed: I have personally reviewed following labs and imaging studies  CBC: Recent Labs  Lab 01/31/21 1539 02/01/21 0201 02/01/21 0452 02/02/21 0723 02/03/21 0755 02/04/21 0042  WBC 16.5* 12.4* 12.0* 8.8 6.9 5.3  NEUTROABS 14.7* 10.9*  --   --   --   --   HGB 12.0 9.8* 10.1* 9.4* 10.0* 8.7*  HCT 36.3 30.4* 30.5* 28.9* 31.0* 26.9*  MCV 86.2 87.4 86.9 87.6 88.3 87.9  PLT 277 227 220 205 278 034    Basic Metabolic Panel: Recent Labs  Lab 02/01/21 0201 02/01/21 0452 02/02/21 0723 02/03/21 0755 02/04/21 0042  NA 138 141 139 139 137  K 3.1* 3.7 3.9 3.6 3.1*  CL 104 104 103 103 101  CO2 24 24 24 26 29   GLUCOSE 152* 174* 128* 114* 121*  BUN 10 10 8 6  <5*  CREATININE 1.20* 1.19* 1.09* 1.03* 0.95  CALCIUM 8.3* 8.4* 8.2* 8.3* 8.0*  MG  --  1.7 2.1  --   --     GFR: Estimated Creatinine Clearance: 80.6 mL/min (by C-G formula based on SCr of 0.95 mg/dL). Liver Function Tests: Recent Labs  Lab 01/31/21 1539 02/04/21 0042  AST 36 12*  ALT 61* 18  ALKPHOS 250* 115  BILITOT  0.7 0.7  PROT 7.4 5.2*  ALBUMIN 3.6 2.1*    Recent Labs  Lab 01/31/21 1539 02/01/21 0201  LIPASE 83* 74*    No results for input(s): AMMONIA in the last 168 hours. Coagulation Profile: Recent Labs  Lab 01/31/21 2224 02/01/21 0452 02/02/21 0723 02/03/21 0755 02/04/21 0042  INR 1.2 1.2 1.2 1.1 1.1    Cardiac Enzymes: No results for input(s): CKTOTAL, CKMB, CKMBINDEX, TROPONINI in the last 168 hours. BNP (last 3 results) No results for input(s): PROBNP in the last 8760 hours. HbA1C: No results for input(s): HGBA1C in the last 72 hours. CBG: Recent Labs  Lab 02/03/21 1224 02/03/21 1645 02/03/21 2106 02/03/21 2307 02/04/21 0503  GLUCAP 116* 108* 113* 107* 177*    Lipid Profile: No results for input(s): CHOL, HDL, LDLCALC, TRIG, CHOLHDL, LDLDIRECT in the last 72 hours. Thyroid Function Tests: No results for input(s): TSH, T4TOTAL, FREET4, T3FREE, THYROIDAB in the last 72 hours. Anemia Panel: No results for input(s): VITAMINB12, FOLATE, FERRITIN, TIBC, IRON, RETICCTPCT in the last 72 hours. Sepsis Labs: Recent Labs  Lab 02/01/21 0201 02/01/21 0202 02/01/21 7425  PROCALCITON 17.13  --   --   LATICACIDVEN  --  0.8 0.9     Recent Results (from the past 240 hour(s))  Resp Panel by RT-PCR (Flu A&B, Covid) Nasopharyngeal Swab     Status: None   Collection Time: 01/31/21  7:47 PM   Specimen: Nasopharyngeal Swab; Nasopharyngeal(NP) swabs in vial transport medium  Result Value Ref Range Status   SARS Coronavirus 2 by RT PCR NEGATIVE NEGATIVE Final    Comment: (NOTE) SARS-CoV-2 target nucleic acids are NOT DETECTED.  The SARS-CoV-2 RNA is generally detectable in upper respiratory specimens during the acute phase of infection. The lowest concentration of SARS-CoV-2 viral copies this assay can detect is 138 copies/mL. A negative result does not preclude SARS-Cov-2 infection and should not be used as the sole basis for treatment or other patient management  decisions. A negative result may occur with  improper specimen collection/handling, submission of specimen other than nasopharyngeal swab, presence of viral mutation(s) within the areas targeted by this assay, and inadequate number of viral copies(<138 copies/mL). A negative result must be combined with clinical observations, patient history, and epidemiological information. The expected result is Negative.  Fact Sheet for Patients:  EntrepreneurPulse.com.au  Fact Sheet for Healthcare Providers:  IncredibleEmployment.be  This test is no t yet approved or cleared by the [place] FDA and  has been authorized for detection and/or diagnosis of SARS-CoV-2 by FDA under an Emergency Use Authorization (EUA). This EUA will remain  in effect (meaning this test can be used) for the duration of the COVID-19 declaration under Section 564(b)(1) of the Act, 21 U.S.C.section 360bbb-3(b)(1), unless the authorization is terminated  or revoked sooner.       Influenza A by PCR NEGATIVE NEGATIVE Final   Influenza B by PCR NEGATIVE NEGATIVE Final    Comment: (NOTE) The Xpert Xpress SARS-CoV-2/FLU/RSV plus assay is intended as an aid in the diagnosis of influenza from Nasopharyngeal swab specimens and should not be used as a sole basis for treatment. Nasal washings and aspirates are unacceptable for Xpert Xpress SARS-CoV-2/FLU/RSV testing.  Fact Sheet for Patients: EntrepreneurPulse.com.au  Fact Sheet for Healthcare Providers: IncredibleEmployment.be  This test is not yet approved or cleared by the [place] FDA and has been authorized for detection and/or diagnosis of SARS-CoV-2 by FDA under an Emergency Use Authorization (EUA). This EUA will remain in effect (meaning this test can be used) for the duration of the COVID-19 declaration under Section 564(b)(1) of the Act, 21 U.S.C. section 360bbb-3(b)(1), unless the  authorization is terminated or revoked.  Performed at [facility], [address] [place], [place] [address]   MRSA Next Gen by PCR, Nasal     Status: None   Collection Time: 02/01/21 12:26 AM   Specimen: Nasal Mucosa; Nasal Swab  Result Value Ref Range Status   MRSA by PCR Next Gen NOT DETECTED NOT DETECTED Final    Comment: (NOTE) The GeneXpert MRSA Assay (FDA approved for NASAL specimens only), is one component of a comprehensive MRSA colonization surveillance program. It is not intended to diagnose MRSA infection nor to guide or monitor treatment for MRSA infections. Test performance is not FDA approved in patients less than 47 years old. Performed at [facility], [place] [address] [place], [place] [address]   Culture, blood (routine x 2)     Status: None (Preliminary result)   Collection Time: 02/01/21  2:10 AM   Specimen: BLOOD  Result Value Ref Range Status  Specimen Description BLOOD LEFT ANTECUBITAL  Final   Special Requests   Final    BOTTLES DRAWN AEROBIC AND ANAEROBIC Blood Culture results may not be optimal due to an inadequate volume of blood received in culture bottles   Culture   Final    NO GROWTH 2 DAYS Performed at [facility], [place] [address], [place], [place] [address]    Report Status PENDING  Incomplete  Culture, blood (routine x 2)     Status: None (Preliminary result)   Collection Time: 02/01/21  2:21 AM   Specimen: BLOOD LEFT HAND  Result Value Ref Range Status   Specimen Description BLOOD LEFT HAND  Final   Special Requests   Final    BOTTLES DRAWN AEROBIC AND ANAEROBIC Blood Culture adequate volume   Culture   Final    NO GROWTH 2 DAYS Performed at [facility], [place] [address] [place], [place] [address]    Report Status PENDING  Incomplete     Radiology Studies: No results found.  Scheduled Meds:  Chlorhexidine Gluconate Cloth  6 each Topical Daily   insulin aspart  1-3 Units Subcutaneous Q4H    pantoprazole (PROTONIX) IV  40 mg Intravenous Q12H   potassium chloride  20 mEq Oral Q4H   Continuous Infusions:  heparin 2,250 Units/hr (02/04/21 6811)   meropenem (MERREM) IV 1 g (02/04/21 0300)   potassium chloride 10 mEq (02/04/21 0435)   promethazine (PHENERGAN) injection (IM or IVPB)      LOS: 3 days   Time spent: 27min  Ferron Ishmael C Khushbu Pippen, DO [facility]  If 7PM-7AM, please contact night-coverage [email or web address]  02/04/2021, 7:25 AM

## 2021-02-04 NOTE — Progress Notes (Signed)
[facility] ADULT ICU REPLACEMENT PROTOCOL   The patient does apply for the [facility] Adult ICU Electrolyte Replacment Protocol based on the criteria listed below:   1. Is GFR >/= 30 ml/min? Yes.    Patient's GFR today is >60 2. Is SCr </= 2? Yes.   Patient's SCr is 0.95 ml/kg/hr 3. Did SCr increase >/= 0.5 in 24 hours? No. 4. Abnormal electrolyte(s): k+ 3.1 5. Ordered repletion with: standing orders 6. If a panic level lab has been reported, has the CCM MD in charge been notified? No..   Physician:    Darlys Gales 02/04/2021 3:01 AM

## 2021-02-04 NOTE — Progress Notes (Signed)
Kimberly Kelly 11:52 AM  Subjective: Patient doing okay with her current diet still with some pain and nausea no new complaints and again we answered all of her and her husband's questions and we discussed cyst drainage again Objective: Vital signs stable afebrile no acute distress upper tenderness unchanged no rebound white count okay hemoglobin slight drop BUN and creatinine and liver tests okay albumin low  Assessment: Complications of pancreatitis  Plan: Await Dr. Donneta Romberg opinion tomorrow and might need some TPN at some point and otherwise continue present management  [facility]  office [phone] After 5PM or if no answer call [phone]

## 2021-02-04 NOTE — Progress Notes (Signed)
[place] for Lovenox Indication:  Portal vein thrombus  No Known Allergies  Patient Measurements: Height: 5\' 7"  (170.2 cm) Weight: 87.9 kg (193 lb 12.6 oz) IBW/kg (Calculated) : 61.6 Heparin Dosing Weight: 78.7 kg  Vital Signs: Temp: 98.2 F (36.8 C) (07/05 0458) BP: 116/74 (07/05 0458) Pulse Rate: 106 (07/05 0458)  Labs: Recent Labs    02/02/21 0723 02/02/21 1610 02/02/21 2303 02/03/21 0755 02/04/21 0042  HGB 9.4*  --   --  10.0* 8.7*  HCT 28.9*  --   --  31.0* 26.9*  PLT 205  --   --  278 255  LABPROT 15.4*  --   --  14.4 14.6  INR 1.2  --   --  1.1 1.1  HEPARINUNFRC <0.10*   < > 0.17* 0.27* 0.21*  CREATININE 1.09*  --   --  1.03* 0.95   < > = values in this interval not displayed.     Estimated Creatinine Clearance: 80.6 mL/min (by C-G formula based on SCr of 0.95 mg/dL).   Assessment: 50 yo female with new nonocclusive thrombus in distal main and proximal left portal veins per CT. Pharmacy originally consulted to dose IV heparin but has not had a therapeutic heparin level since starting heparin drip on 7/2. Spoke with RN and there is concern IV site may not be working properly. No bleeding noted, Hgb down to 8.7. Discussed above with Dr Avon Gully - he agrees to change to Lovenox.  Goal of Therapy:  Anti-Xa level 0.6-1 units/ml 4hrs after LMWH dose given Monitor platelets by anticoagulation protocol: Yes   Plan:  Discontinue heparin drip Lovenox 80 mg SQ q12h (rounded down due to Hgb) Monitor CBC at least q72h while on Lovenox F/U transition to oral agent once procedures complete  Thank you for involving pharmacy in this patient's care.  Renold Genta, PharmD, BCPS Clinical Pharmacist Clinical phone for 02/04/2021 until 3p is [phone] 02/04/2021 8:34 AM  **Pharmacist phone directory can be found on [email or web address] listed under [place]**

## 2021-02-05 LAB — BASIC METABOLIC PANEL
Anion gap: 6 (ref 5–15)
BUN: <5 mg/dL — ABNORMAL LOW (ref 6–20)
CO2: 29 mmol/L (ref 22–32)
Calcium: 8.3 mg/dL — ABNORMAL LOW (ref 8.9–10.3)
Chloride: 104 mmol/L (ref 98–111)
Creatinine, Ser: 0.9 mg/dL (ref 0.44–1.00)
GFR, Estimated: >60 mL/min (ref >60)
Glucose, Bld: 126 mg/dL — ABNORMAL HIGH (ref 70–99)
Potassium: 3.6 mmol/L (ref 3.5–5.1)
Sodium: 139 mmol/L (ref 135–145)

## 2021-02-05 LAB — CBC
HCT: 26.3 % — ABNORMAL LOW (ref 36.0–46.0)
Hemoglobin: 8.5 g/dL — ABNORMAL LOW (ref 12.0–15.0)
MCH: 28.2 pg (ref 26.0–34.0)
MCHC: 32.3 g/dL (ref 30.0–36.0)
MCV: 87.4 fL (ref 80.0–100.0)
Platelets: 270 10*3/uL (ref 150–400)
RBC: 3.01 MIL/uL — ABNORMAL LOW (ref 3.87–5.11)
RDW: 13 % (ref 11.5–15.5)
WBC: 4.5 10*3/uL (ref 4.0–10.5)
nRBC: 0 % (ref 0.0–0.2)

## 2021-02-05 LAB — GLUCOSE, CAPILLARY
Glucose-Capillary: 110 mg/dL — ABNORMAL HIGH (ref 70–99)
Glucose-Capillary: 119 mg/dL — ABNORMAL HIGH (ref 70–99)
Glucose-Capillary: 124 mg/dL — ABNORMAL HIGH (ref 70–99)
Glucose-Capillary: 135 mg/dL — ABNORMAL HIGH (ref 70–99)
Glucose-Capillary: 137 mg/dL — ABNORMAL HIGH (ref 70–99)
Glucose-Capillary: 98 mg/dL (ref 70–99)

## 2021-02-05 LAB — PROTIME-INR
INR: 1.3 — ABNORMAL HIGH (ref 0.8–1.2)
Prothrombin Time: 16 seconds — ABNORMAL HIGH (ref 11.4–15.2)

## 2021-02-05 MED ORDER — ESCITALOPRAM OXALATE 10 MG PO TABS
10.0000 mg | ORAL_TABLET | Freq: Every day | ORAL | Status: DC
  Administered 2021-02-05 – 2021-02-11 (×6): 10 mg via ORAL
  Filled 2021-02-05 (×7): qty 1

## 2021-02-05 NOTE — TOC Benefit Eligibility Note (Signed)
Transition of Care [facility]) Benefit Eligibility Note    Patient Details  Name: GEOFFREY MANKIN MRN: [id number] Date of Birth: 1971-06-13   Medication/Dose: ENOXAPARIN  Covered?: Yes  Tier: 2 Drug (PREFERRED)  Prescription Coverage Preferred Pharmacy: [facility] with Person/Company/Phone Number:: [facility]  @ [employer] #  [phone]  Co-Pay: $10.00  Prior Approval: No  Deductible: Met  Additional Notes: LOVENOX  : COVER- [employer] # [phone] OPT-3 OPT-3    Memory Argue Phone Number: 02/05/2021, 10:31 AM

## 2021-02-05 NOTE — Progress Notes (Signed)
Kimberly Kelly 8:58 AM  Subjective: The patient is doing well walking in the halls and about twice a day she will have her acute pain and nausea in her mid epigastric area and we extensively discussed her procedure including the risks and benefits discussed her blood thinner and I discussed her case yesterday with Dr. Rush Landmark who is willing to proceed on Friday we answered all of her questions she is happy to proceed  Objective: Vital signs stable afebrile no acute distress minimal midepigastric discomfort essentially the same labs stable  Assessment: Symptomatic pseudocyst and oral vein thrombosis  Plan: Dr. Rush Landmark is willing to proceed at 1230 on Friday and we will hold blood thinners after tomorrow's dose with further work-up and plans pending those findings and probably restart 48 hours after the procedure with IV heparin and then when stable transition to oral medication  [place] [facility]  office [phone] After 5PM or if no answer call [phone]

## 2021-02-05 NOTE — Progress Notes (Signed)
Lovenox benefit check requested. Lurline Idol, MSW, LCSW 7/6/20229:21 AM

## 2021-02-05 NOTE — Progress Notes (Signed)
PROGRESS NOTE    Kimberly Kelly  JJH:[id number] DOB: 1970-09-14 DOA: 01/31/2021 PCP: [name], FNP   Brief Narrative:  Kimberly Kelly is a 50 yo woman with a hx of anxiety/depression, hx of lung nodules, recent pancreatitis, here with n/v, generalized abdominal pain, fever. Acute intractable nausea a vomiting 24h prior to admission. Took tylenol and zofran with no improvement. Recently hospitalized from 6/7-6/22 at [facility] with post ERCP pancreatitis.   Diarrhea during hospitalization. ARF during hospitalization - atelectasis, pleural effusions (diuresed), transaminases, AKI, sinus tach.    In ED notable hypotension with leukocytosis, started heparin, cefepime, ceftriaxone, flagyl, phenergan, zofran and ativan - transferred to our facility for GI evaluation and possible intervention given necrotic pancreatitis and large pancreatitc pseudocyst.  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 -Continue meropenem -Blood cultures remain negative -pain control with morphine IV & oxycodone PRN. Recommend zofran with oxycodone -GI following, plan for 1230 procedure on Friday, 02/07/2021 -hold anticoagulation end of day 02/06/2021, resume 48 hours postoperatively -transition to p.o. once stabilizing   Coffee ground emesis in the setting of GERD without history of GI bleed Acute blood loss anemia, stabilizing On EGD: moderately severe esophagitis, one superficial esophageal ulcer. Diffuse mild inflammation due to vascular congestion in stomach. Extrinsic compression of stomach by psudeocyst. -Continue heparin as below, follow closely for repeat  episodes of bleeding -PPI twice daily, advance diet as tolerated in setting of above  Hyperglycemia, concern for endocrine pancreatic dysfunction -SSI PRN -goal BG 140-180 if requiring insulin -High risk for pancreatic destruction and subsequent diabetes, will need close outpatient follow-up; consider glucose monitor at discharge   Mild AKI, improving -In the setting of above, advance diet as tolerated, IV fluids ongoing    UTI: asymptomatic. -Covered by meropenem as above  Portal Vein thrombosis -Transition from heparin drip to Lovenox given IV issues and labile heparin levels -likely transition to DOAC at discharge   Incidentally noted pulmonary nodules  -OP follow up as previously scheduled   Anxiety/depression -con't PTA lexapro  DVT prophylaxis: Lovenox, full dose Code Status: Full Family Communication: None present today  Status is: Inpatient  Dispo: The patient is from: Home              Anticipated d/c is to: Home              Anticipated d/c date is: 48-72 hours              Patient currently not medically stable for discharge  Consultants:  GI, PCCM  Procedures:  Tentative endoscopy 02/07/2021  Antimicrobials:  Meropenem  Subjective: No acute issues or events overnight, tolerating p.o. well denies nausea vomiting diarrhea constipation headache fevers or chills.  Objective: Vitals:   02/04/21 1649 02/04/21 2048 02/05/21 0448 02/05/21 0528  BP: 102/69 129/82  114/83  Pulse: (!) 113 (!) 104  80  Resp: 18 18  18   Temp: 99.4 F (37.4 C) 98.4 F (36.9 C)  97.6 F (36.4 C)  TempSrc: Oral   Oral  SpO2: 97% 95%  100%  Weight:   87.2 kg   Height:        Intake/Output Summary (Last 24 hours) at 02/05/2021 [phone]  Last data filed at 02/05/2021 0448 Gross per 24 hour  Intake 420 ml  Output --  Net 420 ml    Filed Weights   02/03/21 0500 02/04/21 0500 02/05/21 0448  Weight: 86.7 kg 87.9 kg 87.2 kg    Examination:  General:  Pleasantly resting in bed, No  acute distress. HEENT:  Normocephalic atraumatic.  Sclerae nonicteric, noninjected.  Extraocular movements intact bilaterally. Neck:  Without mass or deformity.  Trachea is midline. Lungs:  Clear to auscultate bilaterally without rhonchi, wheeze, or rales. Heart:  Regular rate and rhythm.  Without murmurs, rubs, or gallops. Abdomen:  Soft, minimally tender diffusely PMI epigastrium, nondistended.  Without guarding or rebound. Extremities: Without cyanosis, clubbing, edema, or obvious deformity. Vascular:  Dorsalis pedis and posterior tibial pulses palpable bilaterally. Skin:  Warm and dry, no erythema, no ulcerations.  Data Reviewed: I have personally reviewed following labs and imaging studies  CBC: Recent Labs  Lab 01/31/21 1539 02/01/21 0201 02/01/21 0452 02/02/21 0723 02/03/21 0755 02/04/21 0042 02/05/21 0055  WBC 16.5* 12.4* 12.0* 8.8 6.9 5.3 4.5  NEUTROABS 14.7* 10.9*  --   --   --   --   --   HGB 12.0 9.8* 10.1* 9.4* 10.0* 8.7* 8.5*  HCT 36.3 30.4* 30.5* 28.9* 31.0* 26.9* 26.3*  MCV 86.2 87.4 86.9 87.6 88.3 87.9 87.4  PLT 277 227 220 205 278 255 892    Basic Metabolic Panel: Recent Labs  Lab 02/01/21 0452 02/02/21 0723 02/03/21 0755 02/04/21 0042 02/05/21 0055  NA 141 139 139 137 139  K 3.7 3.9 3.6 3.1* 3.6  CL 104 103 103 101 104  CO2 24 24 26 29 29   GLUCOSE 174* 128* 114* 121* 126*  BUN 10 8 6  <5* <5*  CREATININE 1.19* 1.09* 1.03* 0.95 0.90  CALCIUM 8.4* 8.2* 8.3* 8.0* 8.3*  MG 1.7 2.1  --   --   --     GFR: Estimated Creatinine Clearance: 84.8 mL/min (by C-G formula based on SCr of 0.9 mg/dL). Liver Function Tests: Recent Labs  Lab 01/31/21 1539 02/04/21 0042  AST 36 12*  ALT 61* 18  ALKPHOS 250* 115  BILITOT 0.7 0.7  PROT 7.4 5.2*  ALBUMIN 3.6 2.1*    Recent Labs  Lab 01/31/21 1539 02/01/21 0201  LIPASE 83* 74*    No results for input(s): AMMONIA in the last 168 hours. Coagulation Profile: Recent Labs  Lab 02/01/21 0452  02/02/21 0723 02/03/21 0755 02/04/21 0042 02/05/21 0055  INR 1.2 1.2 1.1 1.1 1.3*    Cardiac Enzymes: No results for input(s): CKTOTAL, CKMB, CKMBINDEX, TROPONINI in the last 168 hours. BNP (last 3 results) No results for input(s): PROBNP in the last 8760 hours. HbA1C: No results for input(s): HGBA1C in the last 72 hours. CBG: Recent Labs  Lab 02/04/21 1423 02/04/21 1645 02/04/21 2002 02/05/21 0058 02/05/21 0314  GLUCAP 97 107* 148* 124* 110*    Lipid Profile: No results for input(s): CHOL, HDL, LDLCALC, TRIG, CHOLHDL, LDLDIRECT in the last 72 hours. Thyroid Function Tests: No results for input(s): TSH, T4TOTAL, FREET4, T3FREE, THYROIDAB in the last 72 hours. Anemia Panel: No results for input(s): VITAMINB12, FOLATE, FERRITIN, TIBC, IRON, RETICCTPCT in the last 72 hours. Sepsis Labs: Recent Labs  Lab 02/01/21 0201 02/01/21 0202 02/01/21 0452  PROCALCITON 17.13  --   --   LATICACIDVEN  --  0.8 0.9     Recent Results (from the past 240 hour(s))  Resp Panel by RT-PCR (Flu A&B, Covid)  Nasopharyngeal Swab     Status: None   Collection Time: 01/31/21  7:47 PM   Specimen: Nasopharyngeal Swab; Nasopharyngeal(NP) swabs in vial transport medium  Result Value Ref Range Status   SARS Coronavirus 2 by RT PCR NEGATIVE NEGATIVE Final    Comment: (NOTE) SARS-CoV-2 target nucleic acids are NOT DETECTED.  The SARS-CoV-2 RNA is generally detectable in upper respiratory specimens during the acute phase of infection. The lowest concentration of SARS-CoV-2 viral copies this assay can detect is 138 copies/mL. A negative result does not preclude SARS-Cov-2 infection and should not be used as the sole basis for treatment or other patient management decisions. A negative result may occur with  improper specimen collection/handling, submission of specimen other than nasopharyngeal swab, presence of viral mutation(s) within the areas targeted by this assay, and inadequate number of  viral copies(<138 copies/mL). A negative result must be combined with clinical observations, patient history, and epidemiological information. The expected result is Negative.  Fact Sheet for Patients:  EntrepreneurPulse.com.au  Fact Sheet for Healthcare Providers:  IncredibleEmployment.be  This test is no t yet approved or cleared by the [place] FDA and  has been authorized for detection and/or diagnosis of SARS-CoV-2 by FDA under an Emergency Use Authorization (EUA). This EUA will remain  in effect (meaning this test can be used) for the duration of the COVID-19 declaration under Section 564(b)(1) of the Act, 21 U.S.C.section 360bbb-3(b)(1), unless the authorization is terminated  or revoked sooner.       Influenza A by PCR NEGATIVE NEGATIVE Final   Influenza B by PCR NEGATIVE NEGATIVE Final    Comment: (NOTE) The Xpert Xpress SARS-CoV-2/FLU/RSV plus assay is intended as an aid in the diagnosis of influenza from Nasopharyngeal swab specimens and should not be used as a sole basis for treatment. Nasal washings and aspirates are unacceptable for Xpert Xpress SARS-CoV-2/FLU/RSV testing.  Fact Sheet for Patients: EntrepreneurPulse.com.au  Fact Sheet for Healthcare Providers: IncredibleEmployment.be  This test is not yet approved or cleared by the [place] FDA and has been authorized for detection and/or diagnosis of SARS-CoV-2 by FDA under an Emergency Use Authorization (EUA). This EUA will remain in effect (meaning this test can be used) for the duration of the COVID-19 declaration under Section 564(b)(1) of the Act, 21 U.S.C. section 360bbb-3(b)(1), unless the authorization is terminated or revoked.  Performed at [facility], [address] [place], [place] [address]   MRSA Next Gen by PCR, Nasal     Status: None   Collection Time: 02/01/21 12:26 AM   Specimen: Nasal  Mucosa; Nasal Swab  Result Value Ref Range Status   MRSA by PCR Next Gen NOT DETECTED NOT DETECTED Final    Comment: (NOTE) The GeneXpert MRSA Assay (FDA approved for NASAL specimens only), is one component of a comprehensive MRSA colonization surveillance program. It is not intended to diagnose MRSA infection nor to guide or monitor treatment for MRSA infections. Test performance is not FDA approved in patients less than 63 years old. Performed at [facility], [place] [address] [place], [place] [address]   Culture, blood (routine x 2)     Status: None (Preliminary result)   Collection Time: 02/01/21  2:10 AM   Specimen: BLOOD  Result Value Ref Range Status   Specimen Description BLOOD LEFT ANTECUBITAL  Final   Special Requests   Final    BOTTLES DRAWN AEROBIC AND ANAEROBIC Blood Culture results may not be optimal due to an inadequate volume  of blood received in culture bottles   Culture   Final    NO GROWTH 3 DAYS Performed at [facility], [place] [employer] [address] [place], [place] [address]    Report Status PENDING  Incomplete  Culture, blood (routine x 2)     Status: None (Preliminary result)   Collection Time: 02/01/21  2:21 AM   Specimen: BLOOD LEFT HAND  Result Value Ref Range Status   Specimen Description BLOOD LEFT HAND  Final   Special Requests   Final    BOTTLES DRAWN AEROBIC AND ANAEROBIC Blood Culture adequate volume   Culture   Final    NO GROWTH 3 DAYS Performed at [facility], [place] [address] [place], [place] [address]    Report Status PENDING  Incomplete     Radiology Studies: No results found.  Scheduled Meds:  Chlorhexidine Gluconate Cloth  6 each Topical Daily   enoxaparin (LOVENOX) injection  80 mg Subcutaneous Q12H   insulin aspart  1-3 Units Subcutaneous Q4H   pantoprazole (PROTONIX) IV  40 mg Intravenous Q12H   Continuous Infusions:  lactated ringers 50 mL/hr at 02/04/21 1421   meropenem (MERREM) IV 1 g (02/05/21 0448)   promethazine  (PHENERGAN) injection (IM or IVPB)      LOS: 4 days   Time spent: 79min  Hiromi Knodel C Nala Kachel, DO [facility]  If 7PM-7AM, please contact night-coverage [email or web address]  02/05/2021, 7:42 AM

## 2021-02-06 LAB — CULTURE, BLOOD (ROUTINE X 2)
Culture: NO GROWTH
Culture: NO GROWTH
Special Requests: ADEQUATE

## 2021-02-06 LAB — GLUCOSE, CAPILLARY
Glucose-Capillary: 111 mg/dL — ABNORMAL HIGH (ref 70–99)
Glucose-Capillary: 149 mg/dL — ABNORMAL HIGH (ref 70–99)
Glucose-Capillary: 116 mg/dL — ABNORMAL HIGH (ref 70–99)
Glucose-Capillary: 137 mg/dL — ABNORMAL HIGH (ref 70–99)
Glucose-Capillary: 152 mg/dL — ABNORMAL HIGH (ref 70–99)

## 2021-02-06 LAB — BASIC METABOLIC PANEL
Anion gap: 6 (ref 5–15)
BUN: <5 mg/dL — ABNORMAL LOW (ref 6–20)
CO2: 30 mmol/L (ref 22–32)
Calcium: 8.3 mg/dL — ABNORMAL LOW (ref 8.9–10.3)
Chloride: 103 mmol/L (ref 98–111)
Creatinine, Ser: 0.92 mg/dL (ref 0.44–1.00)
GFR, Estimated: >60 mL/min (ref >60)
Glucose, Bld: 127 mg/dL — ABNORMAL HIGH (ref 70–99)
Potassium: 3.7 mmol/L (ref 3.5–5.1)
Sodium: 139 mmol/L (ref 135–145)

## 2021-02-06 LAB — CBC
HCT: 27 % — ABNORMAL LOW (ref 36.0–46.0)
Hemoglobin: 8.6 g/dL — ABNORMAL LOW (ref 12.0–15.0)
MCH: 28 pg (ref 26.0–34.0)
MCHC: 31.9 g/dL (ref 30.0–36.0)
MCV: 87.9 fL (ref 80.0–100.0)
Platelets: 322 10*3/uL (ref 150–400)
RBC: 3.07 MIL/uL — ABNORMAL LOW (ref 3.87–5.11)
RDW: 13.1 % (ref 11.5–15.5)
WBC: 10 10*3/uL (ref 4.0–10.5)
nRBC: 0 % (ref 0.0–0.2)

## 2021-02-06 LAB — PROTIME-INR
INR: 1.3 — ABNORMAL HIGH (ref 0.8–1.2)
Prothrombin Time: 15.9 seconds — ABNORMAL HIGH (ref 11.4–15.2)

## 2021-02-06 MED ORDER — ENSURE ENLIVE PO LIQD
237.0000 mL | Freq: Two times a day (BID) | ORAL | Status: DC
  Administered 2021-02-06 – 2021-02-10 (×4): 237 mL via ORAL
  Filled 2021-02-06 (×3): qty 237

## 2021-02-06 MED ORDER — ENOXAPARIN SODIUM 80 MG/0.8ML IJ SOSY: 80.0000 mg | PREFILLED_SYRINGE | Freq: Two times a day (BID) | INTRAMUSCULAR | Status: DC

## 2021-02-06 NOTE — Progress Notes (Addendum)
[place] for Lovenox Indication:  Portal vein thrombus  No Known Allergies  Patient Measurements: Height: 5\' 7"  (170.2 cm) Weight: 88.6 kg (195 lb 5.2 oz) IBW/kg (Calculated) : 61.6 Heparin Dosing Weight: 78.7 kg  Vital Signs: Temp: 97.8 F (36.6 C) (07/07 0334) Temp Source: Oral (07/07 0334) BP: 116/75 (07/07 0334) Pulse Rate: 117 (07/07 0334)  Labs: Recent Labs    02/04/21 0042 02/05/21 0055 02/06/21 0325  HGB 8.7* 8.5* 8.6*  HCT 26.9* 26.3* 27.0*  PLT 255 270 322  LABPROT 14.6 16.0* 15.9*  INR 1.1 1.3* 1.3*  HEPARINUNFRC 0.21*  --   --   CREATININE 0.95 0.90 0.92     Estimated Creatinine Clearance: 83.6 mL/min (by C-G formula based on SCr of 0.92 mg/dL).   Assessment: 50 yo female with new nonocclusive thrombus in distal main and proximal left portal veins per CT. Pharmacy originally consulted to dose IV heparin but has not had a therapeutic heparin level since starting heparin drip on 7/2. Spoke with RN and there is concern IV site may not be working properly. No bleeding noted, Hgb down to 8.7. Discussed above with Dr Avon Gully - he agrees to change to Lovenox.  Ok to add stop date to lovenox after today's AM dose with anticipation of procedure tomorrow per GI. Dr. Avon Gully is aware.  Goal of Therapy:  Anti-Xa level 0.6-1 units/ml 4hrs after LMWH dose given Monitor platelets by anticoagulation protocol: Yes   Plan:  Dc Lovenox F/U transition to oral agent once procedures complete  Thank you for involving pharmacy in this patient's care.  Onnie Boer, PharmD, BCIDP, AAHIVP, CPP Infectious Disease Pharmacist 02/06/2021 8:58 AM

## 2021-02-06 NOTE — Progress Notes (Signed)
Kimberly Kelly 10:08 AM  Subjective: Patient with a little more nausea but no new complaints and again her procedure tomorrow was discussed with her and her husband and we answered all of their questions including some of the possible long-term issues cyst recurrence etc. and again case discussed with Dr. Rush Landmark as well  Objective: Vital signs stable afebrile no acute distress patient not examined today looks well labs stable  Assessment: Symptomatic pseudocyst  Plan: Await findings at procedure tomorrow and will ask Dr. Alessandra Bevels to round on this weekend and will stop her Lovenox after the morning dose  [facility]  office [phone] After 5PM or if no answer call [phone]

## 2021-02-06 NOTE — Progress Notes (Signed)
PROGRESS NOTE    Kimberly Kelly  PRF:[id number] DOB: 1970-09-11 DOA: 01/31/2021 PCP: [name], FNP   Brief Narrative:  [name] is a 50 yo woman with a hx of anxiety/depression, hx of lung nodules, recent pancreatitis, here with n/v, generalized abdominal pain, fever. Acute intractable nausea a vomiting 24h prior to admission. Took tylenol and zofran with no improvement. Recently hospitalized from 6/7-6/22 at [facility] with post ERCP pancreatitis.   Diarrhea during hospitalization. ARF during hospitalization - atelectasis, pleural effusions (diuresed), transaminases, AKI, sinus tach.    In ED notable hypotension with leukocytosis, started heparin, cefepime, ceftriaxone, flagyl, phenergan, zofran and ativan - transferred to our facility for GI evaluation and possible intervention given necrotic pancreatitis and large pancreatitc pseudocyst.  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 -Continue meropenem -Blood cultures remain negative -pain control with morphine IV & oxycodone PRN. Recommend zofran with oxycodone -GI following, plan for 1230 procedure on Friday, 02/07/2021 -hold anticoagulation end of day 02/06/2021, resume 48 hours postoperatively -transition to p.o. once stabilizing   Coffee ground emesis in the setting of GERD without history of GI bleed Acute blood loss anemia, stabilizing On EGD: moderately severe esophagitis, one superficial esophageal ulcer. Diffuse mild inflammation due to vascular congestion in stomach. Extrinsic compression of stomach by psudeocyst. -Continue heparin as below, follow closely for repeat  episodes of bleeding -PPI twice daily, advance diet as tolerated in setting of above  Hyperglycemia, concern for endocrine pancreatic dysfunction -SSI PRN -goal BG 140-180 if requiring insulin -High risk for pancreatic destruction and subsequent diabetes, will need close outpatient follow-up; consider glucose monitor at discharge   Mild AKI, improving -In the setting of above, advance diet as tolerated, IV fluids ongoing    UTI: asymptomatic. -Covered by meropenem as above  Portal Vein thrombosis -Transition from heparin drip to Lovenox given IV issues and labile heparin levels -likely transition to DOAC at discharge   Incidentally noted pulmonary nodules  -OP follow up as previously scheduled   Anxiety/depression -con't PTA lexapro  DVT prophylaxis: Lovenox, full dose Code Status: Full Family Communication: None present today  Status is: Inpatient  Dispo: The patient is from: Home              Anticipated d/c is to: Home              Anticipated d/c date is: 48-72 hours              Patient currently not medically stable for discharge  Consultants:  GI, PCCM  Procedures:  Tentative endoscopy 02/07/2021  Antimicrobials:  Meropenem  Subjective: No acute issues or events overnight, some worsening nausea today but denies vomiting diarrhea constipation headache fevers or chills.  Discussed increasing use of Zofran and Phenergan for backing off on diet if necessary.  Somewhat anxious about procedure tomorrow.  Objective: Vitals:   02/05/21 1606 02/05/21 2116 02/06/21 0334 02/06/21 0500  BP: 123/87 129/89 116/75   Pulse: (!) 101 (!) 122 (!) 117   Resp: 16 16 18    Temp: 98.7 F (37.1 C) 98.1 F (36.7 C) 97.8 F (36.6 C)   TempSrc:  Oral Oral   SpO2: 98% 95% 92%  Weight:    88.6 kg  Height:        Intake/Output Summary (Last 24 hours) at 02/06/2021 0845 Last data filed at 02/05/2021 1748 Gross per 24 hour  Intake 769.59 ml  Output --  Net 769.59 ml    Filed  Weights   02/04/21 0500 02/05/21 0448 02/06/21 0500  Weight: 87.9 kg 87.2 kg 88.6 kg    Examination:  General:  Pleasantly resting in bed, No acute distress. HEENT:  Normocephalic atraumatic.  Sclerae nonicteric, noninjected.  Extraocular movements intact bilaterally. Neck:  Without mass or deformity.  Trachea is midline. Lungs:  Clear to auscultate bilaterally without rhonchi, wheeze, or rales. Heart:  Regular rate and rhythm.  Without murmurs, rubs, or gallops. Abdomen:  Soft, minimally tender diffusely PMI epigastrium, nondistended.  Without guarding or rebound. Extremities: Without cyanosis, clubbing, edema, or obvious deformity. Vascular:  Dorsalis pedis and posterior tibial pulses palpable bilaterally. Skin:  Warm and dry, no erythema, no ulcerations.  Data Reviewed: I have personally reviewed following labs and imaging studies  CBC: Recent Labs  Lab 01/31/21 1539 02/01/21 0201 02/01/21 0452 02/02/21 0723 02/03/21 0755 02/04/21 0042 02/05/21 0055 02/06/21 0325  WBC 16.5* 12.4*   < > 8.8 6.9 5.3 4.5 10.0  NEUTROABS 14.7* 10.9*  --   --   --   --   --   --   HGB 12.0 9.8*   < > 9.4* 10.0* 8.7* 8.5* 8.6*  HCT 36.3 30.4*   < > 28.9* 31.0* 26.9* 26.3* 27.0*  MCV 86.2 87.4   < > 87.6 88.3 87.9 87.4 87.9  PLT 277 227   < > 205 278 255 270 322   < > = values in this interval not displayed.    Basic Metabolic Panel: Recent Labs  Lab 02/01/21 0452 02/02/21 0723 02/03/21 0755 02/04/21 0042 02/05/21 0055 02/06/21 0325  NA 141 139 139 137 139 139  K 3.7 3.9 3.6 3.1* 3.6 3.7  CL 104 103 103 101 104 103  CO2 24 24 26 29 29 30   GLUCOSE 174* 128* 114* 121* 126* 127*  BUN 10 8 6  <5* <5* <5*  CREATININE 1.19* 1.09* 1.03* 0.95 0.90 0.92  CALCIUM 8.4* 8.2* 8.3* 8.0* 8.3* 8.3*  MG 1.7 2.1  --   --   --   --     GFR: Estimated Creatinine Clearance: 83.6 mL/min (by C-G formula based on SCr of 0.92 mg/dL). Liver Function Tests: Recent Labs  Lab 01/31/21 1539  02/04/21 0042  AST 36 12*  ALT 61* 18  ALKPHOS 250* 115  BILITOT 0.7 0.7  PROT 7.4 5.2*  ALBUMIN 3.6 2.1*    Recent Labs  Lab 01/31/21 1539 02/01/21 0201  LIPASE 83* 74*    No results for input(s): AMMONIA in the last 168 hours. Coagulation Profile: Recent Labs  Lab 02/02/21 0723 02/03/21 0755 02/04/21 0042 02/05/21 0055 02/06/21 0325  INR 1.2 1.1 1.1 1.3* 1.3*    Cardiac Enzymes: No results for input(s): CKTOTAL, CKMB, CKMBINDEX, TROPONINI in the last 168 hours. BNP (last 3 results) No results for input(s): PROBNP in the last 8760 hours. HbA1C: No results for input(s): HGBA1C in the last 72 hours. CBG: Recent Labs  Lab 02/05/21 1144 02/05/21 1555 02/05/21 2329 02/06/21 0333 02/06/21 0746  GLUCAP 119* 98 137* 111* 149*    Lipid Profile: No results for input(s): CHOL, HDL, LDLCALC, TRIG, CHOLHDL, LDLDIRECT in the last 72 hours. Thyroid Function Tests: No results for input(s): TSH, T4TOTAL,  FREET4, T3FREE, THYROIDAB in the last 72 hours. Anemia Panel: No results for input(s): VITAMINB12, FOLATE, FERRITIN, TIBC, IRON, RETICCTPCT in the last 72 hours. Sepsis Labs: Recent Labs  Lab 02/01/21 0201 02/01/21 0202 02/01/21 0452  PROCALCITON 17.13  --   --   LATICACIDVEN  --  0.8 0.9     Recent Results (from the past 240 hour(s))  Resp Panel by RT-PCR (Flu A&B, Covid) Nasopharyngeal Swab     Status: None   Collection Time: 01/31/21  7:47 PM   Specimen: Nasopharyngeal Swab; Nasopharyngeal(NP) swabs in vial transport medium  Result Value Ref Range Status   SARS Coronavirus 2 by RT PCR NEGATIVE NEGATIVE Final    Comment: (NOTE) SARS-CoV-2 target nucleic acids are NOT DETECTED.  The SARS-CoV-2 RNA is generally detectable in upper respiratory specimens during the acute phase of infection. The lowest concentration of SARS-CoV-2 viral copies this assay can detect is 138 copies/mL. A negative result does not preclude SARS-Cov-2 infection and should not be used  as the sole basis for treatment or other patient management decisions. A negative result may occur with  improper specimen collection/handling, submission of specimen other than nasopharyngeal swab, presence of viral mutation(s) within the areas targeted by this assay, and inadequate number of viral copies(<138 copies/mL). A negative result must be combined with clinical observations, patient history, and epidemiological information. The expected result is Negative.  Fact Sheet for Patients:  EntrepreneurPulse.com.au  Fact Sheet for Healthcare Providers:  IncredibleEmployment.be  This test is no t yet approved or cleared by the [place] FDA and  has been authorized for detection and/or diagnosis of SARS-CoV-2 by FDA under an Emergency Use Authorization (EUA). This EUA will remain  in effect (meaning this test can be used) for the duration of the COVID-19 declaration under Section 564(b)(1) of the Act, 21 U.S.C.section 360bbb-3(b)(1), unless the authorization is terminated  or revoked sooner.       Influenza A by PCR NEGATIVE NEGATIVE Final   Influenza B by PCR NEGATIVE NEGATIVE Final    Comment: (NOTE) The Xpert Xpress SARS-CoV-2/FLU/RSV plus assay is intended as an aid in the diagnosis of influenza from Nasopharyngeal swab specimens and should not be used as a sole basis for treatment. Nasal washings and aspirates are unacceptable for Xpert Xpress SARS-CoV-2/FLU/RSV testing.  Fact Sheet for Patients: EntrepreneurPulse.com.au  Fact Sheet for Healthcare Providers: IncredibleEmployment.be  This test is not yet approved or cleared by the [place] FDA and has been authorized for detection and/or diagnosis of SARS-CoV-2 by FDA under an Emergency Use Authorization (EUA). This EUA will remain in effect (meaning this test can be used) for the duration of the COVID-19 declaration under Section 564(b)(1)  of the Act, 21 U.S.C. section 360bbb-3(b)(1), unless the authorization is terminated or revoked.  Performed at [facility], [address], [place], [place] [address]   MRSA Next Gen by PCR, Nasal     Status: None   Collection Time: 02/01/21 12:26 AM   Specimen: Nasal Mucosa; Nasal Swab  Result Value Ref Range Status   MRSA by PCR Next Gen NOT DETECTED NOT DETECTED Final    Comment: (NOTE) The GeneXpert MRSA Assay (FDA approved for NASAL specimens only), is one component of a comprehensive MRSA colonization surveillance program. It is not intended to diagnose MRSA infection nor to guide or monitor treatment for MRSA infections. Test performance is not FDA approved in patients less than 84 years old. Performed at [facility], [place] [address]  [place], [place] [address]   Culture, blood (routine x 2)     Status: None   Collection Time: 02/01/21  2:10 AM   Specimen: BLOOD  Result Value Ref Range Status   Specimen Description BLOOD LEFT ANTECUBITAL  Final   Special Requests   Final    BOTTLES DRAWN AEROBIC AND ANAEROBIC Blood Culture results may not be optimal due to an inadequate volume of blood received in culture bottles   Culture   Final    NO GROWTH 5 DAYS Performed at [facility], [place] [address] [place], [place] [address]    Report Status 02/06/2021 FINAL  Final  Culture, blood (routine x 2)     Status: None   Collection Time: 02/01/21  2:21 AM   Specimen: BLOOD LEFT HAND  Result Value Ref Range Status   Specimen Description BLOOD LEFT HAND  Final   Special Requests   Final    BOTTLES DRAWN AEROBIC AND ANAEROBIC Blood Culture adequate volume   Culture   Final    NO GROWTH 5 DAYS Performed at [facility], [place] [address], [place], [place] [address]    Report Status 02/06/2021 FINAL  Final     Radiology Studies: No results found.  Scheduled Meds:  Chlorhexidine Gluconate Cloth  6 each Topical Daily   enoxaparin (LOVENOX)  injection  80 mg Subcutaneous Q12H   escitalopram  10 mg Oral Daily   insulin aspart  1-3 Units Subcutaneous Q4H   pantoprazole (PROTONIX) IV  40 mg Intravenous Q12H   Continuous Infusions:  lactated ringers 50 mL/hr at 02/04/21 1421   meropenem (MERREM) IV 1 g (02/06/21 0015)   promethazine (PHENERGAN) injection (IM or IVPB)      LOS: 5 days   Time spent: 51min  Tarnesha Ulloa C Irish Breisch, DO [facility]  If 7PM-7AM, please contact night-coverage [email or web address]  02/06/2021, 8:45 AM

## 2021-02-06 NOTE — H&P (View-Only) (Signed)
Kimberly Kelly 10:08 AM  Subjective: Patient with a little more nausea but no new complaints and again her procedure tomorrow was discussed with her and her husband and we answered all of their questions including some of the possible long-term issues cyst recurrence etc. and again case discussed with Dr. Rush Landmark as well  Objective: Vital signs stable afebrile no acute distress patient not examined today looks well labs stable  Assessment: Symptomatic pseudocyst  Plan: Await findings at procedure tomorrow and will ask Dr. Alessandra Bevels to round on this weekend and will stop her Lovenox after the morning dose  [facility]  office [phone] After 5PM or if no answer call [phone]

## 2021-02-07 ENCOUNTER — Encounter (HOSPITAL_COMMUNITY): Payer: Self-pay | Admitting: Pulmonary Disease

## 2021-02-07 ENCOUNTER — Ambulatory Visit: Payer: BC Managed Care – PPO | Admitting: Pulmonary Disease

## 2021-02-07 ENCOUNTER — Inpatient Hospital Stay (HOSPITAL_COMMUNITY): Payer: BC Managed Care – PPO | Admitting: Anesthesiology

## 2021-02-07 ENCOUNTER — Inpatient Hospital Stay (HOSPITAL_COMMUNITY): Payer: BC Managed Care – PPO

## 2021-02-07 ENCOUNTER — Encounter (HOSPITAL_COMMUNITY)
Admission: EM | Disposition: A | Discharge status: Other Acute Inpatient Hospital | Payer: Self-pay | Source: Home / Self Care | Attending: Internal Medicine

## 2021-02-07 DIAGNOSIS — K3189 Other diseases of stomach and duodenum: Secondary | ICD-10-CM

## 2021-02-07 DIAGNOSIS — R590 Localized enlarged lymph nodes: Secondary | ICD-10-CM

## 2021-02-07 DIAGNOSIS — K862 Cyst of pancreas: Secondary | ICD-10-CM

## 2021-02-07 HISTORY — PX: BIOPSY: SHX5522

## 2021-02-07 HISTORY — PX: UPPER ESOPHAGEAL ENDOSCOPIC ULTRASOUND (EUS): SHX6562

## 2021-02-07 HISTORY — PX: ESOPHAGOGASTRODUODENOSCOPY (EGD) WITH PROPOFOL: SHX5813

## 2021-02-07 LAB — CBC
HCT: 26 % — ABNORMAL LOW (ref 36.0–46.0)
Hemoglobin: 8.4 g/dL — ABNORMAL LOW (ref 12.0–15.0)
MCH: 28.4 pg (ref 26.0–34.0)
MCHC: 32.3 g/dL (ref 30.0–36.0)
MCV: 87.8 fL (ref 80.0–100.0)
Platelets: 322 10*3/uL (ref 150–400)
RBC: 2.96 MIL/uL — ABNORMAL LOW (ref 3.87–5.11)
RDW: 13.1 % (ref 11.5–15.5)
WBC: 7 10*3/uL (ref 4.0–10.5)
nRBC: 0 % (ref 0.0–0.2)

## 2021-02-07 LAB — BASIC METABOLIC PANEL
Anion gap: 3 — ABNORMAL LOW (ref 5–15)
BUN: <5 mg/dL — ABNORMAL LOW (ref 6–20)
CO2: 35 mmol/L — ABNORMAL HIGH (ref 22–32)
Calcium: 8.2 mg/dL — ABNORMAL LOW (ref 8.9–10.3)
Chloride: 101 mmol/L (ref 98–111)
Creatinine, Ser: 0.88 mg/dL (ref 0.44–1.00)
GFR, Estimated: >60 mL/min (ref >60)
Glucose, Bld: 131 mg/dL — ABNORMAL HIGH (ref 70–99)
Potassium: 3.4 mmol/L — ABNORMAL LOW (ref 3.5–5.1)
Sodium: 139 mmol/L (ref 135–145)

## 2021-02-07 LAB — GLUCOSE, CAPILLARY
Glucose-Capillary: 126 mg/dL — ABNORMAL HIGH (ref 70–99)
Glucose-Capillary: 126 mg/dL — ABNORMAL HIGH (ref 70–99)
Glucose-Capillary: 139 mg/dL — ABNORMAL HIGH (ref 70–99)
Glucose-Capillary: 145 mg/dL — ABNORMAL HIGH (ref 70–99)
Glucose-Capillary: 155 mg/dL — ABNORMAL HIGH (ref 70–99)
Glucose-Capillary: 156 mg/dL — ABNORMAL HIGH (ref 70–99)

## 2021-02-07 LAB — PROTIME-INR
INR: 1.2 (ref 0.8–1.2)
Prothrombin Time: 15.6 seconds — ABNORMAL HIGH (ref 11.4–15.2)

## 2021-02-07 SURGERY — ESOPHAGOGASTRODUODENOSCOPY (EGD) WITH PROPOFOL
Anesthesia: Monitor Anesthesia Care

## 2021-02-07 MED ORDER — PROPOFOL 500 MG/50ML IV EMUL
INTRAVENOUS | Status: DC | PRN
  Administered 2021-02-07: 20 ug/kg/min via INTRAVENOUS

## 2021-02-07 MED ORDER — MIDAZOLAM HCL 5 MG/5ML IJ SOLN
INTRAMUSCULAR | Status: DC | PRN
  Administered 2021-02-07: 2 mg via INTRAVENOUS

## 2021-02-07 MED ORDER — LIDOCAINE HCL (CARDIAC) PF 100 MG/5ML IV SOSY
PREFILLED_SYRINGE | INTRAVENOUS | Status: DC | PRN
  Administered 2021-02-07: 80 mg via INTRATRACHEAL

## 2021-02-07 MED ORDER — MORPHINE SULFATE (PF) 2 MG/ML IV SOLN
INTRAVENOUS | Status: AC
  Filled 2021-02-07: qty 1

## 2021-02-07 MED ORDER — DEXAMETHASONE SODIUM PHOSPHATE 10 MG/ML IJ SOLN
INTRAMUSCULAR | Status: DC | PRN
  Administered 2021-02-07: 10 mg via INTRAVENOUS

## 2021-02-07 MED ORDER — SUGAMMADEX SODIUM 200 MG/2ML IV SOLN
INTRAVENOUS | Status: DC | PRN
  Administered 2021-02-07: 200 mg via INTRAVENOUS

## 2021-02-07 MED ORDER — LACTATED RINGERS IV SOLN: INTRAVENOUS | Status: DC | PRN

## 2021-02-07 MED ORDER — ONDANSETRON HCL 4 MG/2ML IJ SOLN
INTRAMUSCULAR | Status: DC | PRN
  Administered 2021-02-07 (×2): 4 mg via INTRAVENOUS

## 2021-02-07 MED ORDER — ROCURONIUM BROMIDE 100 MG/10ML IV SOLN
INTRAVENOUS | Status: DC | PRN
  Administered 2021-02-07: 50 mg via INTRAVENOUS

## 2021-02-07 MED ORDER — IOHEXOL 350 MG/ML SOLN
100.0000 mL | Freq: Once | INTRAVENOUS | Status: AC | PRN
  Administered 2021-02-07: 100 mL via INTRAVENOUS

## 2021-02-07 MED ORDER — PROPOFOL 10 MG/ML IV BOLUS
INTRAVENOUS | Status: DC | PRN
  Administered 2021-02-07: 160 mg via INTRAVENOUS

## 2021-02-07 MED ORDER — SODIUM CHLORIDE 0.9 % IV SOLN: INTRAVENOUS | Status: DC

## 2021-02-07 SURGICAL SUPPLY — 14 items

## 2021-02-07 NOTE — Anesthesia Postprocedure Evaluation (Signed)
Anesthesia Post Note  Patient: Kimberly Kelly  Procedure(s) Performed: ESOPHAGOGASTRODUODENOSCOPY (EGD) WITH PROPOFOL UPPER ESOPHAGEAL ENDOSCOPIC ULTRASOUND (EUS) (Left) BIOPSY     Patient location during evaluation: Endoscopy Anesthesia Type: General Level of consciousness: awake and alert Pain management: pain level controlled Vital Signs Assessment: post-procedure vital signs reviewed and stable Respiratory status: spontaneous breathing, nonlabored ventilation, respiratory function stable and patient connected to nasal cannula oxygen Cardiovascular status: blood pressure returned to baseline and stable Postop Assessment: no apparent nausea or vomiting Anesthetic complications: no   No notable events documented.  Last Vitals:  Vitals:   02/07/21 1213 02/07/21 1458  BP: (!) 147/85 125/79  Pulse: 98 (!) 104  Resp: 15 11  Temp: 36.5 C 37.2 C  SpO2: 99% 98%    Last Pain:  Vitals:   02/07/21 1510  TempSrc:   PainSc: Asleep                 Ridgely Anastacio,W. EDMOND

## 2021-02-07 NOTE — Op Note (Signed)
[facility] Patient Name: Kimberly Kelly Procedure Date : 02/07/2021 MRN: [id number] Attending MD: Justice Britain , MD Date of Birth: October 09, 1970 CSN: [id number] Age: 50 Admit Type: Inpatient Procedure:                Upper EUS Indications:              Pancreatic cyst on CT scan Providers:                Justice Britain, MD, Josie Dixon, RN, Cletis Athens, Technician, Trixie Deis, CRNA Referring MD:             Clarene Essex, MD, [facility] Medicines:                General Anesthesia Complications:            No immediate complications. Estimated Blood Loss:     Estimated blood loss was minimal. Procedure:                Pre-Anesthesia Assessment:                           - Prior to the procedure, a History and Physical                            was performed, and patient medications and                            allergies were reviewed. The patient's tolerance of                            previous anesthesia was also reviewed. The risks                            and benefits of the procedure and the sedation                            options and risks were discussed with the patient.                            All questions were answered, and informed consent                            was obtained. Prior Anticoagulants: The patient has                            taken Lovenox (enoxaparin), last dose was 1 day                            prior to procedure. ASA Grade Assessment: III - A                            patient with severe systemic disease. After  reviewing the risks and benefits, the patient was                            deemed in satisfactory condition to undergo the                            procedure.                           After obtaining informed consent, the endoscope was                            passed under direct vision. Throughout the                            procedure,  the patient's blood pressure, pulse, and                            oxygen saturations were monitored continuously. The                            [id number]-H190 ([id number]) Olympus gastroscope was                            introduced through the mouth, and advanced to the                            second part of duodenum. The [id number]-[id number] ([id number])                            Olympus Duodenoscope was introduced through the                            mouth, and advanced to the area of papilla. The                            [id number] ([id number]) Olympus Linear EUS scope was                            introduced through the mouth, and advanced to the                            duodenum for ultrasound examination from the                            stomach and duodenum. The upper EUS was                            accomplished without difficulty. The patient                            tolerated the procedure. Scope In: Scope Out: Findings:      ENDOSCOPIC FINDING: :      No gross lesions were noted in the entire esophagus.  The Z-line was irregular and was found 36 cm from the incisors.      Striped moderately erythematous mucosa without bleeding was found in the       entire examined stomach. Biopsies were taken with a cold forceps for       histology and Helicobacter pylori testing.      No gross lesions were noted in the duodenal bulb, in the first portion       of the duodenum and in the second portion of the duodenum.      There was evidence of a patent previous surgical anastomosis in the       major papilla (it was found hiding under a hood).      ENDOSONOGRAPHIC FINDING: :      A hypoechoic, septated, shadowing and distally enhancing lesion       suggestive of a cyst/walled off necrosis was identified in the genu of       the pancreas extending through the pancreatic body and pancreatic tail.       The lesion measured 80 mm by 18 mm in maximal cross-sectional diameter.       There were a  few compartments thickly septated with an area that had a       larger cyst cavity and walled off necrosis almost 45 mm in size. The       outer wall of the lesion was thin but seemed appropriate in size. There       was no associated mass-lesion. There was internal debris within the       fluid-filled cavity consistent with significant pancreatic necrosis. I       evaluated the region for nearly 30 minutes to see if an adequate area       for cystgastrostomy creation could be performed. At around 45-47 cm       there is an area of smaller width that may fit an AXIOS, but more       concerning is a large blood vessel that is abutting and has potential       invasion into the cyst. Suspect a tributary for splenic artery, but if       this is a pseudoaneurysm, this would need to be intervened upon before       proceeding with cystgastrostomy creation. I did not proceed with attempt       today as such.      There was no sign of significant endosonographic abnormality in the       common bile duct and in the common hepatic duct. Ducts with regular       contour were identified.      Endosonographic imaging in the visualized portion of the liver showed no       mass.      A few enlarged lymph nodes were visualized in the perigastric region.       The largest measured 8 mm by 6 mm in maximal cross-sectional diameter.       The nodes were oval, isoechoic and had well defined margins.      The celiac region was visualized. Impression:               EGD Impression:                           - No gross lesions in esophagus - esophagitis has  healed.                           - Z-line irregular, 36 cm from the incisors.                           - Erythematous mucosa in the stomach. Biopsied.                           - No gross lesions in the duodenal bulb, in the                            first portion of the duodenum and in the second                             portion of the duodenum.                           - Patent sphincterotomy at ampulla.                           EUS Impression:                           - A cystic lesion was seen in the genu of the                            pancreas extending to the pancreatic body and                            pancreatic tail. As noted above this is consistent                            with a pseudocyst with pretty significant amount of                            walled off necrosis. The width of the long cavity                            is small between 10 mm -> 18 mm (this could hold an                            AXIOS but will be tight with initial placement). A                            larger collection may be able to be intervened with                            significant necrosis. However, there is a large                            blood vessel that abuts and looks on EUS to be  within the cyst cavity - query tributary off                            splenic artery or a possible pseudoaneurysm.                            Cystgastrostomy creation not attempted due to                            concern as noted.                           - There was no sign of significant pathology in the                            common bile duct and in the common hepatic duct.                           - A few enlarged lymph nodes were visualized in the                            perigastric region. Tissue has not been obtained.                            However, the endosonographic appearance is                            suggestive of benign inflammatory changes. Recommendation:           - The patient will be observed post-procedure,                            until all discharge criteria are met.                           - Return patient to hospital ward for ongoing care.                           - Resume previous diet.                           - Observe patient's  clinical course.                           - Proceed with a CT-Abdomen Angiogram to better                            define the vasculature of the abdomen and ensure no                            evidence of development of a pseudoaneurysm. If                            not, then repeat attempt at Cystgastrostomy can be  performed next week.                           - Observe patient's clinical course.                           - Continue current medications.                           - The findings and recommendations were discussed                            with the patient.                           - The findings and recommendations were discussed                            with the patient's family. Procedure Code(s):        --- Professional ---                           [phone], Esophagogastroduodenoscopy, flexible,                            transoral; with endoscopic ultrasound examination                            limited to the esophagus, stomach or duodenum, and                            adjacent structures                           43239, Esophagogastroduodenoscopy, flexible,                            transoral; with biopsy, single or multiple Diagnosis Code(s):        --- Professional ---                           K22.8, Other specified diseases of esophagus                           K31.89, Other diseases of stomach and duodenum                           Z98.0, Intestinal bypass and anastomosis status                           R59.0, Localized enlarged lymph nodes                           K86.2, Cyst of pancreas CPT copyright 2019 [employer]. All rights reserved. The codes documented in this report are preliminary and upon coder review may  be revised to meet current compliance requirements. Justice Britain, MD 02/07/2021 3:33:03 PM Number of Addenda: 0

## 2021-02-07 NOTE — Progress Notes (Signed)
PROGRESS NOTE    Kimberly Kelly  KGM:[id number] DOB: 1971-04-09 DOA: 01/31/2021 PCP: [name], FNP   Brief Narrative:  Ms. Obremski is a 50 yo woman with a hx of anxiety/depression, hx of lung nodules, recent pancreatitis, here with n/v, generalized abdominal pain, fever. Acute intractable nausea a vomiting 24h prior to admission. Took tylenol and zofran with no improvement. Recently hospitalized from 6/7-6/22 at [facility] with post ERCP pancreatitis. Diarrhea during hospitalization. ARF during hospitalization - atelectasis, pleural effusions (diuresed), transaminases, AKI, sinus tach.  Transition to [facility] for pancreatitis with concern for pancreatic necrosis and large pancreatic pseudocyst for interventional GI follow-up and work-up.  Continue broad-spectrum antibiotics with meropenem, pain currently well controlled and diet on hold.  Procedure with Dr. Alessandra Bevels on 02/07/2021.    Likely disposition home post procedure when stabilizing, table tolerate p.o. safely and once pain is well controlled on p.o. medications.   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 - Continue meropenem - Blood cultures remain negative - Pain control with morphine IV & oxycodone PRN. Recommend zofran with oxycodone - GI following, plan for intervention with Dr. Alessandra Bevels today around noon - Anticoagulation being held, should be able to resume 48 hours postprocedure pending complications per GI   Coffee ground emesis in the setting of GERD without history of GI bleed, resolved Acute blood loss anemia,  stabilizing On EGD 02/01/21: moderately severe esophagitis, one superficial esophageal ulcer. Diffuse mild inflammation due to vascular congestion in stomach. Extrinsic compression of stomach by psudeocyst. -Continue heparin as below, follow closely for repeat episodes of bleeding -PPI twice daily, advance diet as tolerated in setting of above  Hyperglycemia, concern for endocrine pancreatic dysfunction -No known history of diabetes or hyper/hypoglycemic episodes  -SSI PRN -goal BG 140-180 if requiring insulin -High risk for pancreatic destruction and subsequent diabetes, will need close outpatient follow-up; consider glucose monitor at discharge   Mild AKI, resolved -In the setting of above, advance diet as tolerated, IV fluids ongoing    UTI: asymptomatic. -Covered by meropenem as above  Portal Vein thrombosis -Transition from heparin drip to Lovenox given IV issues and labile heparin levels -likely transition to DOAC at discharge   Incidentally noted pulmonary nodules  -OP follow up as previously scheduled   Anxiety/depression -Continue home lexapro  DVT prophylaxis: Lovenox, full dose Code Status: Full Family Communication: Husband at bedside  Status is: Inpatient  Dispo: The patient is from: Home              Anticipated d/c is to: Home              Anticipated d/c date is: 48-72 hours pending procedure and post procedure care              Patient currently not medically stable for discharge  Consultants:  GI, PCCM  Procedures:  Endoscopy planned 02/07/2021  Antimicrobials:  Meropenem  Subjective: No acute issues or events overnight, worsening diet and symptoms of nausea yesterday, currently n.p.o. since midnight for procedure and states she feels somewhat fatigued and weak but denies any overt pain nausea vomiting diarrhea constipation headache fevers or chills.  Objective: Vitals:   02/06/21 2042 02/06/21 2043 02/07/21 0335 02/07/21 0649  BP: 134/86 134/86  (!)  131/92  Pulse: (!) 118 (!) 118  99  Resp: 16   20  Temp: 98 F (36.7 C)   97.7 F (36.5 C)  TempSrc: Oral     SpO2: 97% 96%  95%  Weight:   87.5 kg   Height:       No intake or output data in the 24 hours ending 02/07/21 0735  Filed Weights   02/05/21 0448 02/06/21 0500 02/07/21 0335  Weight: 87.2 kg 88.6 kg 87.5 kg    Examination:  General:  Pleasantly resting in bed, No acute distress. HEENT:  Normocephalic atraumatic.  Sclerae nonicteric, noninjected.  Extraocular movements intact bilaterally. Neck:  Without mass or deformity.  Trachea is midline. Lungs:  Clear to auscultate bilaterally without rhonchi, wheeze, or rales. Heart:  Regular rate and rhythm.  Without murmurs, rubs, or gallops. Abdomen:  Soft, minimally tender diffusely, nondistended.  Without guarding or rebound. Extremities: Without cyanosis, clubbing, edema, or obvious deformity. Vascular:  Dorsalis pedis and posterior tibial pulses palpable bilaterally. Skin:  Warm and dry, no erythema, no ulcerations.  Data Reviewed: I have personally reviewed following labs and imaging studies  CBC: Recent Labs  Lab 01/31/21 1539 02/01/21 0201 02/01/21 0452 02/03/21 0755 02/04/21 0042 02/05/21 0055 02/06/21 0325 02/07/21 0157  WBC 16.5* 12.4*   < > 6.9 5.3 4.5 10.0 7.0  NEUTROABS 14.7* 10.9*  --   --   --   --   --   --   HGB 12.0 9.8*   < > 10.0* 8.7* 8.5* 8.6* 8.4*  HCT 36.3 30.4*   < > 31.0* 26.9* 26.3* 27.0* 26.0*  MCV 86.2 87.4   < > 88.3 87.9 87.4 87.9 87.8  PLT 277 227   < > 278 255 270 322 322   < > = values in this interval not displayed.    Basic Metabolic Panel: Recent Labs  Lab 02/01/21 0452 02/02/21 0723 02/03/21 0755 02/04/21 0042 02/05/21 0055 02/06/21 0325 02/07/21 0157  NA 141 139 139 137 139 139 139  K 3.7 3.9 3.6 3.1* 3.6 3.7 3.4*  CL 104 103 103 101 104 103 101  CO2 24 24 26 29 29 30  35*  GLUCOSE 174* 128* 114* 121* 126* 127* 131*  BUN 10 8 6  <5* <5* <5* <5*  CREATININE 1.19*  1.09* 1.03* 0.95 0.90 0.92 0.88  CALCIUM 8.4* 8.2* 8.3* 8.0* 8.3* 8.3* 8.2*  MG 1.7 2.1  --   --   --   --   --     GFR: Estimated Creatinine Clearance: 86.9 mL/min (by C-G formula based on SCr of 0.88 mg/dL). Liver Function Tests: Recent Labs  Lab 01/31/21 1539 02/04/21 0042  AST 36 12*  ALT 61* 18  ALKPHOS 250* 115  BILITOT 0.7 0.7  PROT 7.4 5.2*  ALBUMIN 3.6 2.1*    Recent Labs  Lab 01/31/21 1539 02/01/21 0201  LIPASE 83* 74*    No results for input(s): AMMONIA in the last 168 hours. Coagulation Profile: Recent Labs  Lab 02/03/21 0755 02/04/21 0042 02/05/21 0055 02/06/21 0325 02/07/21 0157  INR 1.1 1.1 1.3* 1.3* 1.2    Cardiac Enzymes: No results for input(s): CKTOTAL, CKMB, CKMBINDEX, TROPONINI in the last 168 hours. BNP (last 3 results) No results for input(s): PROBNP in the last 8760 hours. HbA1C: No results for input(s): HGBA1C in the last 72 hours. CBG: Recent  Labs  Lab 02/06/21 1156 02/06/21 1514 02/06/21 1934 02/07/21 0050 02/07/21 0334  GLUCAP 116* 137* 152* 126* 145*    Lipid Profile: No results for input(s): CHOL, HDL, LDLCALC, TRIG, CHOLHDL, LDLDIRECT in the last 72 hours. Thyroid Function Tests: No results for input(s): TSH, T4TOTAL, FREET4, T3FREE, THYROIDAB in the last 72 hours. Anemia Panel: No results for input(s): VITAMINB12, FOLATE, FERRITIN, TIBC, IRON, RETICCTPCT in the last 72 hours. Sepsis Labs: Recent Labs  Lab 02/01/21 0201 02/01/21 0202 02/01/21 0452  PROCALCITON 17.13  --   --   LATICACIDVEN  --  0.8 0.9     Recent Results (from the past 240 hour(s))  Resp Panel by RT-PCR (Flu A&B, Covid) Nasopharyngeal Swab     Status: None   Collection Time: 01/31/21  7:47 PM   Specimen: Nasopharyngeal Swab; Nasopharyngeal(NP) swabs in vial transport medium  Result Value Ref Range Status   SARS Coronavirus 2 by RT PCR NEGATIVE NEGATIVE Final    Comment: (NOTE) SARS-CoV-2 target nucleic acids are NOT DETECTED.  The  SARS-CoV-2 RNA is generally detectable in upper respiratory specimens during the acute phase of infection. The lowest concentration of SARS-CoV-2 viral copies this assay can detect is 138 copies/mL. A negative result does not preclude SARS-Cov-2 infection and should not be used as the sole basis for treatment or other patient management decisions. A negative result may occur with  improper specimen collection/handling, submission of specimen other than nasopharyngeal swab, presence of viral mutation(s) within the areas targeted by this assay, and inadequate number of viral copies(<138 copies/mL). A negative result must be combined with clinical observations, patient history, and epidemiological information. The expected result is Negative.  Fact Sheet for Patients:  EntrepreneurPulse.com.au  Fact Sheet for Healthcare Providers:  IncredibleEmployment.be  This test is no t yet approved or cleared by the [place] FDA and  has been authorized for detection and/or diagnosis of SARS-CoV-2 by FDA under an Emergency Use Authorization (EUA). This EUA will remain  in effect (meaning this test can be used) for the duration of the COVID-19 declaration under Section 564(b)(1) of the Act, 21 U.S.C.section 360bbb-3(b)(1), unless the authorization is terminated  or revoked sooner.       Influenza A by PCR NEGATIVE NEGATIVE Final   Influenza B by PCR NEGATIVE NEGATIVE Final    Comment: (NOTE) The Xpert Xpress SARS-CoV-2/FLU/RSV plus assay is intended as an aid in the diagnosis of influenza from Nasopharyngeal swab specimens and should not be used as a sole basis for treatment. Nasal washings and aspirates are unacceptable for Xpert Xpress SARS-CoV-2/FLU/RSV testing.  Fact Sheet for Patients: EntrepreneurPulse.com.au  Fact Sheet for Healthcare Providers: IncredibleEmployment.be  This test is not yet approved or  cleared by the [place] FDA and has been authorized for detection and/or diagnosis of SARS-CoV-2 by FDA under an Emergency Use Authorization (EUA). This EUA will remain in effect (meaning this test can be used) for the duration of the COVID-19 declaration under Section 564(b)(1) of the Act, 21 U.S.C. section 360bbb-3(b)(1), unless the authorization is terminated or revoked.  Performed at [facility], [address] [place], [place] [address]   MRSA Next Gen by PCR, Nasal     Status: None   Collection Time: 02/01/21 12:26 AM   Specimen: Nasal Mucosa; Nasal Swab  Result Value Ref Range Status   MRSA by PCR Next Gen NOT DETECTED NOT DETECTED Final    Comment: (NOTE) The GeneXpert MRSA Assay (FDA approved for NASAL specimens only), is  one component of a comprehensive MRSA colonization surveillance program. It is not intended to diagnose MRSA infection nor to guide or monitor treatment for MRSA infections. Test performance is not FDA approved in patients less than 59 years old. Performed at [facility], [place] [address], [place], [place] [address]   Culture, blood (routine x 2)     Status: None   Collection Time: 02/01/21  2:10 AM   Specimen: BLOOD  Result Value Ref Range Status   Specimen Description BLOOD LEFT ANTECUBITAL  Final   Special Requests   Final    BOTTLES DRAWN AEROBIC AND ANAEROBIC Blood Culture results may not be optimal due to an inadequate volume of blood received in culture bottles   Culture   Final    NO GROWTH 5 DAYS Performed at [facility], [place] [address] [place], [place] [address]    Report Status 02/06/2021 FINAL  Final  Culture, blood (routine x 2)     Status: None   Collection Time: 02/01/21  2:21 AM   Specimen: BLOOD LEFT HAND  Result Value Ref Range Status   Specimen Description BLOOD LEFT HAND  Final   Special Requests   Final    BOTTLES DRAWN AEROBIC AND ANAEROBIC Blood Culture adequate volume   Culture    Final    NO GROWTH 5 DAYS Performed at [facility], [place] [address], [place], [place] [address]    Report Status 02/06/2021 FINAL  Final     Radiology Studies: No results found.  Scheduled Meds:  Chlorhexidine Gluconate Cloth  6 each Topical Daily   escitalopram  10 mg Oral Daily   feeding supplement  237 mL Oral BID BM   insulin aspart  1-3 Units Subcutaneous Q4H   pantoprazole (PROTONIX) IV  40 mg Intravenous Q12H   Continuous Infusions:  lactated ringers 50 mL/hr at 02/04/21 1421   meropenem (MERREM) IV 1 g (02/07/21 0046)   promethazine (PHENERGAN) injection (IM or IVPB)      LOS: 6 days   Time spent: 52min  Kimberly Villagran C Liesl Simons, DO [facility]  If 7PM-7AM, please contact night-coverage [email or web address]  02/07/2021, 7:35 AM

## 2021-02-07 NOTE — Transfer of Care (Signed)
Immediate Anesthesia Transfer of Care Note  Patient: Kimberly Kelly  Procedure(s) Performed: ESOPHAGOGASTRODUODENOSCOPY (EGD) WITH PROPOFOL UPPER ESOPHAGEAL ENDOSCOPIC ULTRASOUND (EUS) (Left) BIOPSY  Patient Location: Endoscopy Unit  Anesthesia Type:General  Level of Consciousness: awake, alert  and oriented  Airway & Oxygen Therapy: Patient Spontanous Breathing and Patient connected to nasal cannula oxygen  Post-op Assessment: Report given to RN and Post -op Vital signs reviewed and stable  Post vital signs: Reviewed and stable  Last Vitals:  Vitals Value Taken Time  BP 125/79 02/07/21 1458  Temp    Pulse 100 02/07/21 1459  Resp 15 02/07/21 1459  SpO2 97 % 02/07/21 1459  Vitals shown include unvalidated device data.  Last Pain:  Vitals:   02/07/21 1213  TempSrc: Oral  PainSc: 6       Patients Stated Pain Goal: 0 (44/69/50 7225)  Complications: No notable events documented.

## 2021-02-07 NOTE — Anesthesia Procedure Notes (Signed)
Procedure Name: Intubation Date/Time: 02/07/2021 1:37 PM Performed by: Leonor Liv, CRNA Pre-anesthesia Checklist: Patient identified, Emergency Drugs available, Suction available and Patient being monitored Patient Re-evaluated:Patient Re-evaluated prior to induction Oxygen Delivery Method: Circle System Utilized Preoxygenation: Pre-oxygenation with 100% oxygen Induction Type: IV induction Ventilation: Mask ventilation without difficulty Laryngoscope Size: Mac and 4 Grade View: Grade I Tube type: Oral Tube size: 7.0 mm Number of attempts: 1 Airway Equipment and Method: Stylet and Oral airway Placement Confirmation: ETT inserted through vocal cords under direct vision, positive ETCO2 and breath sounds checked- equal and bilateral Secured at: 21 cm Tube secured with: Tape Dental Injury: Teeth and Oropharynx as per pre-operative assessment

## 2021-02-07 NOTE — Interval H&P Note (Signed)
History and Physical Interval Note:  02/07/2021 1:26 PM  Kimberly Kelly  has presented today for surgery, with the diagnosis of Pancreatic pseudocyst.  The various methods of treatment have been discussed with the patient and family. After consideration of risks, benefits and other options for treatment, the patient has consented to  Procedure(s): ESOPHAGOGASTRODUODENOSCOPY (EGD) WITH PROPOFOL (N/A) UPPER ESOPHAGEAL ENDOSCOPIC ULTRASOUND (EUS) (Left) CYST ENTEROSTOMY (N/A) as a surgical intervention.  The patient's history has been reviewed, patient examined, no change in status, stable for surgery.  I have reviewed the patient's chart and labs.  Questions were answered to the patient's satisfaction.    The risks of an EUS, including intestinal perforation, bleeding, infection, aspiration, and medication effects were discussed.  When a cystgastrostomy/cystenterostomy is performed as part of the EUS, there is an additional risk of pancreatitis at the rate of about 1-2%.  It was explained that procedure related pancreatitis is typically mild, although at times it can be severe and even life threatening.  Extensive discussion with patient about her clinical history and her husband.    [employer]

## 2021-02-07 NOTE — Anesthesia Preprocedure Evaluation (Addendum)
Anesthesia Evaluation  Patient identified by MRN, date of birth, ID band Patient awake    Reviewed: Allergy & Precautions, NPO status , Patient's Chart, lab work & pertinent test results  Airway Mallampati: III  TM Distance: >3 FB Neck ROM: Full    Dental no notable dental hx. (+) Dental Advisory Given, Teeth Intact   Pulmonary neg pulmonary ROS,    Pulmonary exam normal breath sounds clear to auscultation       Cardiovascular negative cardio ROS Normal cardiovascular exam Rhythm:Regular Rate:Normal     Neuro/Psych PSYCHIATRIC DISORDERS Anxiety Depression negative neurological ROS     GI/Hepatic Neg liver ROS, GERD  ,  Endo/Other  negative endocrine ROS  Renal/GU negative Renal ROS  negative genitourinary   Musculoskeletal  (+) Arthritis ,   Abdominal   Peds  Hematology negative hematology ROS (+)   Anesthesia Other Findings   Reproductive/Obstetrics                            Anesthesia Physical  Anesthesia Plan  ASA: 2  Anesthesia Plan: General   Post-op Pain Management:    Induction: Intravenous  PONV Risk Score and Plan: 2 and Treatment may vary due to age or medical condition, Propofol infusion and TIVA  Airway Management Planned: Oral ETT  Additional Equipment: None  Intra-op Plan:   Post-operative Plan:   Informed Consent: I have reviewed the patients History and Physical, chart, labs and discussed the procedure including the risks, benefits and alternatives for the proposed anesthesia with the patient or authorized representative who has indicated his/her understanding and acceptance.     Dental advisory given  Plan Discussed with: CRNA  Anesthesia Plan Comments:       Anesthesia Quick Evaluation

## 2021-02-08 LAB — GLUCOSE, CAPILLARY
Glucose-Capillary: 108 mg/dL — ABNORMAL HIGH (ref 70–99)
Glucose-Capillary: 121 mg/dL — ABNORMAL HIGH (ref 70–99)
Glucose-Capillary: 122 mg/dL — ABNORMAL HIGH (ref 70–99)
Glucose-Capillary: 126 mg/dL — ABNORMAL HIGH (ref 70–99)
Glucose-Capillary: 148 mg/dL — ABNORMAL HIGH (ref 70–99)
Glucose-Capillary: 94 mg/dL (ref 70–99)

## 2021-02-08 LAB — BASIC METABOLIC PANEL
Anion gap: 8 (ref 5–15)
BUN: 7 mg/dL (ref 6–20)
CO2: 29 mmol/L (ref 22–32)
Calcium: 8.2 mg/dL — ABNORMAL LOW (ref 8.9–10.3)
Chloride: 103 mmol/L (ref 98–111)
Creatinine, Ser: 0.82 mg/dL (ref 0.44–1.00)
GFR, Estimated: >60 mL/min (ref >60)
Glucose, Bld: 126 mg/dL — ABNORMAL HIGH (ref 70–99)
Potassium: 5.9 mmol/L — ABNORMAL HIGH (ref 3.5–5.1)
Sodium: 140 mmol/L (ref 135–145)

## 2021-02-08 LAB — CBC
HCT: 28.5 % — ABNORMAL LOW (ref 36.0–46.0)
Hemoglobin: 9.2 g/dL — ABNORMAL LOW (ref 12.0–15.0)
MCH: 28.4 pg (ref 26.0–34.0)
MCHC: 32.3 g/dL (ref 30.0–36.0)
MCV: 88 fL (ref 80.0–100.0)
Platelets: 451 10*3/uL — ABNORMAL HIGH (ref 150–400)
RBC: 3.24 MIL/uL — ABNORMAL LOW (ref 3.87–5.11)
RDW: 13.2 % (ref 11.5–15.5)
WBC: 6 10*3/uL (ref 4.0–10.5)
nRBC: 0 % (ref 0.0–0.2)

## 2021-02-08 LAB — POTASSIUM: Potassium: 3.7 mmol/L (ref 3.5–5.1)

## 2021-02-08 LAB — PROTIME-INR
INR: 1.2 (ref 0.8–1.2)
Prothrombin Time: 15 seconds (ref 11.4–15.2)

## 2021-02-08 MED ORDER — ENOXAPARIN SODIUM 100 MG/ML IJ SOSY
90.0000 mg | PREFILLED_SYRINGE | Freq: Two times a day (BID) | INTRAMUSCULAR | Status: DC
  Administered 2021-02-08 – 2021-02-10 (×5): 90 mg via SUBCUTANEOUS
  Filled 2021-02-08 (×6): qty 1

## 2021-02-08 NOTE — Progress Notes (Signed)
[facility] Gastroenterology Progress Note  Kimberly Kelly 50 y.o. [birth year]  CC: Complicated pancreatitis with large pseudocyst   Subjective: Patient seen and examined at bedside.  Feeling better.  Lower abdominal pain has improved but continues to have substernal and epigastric pressure sensation in discomfort.  Complaining of nausea but denied any vomiting.  ROS : Febrile, negative for chest pain   Objective: Vital signs in last 24 hours: Vitals:   02/07/21 1600 02/07/21 2119  BP: 134/90 127/83  Pulse: 93 87  Resp: 12 15  Temp: 98.4 F (36.9 C) 97.8 F (36.6 C)  SpO2: 98% 95%    Physical Exam:  General:  Alert, cooperative, no distress, appears stated age  Head:  Normocephalic, without obvious abnormality, atraumatic  Eyes:  , EOM's intact,   Lungs:   Clear to auscultation bilaterally, respirations unlabored  Heart:  Regular rate and rhythm, S1, S2 normal  Abdomen:   Soft, upper quadrant discomfort on palpation without any significant tenderness, bowel sounds active all four quadrants,  no masses,   Extremities: Extremities normal, atraumatic, no  edema  Pulses: 2+ and symmetric    Lab Results: Recent Labs    02/07/21 0157 02/08/21 0051 02/08/21 0758  NA 139 140  --   K 3.4* 5.9* 3.7  CL 101 103  --   CO2 35* 29  --   GLUCOSE 131* 126*  --   BUN <5* 7  --   CREATININE 0.88 0.82  --   CALCIUM 8.2* 8.2*  --    No results for input(s): AST, ALT, ALKPHOS, BILITOT, PROT, ALBUMIN in the last 72 hours. Recent Labs    02/07/21 0157 02/08/21 0051  WBC 7.0 6.0  HGB 8.4* 9.2*  HCT 26.0* 28.5*  MCV 87.8 88.0  PLT 322 451*   Recent Labs    02/07/21 0157 02/08/21 0307  LABPROT 15.6* 15.0  INR 1.2 1.2      Assessment/Plan: -Complicated pancreatitis with pancreatic necrosis and  large pseudocyst. -Portal vein thrombosis.  On Lovenox  Recommendations ------------------------ - CT Angio report reviewed.  No arterial abnormality or pseudoaneurysm noted -We  will discuss with Dr. Rush [facility] for plan for repeat cystogastrostomy sometime next week. -Okay to resume anticoagulation -Start low-fat diet.  Patient was advised to eat small portions.   Otis Brace MD, [facility] 02/08/2021, 9:46 AM  Contact #  [phone]

## 2021-02-08 NOTE — Progress Notes (Signed)
Per tele report, patient Sinus Tach 113.

## 2021-02-08 NOTE — Progress Notes (Signed)
Per tele, patient sustaining Sinus Tach 130s. Pt ambulating halls with husband at this time.

## 2021-02-08 NOTE — Progress Notes (Signed)
Patient requesting to take a shower. MD notified and orders received for removal of tele to shower.

## 2021-02-08 NOTE — Progress Notes (Signed)
[facility] for Lovenox Indication:  Portal vein thrombus  No Known Allergies  Patient Measurements: Height: 5\' 7"  (170.2 cm) Weight: 87.5 kg (192 lb 14.4 oz) IBW/kg (Calculated) : 61.6   Vital Signs:    Labs: Recent Labs    02/06/21 0325 02/07/21 0157 02/08/21 0051 02/08/21 0307  HGB 8.6* 8.4* 9.2*  --   HCT 27.0* 26.0* 28.5*  --   PLT 322 322 451*  --   LABPROT 15.9* 15.6*  --  15.0  INR 1.3* 1.2  --  1.2  CREATININE 0.92 0.88 0.82  --      Estimated Creatinine Clearance: 93.3 mL/min (by C-G formula based on SCr of 0.82 mg/dL).   Assessment: 50 yo female with new nonocclusive thrombus in distal main and proximal left portal veins per CT. Pharmacy consulted for Lovenox.  Stopped 7/7 AM in prep for EGD.  -s/p EGD 7/8 -  Plan was to start DOAC 48 h s/p EGD x 3-6 mo? (?start 7/10 PM). However on Saturday 7/9 GI ordered to resume lovenox  and noted may need repeat cystogastrostomy sometime next week.  No bleeding noted, Hgb 9.2 stable, pltc wnl/high.   Goal of Therapy:  Anti-Xa level 0.6-1 units/ml 4hrs after LMWH dose given Monitor platelets by anticoagulation protocol: Yes   Plan:  GI MD ordered okay to resume Lovenox today 7/9. Restart Lovenox 90 mg sq q12h  (wt is 87.5 kg) F/U transition to oral agent once procedures complete  Thank you for involving pharmacy in this patient's care.  Nicole Cella, RPh Clinical Pharmacist Please check AMION for all [facility] phone numbers After 10:00 PM, call [facility] [phone]  02/08/2021 10:18 AM

## 2021-02-08 NOTE — Progress Notes (Signed)
PROGRESS NOTE    Kimberly Kelly  ERX:[id number] DOB: 05/14/1971 DOA: 01/31/2021 PCP: [name], FNP   Chief Complain:Abd pain  Brief Narrative:  Patient is a 50 yo woman with a hx of anxiety/depression, hx of lung nodules, recent pancreatitis, here with n/v, generalized abdominal pain, fever. Acute intractable nausea a vomiting 24h prior to admission. Took tylenol and zofran with no improvement. Recently hospitalized from 6/7-6/22 at [facility] with post ERCP pancreatitis. Marland KitchenShe was transferred to [facility] for pancreatitis with concern for pancreatic necrosis and large pancreatic pseudocyst for interventional GI follow-up and work-up.  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 - Continue meropenem - Blood cultures remain negative - Pain control with morphine IV & oxycodone PRN. Recommend zofran with oxycodone - GI following.she underwent upper endoscopic ultrasound on 02/07/2021  -plan for repeat cystogastrostomy next week   coffee ground emesis in the setting of GERD without history of GI bleed, resolved Acute blood loss anemia, stabilizing On EGD 02/01/21: moderately severe esophagitis, one superficial esophageal ulcer. Diffuse mild inflammation due to vascular congestion in stomach. Extrinsic compression of stomach by psudeocyst. -Continue PPI twice daily, on low fat diet  -Recommended to eat small portion of meals -Hb stable   Hyperglycemia, concern for endocrine pancreatic dysfunction -No known history of diabetes or hyper/hypoglycemic  episodes  -SSI PRN -goal BG 140-180 if requiring insulin -High risk for pancreatic destruction and subsequent diabetes, will need close outpatient follow-up; consider glucose monitor at discharge    UTI: asymptomatic. -Covered by meropenem as above   Portal Vein thrombosis -Transitioned from heparin drip to Lovenox given IV issues and labile heparin levels -likely transition to DOAC at discharge   Incidentally noted pulmonary nodules -OP follow up as previously scheduled   Anxiety/depression -Continue home lexapro  Bilateral lower extremity swelling Most likely secondary to IV fluids that she received during the hospitalization.  I have recommended her to  keep her legs elevated while in bed           DVT prophylaxis:Lovenox Code Status: None Family Communication: family member present at bedside Status is: Inpatient Dispo: The patient is from: Home              Anticipated d/c is to: Home              Patient currently is not medically stable to d/c.   Difficult to place patient No     Consultants: GI  Procedures:Endoscopic [place]  Antimicrobials:  Anti-infectives (From admission, onward)    Start     Dose/Rate Route Frequency Ordered Stop   02/01/21 1030  meropenem (MERREM) 1 g in sodium chloride 0.9 % 100 mL IVPB        1 g 200 mL/hr over 30 Minutes Intravenous Every 8 hours 02/01/21 0938     02/01/21 1000  ceFEPIme (MAXIPIME) 2 g in sodium chloride 0.9 % 100 mL IVPB  Status:  Discontinued        2 g 200 mL/hr over 30 Minutes Intravenous Every 12 hours 02/01/21 0203 02/01/21 0937   02/01/21 0600  metroNIDAZOLE (FLAGYL)  IVPB 500 mg  Status:  Discontinued        500 mg 100 mL/hr over 60 Minutes Intravenous Every 8 hours 02/01/21 0200 02/01/21 0912   01/31/21 2030  ceFEPIme (MAXIPIME) 2,000 mg in sodium chloride 0.9 % 100 mL IVPB        2,000 mg 200 mL/hr over 30 Minutes Intravenous  Once 01/31/21 2025 01/31/21 2218   01/31/21 2030  metroNIDAZOLE (FLAGYL) IVPB  500 mg        500 mg 100 mL/hr over 60 Minutes Intravenous  Once 01/31/21 2025 01/31/21 2217   01/31/21 1645  cefTRIAXone (ROCEPHIN) 1 g in sodium chloride 0.9 % 100 mL IVPB        1 g 200 mL/hr over 30 Minutes Intravenous  Once 01/31/21 1639 01/31/21 1736       Subjective:  Patient seen and examined the bedside this morning.  Hemodynamically  stable.  She just took a shower.  She feels better today.  She is still complaining of upper abdominal, lower chest discomfort   Objective: Vitals:   02/07/21 1213 02/07/21 1458 02/07/21 1600 02/07/21 2119  BP: (!) 147/85 125/79 134/90 127/83  Pulse: 98 (!) 104 93 87  Resp: 15 11 12 15   Temp: 97.7 F (36.5 C) 99 F (37.2 C) 98.4 F (36.9 C) 97.8 F (36.6 C)  TempSrc: Oral Axillary Oral Oral  SpO2: 99% 98% 98% 95%  Weight: 87.5 kg     Height: 5\' 7"  (1.702 m)       Intake/Output Summary (Last 24 hours) at 02/08/2021 1127 Last data filed at 02/08/2021 0300 Gross per 24 hour  Intake 2760.71 ml  Output --  Net 2760.71 ml   Filed Weights   02/06/21 0500 02/07/21 0335 02/07/21 1213  Weight: 88.6 kg 87.5 kg 87.5 kg    Examination:  General exam: Overall comfortable, not in distress HEENT: PERRL Respiratory system:  no wheezes or crackles  Cardiovascular system: S1 & S2 heard, RRR.  Gastrointestinal system: Abdomen is nondistended, soft .  Mild tenderness on the left upper quadrant. Central nervous system: Alert and oriented Extremities: No edema, no clubbing ,no cyanosis Skin: No rashes, no ulcers,no icterus     Data Reviewed: I have personally reviewed following labs and imaging studies  CBC: Recent Labs  Lab 02/04/21 0042 02/05/21 0055 02/06/21 0325 02/07/21 0157 02/08/21 0051  WBC 5.3 4.5 10.0 7.0 6.0  HGB 8.7* 8.5* 8.6* 8.4* 9.2*  HCT 26.9* 26.3* 27.0* 26.0* 28.5*  MCV 87.9 87.4 87.9 87.8 88.0  PLT 255 270 322 322 151*   Basic Metabolic Panel: Recent Labs  Lab 02/02/21 0723 02/03/21 0755 02/04/21 0042  02/05/21 0055 02/06/21 0325 02/07/21 0157 02/08/21 0051 02/08/21 0758  NA 139   < > 137 139 139 139 140  --   K 3.9   < > 3.1* 3.6 3.7 3.4* 5.9* 3.7  CL 103   < > 101 104 103 101 103  --   CO2 24   < > 29 29 30  35* 29  --   GLUCOSE 128*   < > 121* 126* 127* 131* 126*  --   BUN 8   < > <5* <5* <5* <5* 7  --   CREATININE 1.09*   < > 0.95 0.90 0.92 0.88 0.82  --   CALCIUM 8.2*   < > 8.0* 8.3* 8.3* 8.2* 8.2*  --   MG 2.1  --   --   --   --   --   --   --    < > =  values in this interval not displayed.   GFR: Estimated Creatinine Clearance: 93.3 mL/min (by C-G formula based on SCr of 0.82 mg/dL). Liver Function Tests: Recent Labs  Lab 02/04/21 0042  AST 12*  ALT 18  ALKPHOS 115  BILITOT 0.7  PROT 5.2*  ALBUMIN 2.1*   No results for input(s): LIPASE, AMYLASE in the last 168 hours. No results for input(s): AMMONIA in the last 168 hours. Coagulation Profile: Recent Labs  Lab 02/04/21 0042 02/05/21 0055 02/06/21 0325 02/07/21 0157 02/08/21 0307  INR 1.1 1.3* 1.3* 1.2 1.2   Cardiac Enzymes: No results for input(s): CKTOTAL, CKMB, CKMBINDEX, TROPONINI in the last 168 hours. BNP (last 3 results) No results for input(s): PROBNP in the last 8760 hours. HbA1C: No results for input(s): HGBA1C in the last 72 hours. CBG: Recent Labs  Lab 02/07/21 1950 02/07/21 2309 02/08/21 0522 02/08/21 0710 02/08/21 1123  GLUCAP 156* 139* 121* 94 126*   Lipid Profile: No results for input(s): CHOL, HDL, LDLCALC, TRIG, CHOLHDL, LDLDIRECT in the last 72 hours. Thyroid Function Tests: No results for input(s): TSH, T4TOTAL, FREET4, T3FREE, THYROIDAB in the last 72 hours. Anemia Panel: No results for input(s): VITAMINB12, FOLATE, FERRITIN, TIBC, IRON, RETICCTPCT in the last 72 hours. Sepsis Labs: No results for input(s): PROCALCITON, LATICACIDVEN in the last 168 hours.  Recent Results (from the past 240 hour(s))  Resp Panel by RT-PCR (Flu A&B, Covid) Nasopharyngeal Swab     Status: None    Collection Time: 01/31/21  7:47 PM   Specimen: Nasopharyngeal Swab; Nasopharyngeal(NP) swabs in vial transport medium  Result Value Ref Range Status   SARS Coronavirus 2 by RT PCR NEGATIVE NEGATIVE Final    Comment: (NOTE) SARS-CoV-2 target nucleic acids are NOT DETECTED.  The SARS-CoV-2 RNA is generally detectable in upper respiratory specimens during the acute phase of infection. The lowest concentration of SARS-CoV-2 viral copies this assay can detect is 138 copies/mL. A negative result does not preclude SARS-Cov-2 infection and should not be used as the sole basis for treatment or other patient management decisions. A negative result may occur with  improper specimen collection/handling, submission of specimen other than nasopharyngeal swab, presence of viral mutation(s) within the areas targeted by this assay, and inadequate number of viral copies(<138 copies/mL). A negative result must be combined with clinical observations, patient history, and epidemiological information. The expected result is Negative.  Fact Sheet for Patients:  EntrepreneurPulse.com.au  Fact Sheet for Healthcare Providers:  IncredibleEmployment.be  This test is no t yet approved or cleared by the [place] FDA and  has been authorized for detection and/or diagnosis of SARS-CoV-2 by FDA under an Emergency Use Authorization (EUA). This EUA will remain  in effect (meaning this test can be used) for the duration of the COVID-19 declaration under Section 564(b)(1) of the Act, 21 U.S.C.section 360bbb-3(b)(1), unless the authorization is terminated  or revoked sooner.       Influenza A by PCR NEGATIVE NEGATIVE Final   Influenza B by PCR NEGATIVE NEGATIVE Final    Comment: (NOTE) The Xpert Xpress SARS-CoV-2/FLU/RSV plus assay is intended as an aid in the diagnosis of influenza from Nasopharyngeal swab specimens and should not be used as a sole basis for treatment.  Nasal washings and aspirates are unacceptable for Xpert Xpress SARS-CoV-2/FLU/RSV testing.  Fact Sheet for Patients: EntrepreneurPulse.com.au  Fact Sheet for Healthcare Providers: IncredibleEmployment.be  This test is not yet approved or cleared by the [place] FDA and has been authorized for detection and/or diagnosis  of SARS-CoV-2 by FDA under an Emergency Use Authorization (EUA). This EUA will remain in effect (meaning this test can be used) for the duration of the COVID-19 declaration under Section 564(b)(1) of the Act, 21 U.S.C. section 360bbb-3(b)(1), unless the authorization is terminated or revoked.  Performed at [facility], [address] [place], [place] [address]   MRSA Next Gen by PCR, Nasal     Status: None   Collection Time: 02/01/21 12:26 AM   Specimen: Nasal Mucosa; Nasal Swab  Result Value Ref Range Status   MRSA by PCR Next Gen NOT DETECTED NOT DETECTED Final    Comment: (NOTE) The GeneXpert MRSA Assay (FDA approved for NASAL specimens only), is one component of a comprehensive MRSA colonization surveillance program. It is not intended to diagnose MRSA infection nor to guide or monitor treatment for MRSA infections. Test performance is not FDA approved in patients less than 74 years old. Performed at [facility], [place] [address] [place], [place] [address]   Culture, blood (routine x 2)     Status: None   Collection Time: 02/01/21  2:10 AM   Specimen: BLOOD  Result Value Ref Range Status   Specimen Description BLOOD LEFT ANTECUBITAL  Final   Special Requests   Final    BOTTLES DRAWN AEROBIC AND ANAEROBIC Blood Culture results may not be optimal due to an inadequate volume of blood received in culture bottles   Culture   Final    NO GROWTH 5 DAYS Performed at [facility], [place] [address], [place], [place] [address]    Report Status 02/06/2021 FINAL  Final  Culture, blood (routine  x 2)     Status: None   Collection Time: 02/01/21  2:21 AM   Specimen: BLOOD LEFT HAND  Result Value Ref Range Status   Specimen Description BLOOD LEFT HAND  Final   Special Requests   Final    BOTTLES DRAWN AEROBIC AND ANAEROBIC Blood Culture adequate volume   Culture   Final    NO GROWTH 5 DAYS Performed at [facility], [place] [address] [place],  [address]    Report Status 02/06/2021 FINAL  Final         Radiology Studies: CT ANGIO ABDOMEN W &/OR WO CONTRAST  Result Date: 02/07/2021 CLINICAL DATA:  Possible pseudoaneurysm on recent endoscopic ultrasound EXAM: CT ANGIOGRAPHY ABDOMEN TECHNIQUE: Multidetector CT imaging of the abdomen was performed using the standard protocol during bolus administration of intravenous contrast. Multiplanar reconstructed images and MIPs were obtained and reviewed to evaluate the vascular anatomy. CONTRAST:  142mL OMNIPAQUE IOHEXOL 350 MG/ML SOLN COMPARISON:  01/31/2021 FINDINGS: VASCULAR Aorta: Normal caliber aorta without aneurysm, dissection, vasculitis or significant stenosis. Celiac: Patent without evidence of aneurysm, dissection, vasculitis or significant stenosis. Splenic artery specifically is well visualized and tortuous in nature. It extends to the splenic hilum in a normal fashion and although coursing adjacent to the pancreatic pseudocyst this is felt to not represent the vascular structure seen on recent endoscopic ultrasound. No pseudoaneurysm is identified. SMA: Patent without evidence of aneurysm, dissection, vasculitis or significant stenosis. Renals: Both renal arteries are patent without evidence of aneurysm, dissection, vasculitis, fibromuscular dysplasia or significant stenosis. IMA: Patent without evidence of aneurysm, dissection, vasculitis or significant stenosis. Veins: In the interval from the prior examination there is been progressive thrombosis of the left portal vein with differential enhancement in the left lobe of the  liver. No normal left portal venous branches are  seen. There is thrombus within the main portal vein which appears more prominent than that seen on the prior exam as well as thrombus within peripheral right portal venous branches best seen on image numbers 62 and 81 of series 8. The splenic vein is prominent likely related to the more central portal venous thrombus and courses along the posterior aspect of the pseudocyst and is felt to correspond to that seen on the recent endoscopic ultrasound. The remainder of the venous structures appear within normal limits. Review of the MIP images confirms the above findings. NON-VASCULAR Lower chest: Small bilateral pleural effusions are noted. Left greater than right consolidation in the lower lobes is seen. No parenchymal nodules are identified. Hepatobiliary: Heterogeneous enhancement is noted within the liver related to the progressive thrombosis of the portal venous system as described above. Generalized fatty infiltration of the liver is noted. No discrete mass is seen. Gallbladder has been surgically removed. Pancreas: Pancreas is similar to that seen on the prior exam with evidence of diffuse pancreatic necrosis only minimal residual normal enhancing pancreatic tissue is seen. Large peripancreatic fluid collection is noted along the central portion of the pancreatic head and extending along the course of the body and tail of the pancreas similar to that seen on the prior exam. It measures approximately 17 x 6.3 cm in transverse and AP dimensions. It extends approximately 8 cm in length in the craniocaudad projection. The splenic artery and splenic vein course along the posterior aspect of the pseudocyst as previously described although no evidence of extravasation of contrast into the pseudocyst is noted to suggest hemorrhage and no pseudoaneurysm is seen. The pseudocyst extends in a fashion surrounding the splenic artery in some locations although no large vascular  structure is noted interposed between the body of the stomach and the central large portion of the pseudocyst. Spleen: Normal in size without focal abnormality. Adrenals/Urinary Tract: Adrenal glands are within normal limits. Kidneys demonstrate a normal enhancement pattern bilaterally. No renal calculi are noted. Normal excretion of contrast is noted bilaterally. Ureters are within normal limits without obstructive change. Bladder is not included on this exam. Stomach/Bowel: Stomach is predominately decompressed lying anterior to the pancreatic pseudocyst as previously described. Again no large intervening vascular structure is noted between the body of the stomach in the main portion of the pseudocyst. These are predominately noted posterior to the pseudocyst with the exception of 1 area in the region of the splenic hilum best noted on image number 159 of series 7. The visualized colon and small bowel appear within normal limits. Lymphatic: No sizable adenopathy is identified to correspond with that seen on recent endoscopic ultrasound. Other: No significant ascites is noted. Musculoskeletal: Degenerative changes of the thoracolumbar spine are seen. IMPRESSION: VASCULAR No arterial abnormality is noted. Specifically no splenic artery pseudoaneurysm is seen. The splenic vein is prominent due to the central portal venous thrombus and courses along the posterior aspect of the pseudocyst primarily. In the region of the splenic hilum, the splenic artery and vein are noted adjacent to the most superior posterior aspect of the pseudocyst and likely correspond to that seen on recent ultrasound. Progressive thrombosis involving the main portal vein and its branches within the liver particularly on the left. NON-VASCULAR Large pancreatic pseudocyst remains similar to that seen on the prior exam. Small bilateral pleural effusions with bibasilar atelectatic changes. Differential enhancement in the liver related to progressive  portal venous thrombosis when compared with the prior exam. Electronically Signed  By: Inez Catalina M.D.   On: 02/07/2021 20:16        Scheduled Meds:  Chlorhexidine Gluconate Cloth  6 each Topical Daily   enoxaparin (LOVENOX) injection  90 mg Subcutaneous Q12H   escitalopram  10 mg Oral Daily   feeding supplement  237 mL Oral BID BM   insulin aspart  1-3 Units Subcutaneous Q4H   pantoprazole (PROTONIX) IV  40 mg Intravenous Q12H   Continuous Infusions:  lactated ringers 50 mL/hr at 02/07/21 1215   meropenem (MERREM) IV 1 g (02/08/21 0938)   promethazine (PHENERGAN) injection (IM or IVPB)       LOS: 7 days    Time spent: 25 mins,More than 50% of that time was spent in counseling and/or coordination of care.      Shelly Coss, MD [facility] P7/04/2021, 11:27 AM

## 2021-02-09 ENCOUNTER — Encounter (HOSPITAL_COMMUNITY): Payer: Self-pay | Admitting: Gastroenterology

## 2021-02-09 DIAGNOSIS — K858 Other acute pancreatitis without necrosis or infection: Secondary | ICD-10-CM

## 2021-02-09 LAB — PROTIME-INR
INR: 1.3 — ABNORMAL HIGH (ref 0.8–1.2)
Prothrombin Time: 15.9 seconds — ABNORMAL HIGH (ref 11.4–15.2)

## 2021-02-09 LAB — GLUCOSE, CAPILLARY
Glucose-Capillary: 106 mg/dL — ABNORMAL HIGH (ref 70–99)
Glucose-Capillary: 116 mg/dL — ABNORMAL HIGH (ref 70–99)
Glucose-Capillary: 105 mg/dL — ABNORMAL HIGH (ref 70–99)
Glucose-Capillary: 107 mg/dL — ABNORMAL HIGH (ref 70–99)
Glucose-Capillary: 145 mg/dL — ABNORMAL HIGH (ref 70–99)

## 2021-02-09 LAB — CBC
HCT: 29.5 % — ABNORMAL LOW (ref 36.0–46.0)
Hemoglobin: 9.5 g/dL — ABNORMAL LOW (ref 12.0–15.0)
MCH: 28.6 pg (ref 26.0–34.0)
MCHC: 32.2 g/dL (ref 30.0–36.0)
MCV: 88.9 fL (ref 80.0–100.0)
Platelets: 372 10*3/uL (ref 150–400)
RBC: 3.32 MIL/uL — ABNORMAL LOW (ref 3.87–5.11)
RDW: 13.2 % (ref 11.5–15.5)
WBC: 7.3 10*3/uL (ref 4.0–10.5)
nRBC: 0 % (ref 0.0–0.2)

## 2021-02-09 LAB — BASIC METABOLIC PANEL
Anion gap: 6 (ref 5–15)
BUN: 6 mg/dL (ref 6–20)
CO2: 34 mmol/L — ABNORMAL HIGH (ref 22–32)
Calcium: 8.4 mg/dL — ABNORMAL LOW (ref 8.9–10.3)
Chloride: 100 mmol/L (ref 98–111)
Creatinine, Ser: 0.88 mg/dL (ref 0.44–1.00)
GFR, Estimated: >60 mL/min (ref >60)
Glucose, Bld: 111 mg/dL — ABNORMAL HIGH (ref 70–99)
Potassium: 3.6 mmol/L (ref 3.5–5.1)
Sodium: 140 mmol/L (ref 135–145)

## 2021-02-09 NOTE — Progress Notes (Signed)
[facility] Gastroenterology Progress Note  Kimberly Kelly 50 y.o. Dec 16, 1970  CC: Complicated pancreatitis with large pseudocyst   Subjective: Patient seen and examined at bedside.  Was ambulating in the hallway.  Continues to have substernal and epigastric pressure feeling and nausea.  Denies any vomiting.  Afebrile.  ROS : Febrile, negative for chest pain   Objective: Vital signs in last 24 hours: Vitals:   02/08/21 2258 02/09/21 0508  BP: 121/83 126/82  Pulse: 94 81  Resp: 14 15  Temp: 98 F (36.7 C) 98 F (36.7 C)  SpO2: 94% 96%    Physical Exam:  General:  Alert, cooperative, no distress, appears stated age  Head:  Normocephalic, without obvious abnormality, atraumatic  Eyes:  , EOM's intact,   Lungs:   Clear to auscultation bilaterally, respirations unlabored  Heart:  Regular rate and rhythm, S1, S2 normal  Abdomen:   Soft, upper quadrant discomfort on palpation without any significant tenderness, bowel sounds active all four quadrants,  no masses,   Extremities: Extremities normal, atraumatic, no  edema  Pulses: 2+ and symmetric    Lab Results: Recent Labs    02/08/21 0051 02/08/21 0758 02/09/21 0203  NA 140  --  140  K 5.9* 3.7 3.6  CL 103  --  100  CO2 29  --  34*  GLUCOSE 126*  --  111*  BUN 7  --  6  CREATININE 0.82  --  0.88  CALCIUM 8.2*  --  8.4*   No results for input(s): AST, ALT, ALKPHOS, BILITOT, PROT, ALBUMIN in the last 72 hours. Recent Labs    02/08/21 0051 02/09/21 0203  WBC 6.0 7.3  HGB 9.2* 9.5*  HCT 28.5* 29.5*  MCV 88.0 88.9  PLT 451* 372   Recent Labs    02/08/21 0307 02/09/21 0203  LABPROT 15.0 15.9*  INR 1.2 1.3*      Assessment/Plan: -Complicated pancreatitis with pancreatic necrosis and  large pseudocyst.CTA negative for  arterial abnormality or pseudoaneurysm -Portal vein thrombosis.  On Lovenox  Recommendations ------------------------ -Awaiting Dr. Donneta Romberg recommendations regarding repeat  cystogastrostomy -Continue Lovenox for now. -Regular diet -GI will follow   Otis Brace MD, [place] 02/09/2021, 9:37 AM  Contact #  [phone]

## 2021-02-09 NOTE — Progress Notes (Signed)
PROGRESS NOTE    Kimberly Kelly  IRS:[id number] DOB: 29-Jun-1971 DOA: 01/31/2021 PCP: [name], FNP   Chief Complain:Abd pain  Brief Narrative:  Patient is a 50 yo woman with a hx of anxiety/depression, hx of lung nodules, recent pancreatitis, here with n/v, generalized abdominal pain, fever. Acute intractable nausea a vomiting 24h prior to admission. Took tylenol and zofran with no improvement. Recently hospitalized from 6/7-6/22 at [facility] with post ERCP pancreatitis. Marland KitchenShe was transferred to [facility] for pancreatitis with concern for pancreatic necrosis and large pancreatic pseudocyst for interventional GI follow-up and work-up.  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 - Continue meropenem - Blood cultures remain negative - Pain control with morphine IV & oxycodone PRN. Recommend zofran with oxycodone - GI following.she underwent upper endoscopic ultrasound on 02/07/2021  -plan for repeat cystogastrostomy next week   coffee ground emesis in the setting of GERD without history of GI bleed, resolved Acute blood loss anemia, stabilizing On EGD 02/01/21: moderately severe esophagitis, one superficial esophageal ulcer. Diffuse mild inflammation due to vascular congestion in stomach. Extrinsic compression of stomach by psudeocyst. -Continue PPI twice daily, on low fat diet  -Recommended to eat small portion of meals -Hb stable   Hyperglycemia, concern for endocrine pancreatic dysfunction -No known history of diabetes or hyper/hypoglycemic  episodes  -SSI PRN -goal BG 140-180 if requiring insulin -High risk for pancreatic destruction and subsequent diabetes, will need close outpatient follow-up; consider glucose monitor at discharge    UTI: asymptomatic. -Covered by meropenem as above   Portal Vein thrombosis -Transitioned from heparin drip to Lovenox given IV issues and labile heparin levels -likely transition to DOAC at discharge   Incidentally noted pulmonary nodules -OP follow up as previously scheduled   Anxiety/depression -Continue home lexapro  Bilateral lower extremity swelling Most likely secondary to IV fluids that she received during the hospitalization.  I have recommended her to  keep her legs elevated while in bed.  IV fluids discontinued           DVT prophylaxis:Lovenox Code Status: None Family Communication: None at the bedside  status is: Inpatient Dispo: The patient is from: Home              Anticipated d/c is to: Home              Patient currently is not medically stable to d/c.   Difficult to place patient No     Consultants: GI  Procedures:Endoscopic [place]  Antimicrobials:  Anti-infectives (From admission, onward)    Start     Dose/Rate Route Frequency Ordered Stop   02/01/21 1030  meropenem (MERREM) 1 g in sodium chloride 0.9 % 100 mL IVPB        1 g 200 mL/hr over 30 Minutes Intravenous Every 8 hours 02/01/21 0938     02/01/21 1000  ceFEPIme (MAXIPIME) 2 g in sodium chloride 0.9 % 100 mL IVPB  Status:  Discontinued        2 g 200 mL/hr over 30 Minutes Intravenous Every 12 hours 02/01/21 0203 02/01/21 0937   02/01/21  0600  metroNIDAZOLE (FLAGYL) IVPB 500 mg  Status:  Discontinued        500 mg 100 mL/hr over 60 Minutes Intravenous Every 8 hours 02/01/21 0200 02/01/21 0912   01/31/21 2030  ceFEPIme (MAXIPIME) 2,000 mg in sodium chloride 0.9 % 100 mL IVPB        2,000 mg 200 mL/hr over 30 Minutes Intravenous  Once 01/31/21 2025 01/31/21 2218   01/31/21 2030  metroNIDAZOLE  (FLAGYL) IVPB 500 mg        500 mg 100 mL/hr over 60 Minutes Intravenous  Once 01/31/21 2025 01/31/21 2217   01/31/21 1645  cefTRIAXone (ROCEPHIN) 1 g in sodium chloride 0.9 % 100 mL IVPB        1 g 200 mL/hr over 30 Minutes Intravenous  Once 01/31/21 1639 01/31/21 1736       Subjective:  Patient seen and examined at the bedside this morning.  Hemodynamically stable comfortable.  She was ambulating on the hallways. She denies any worsening abdominal pain, nausea or vomiting.  Frustrated about the uncertainty of the date of the procedure  Objective: Vitals:   02/07/21 2119 02/08/21 1348 02/08/21 2258 02/09/21 0508  BP: 127/83 126/76 121/83 126/82  Pulse: 87 100 94 81  Resp: 15 17 14 15   Temp: 97.8 F (36.6 C) 98.1 F (36.7 C) 98 F (36.7 C) 98 F (36.7 C)  TempSrc: Oral Oral Oral Oral  SpO2: 95% 97% 94% 96%  Weight:      Height:       No intake or output data in the 24 hours ending 02/09/21 1114  Filed Weights   02/06/21 0500 02/07/21 0335 02/07/21 1213  Weight: 88.6 kg 87.5 kg 87.5 kg    Examination:  General exam: Overall comfortable, not in distress HEENT: PERRL Respiratory system:  no wheezes or crackles  Cardiovascular system: S1 & S2 heard, RRR.  Gastrointestinal system: Abdomen is nondistended, soft and has mild generalized tenderness. Central nervous system: Alert and oriented Extremities: No edema, no clubbing ,no cyanosis Skin: No rashes, no ulcers,no icterus     Data Reviewed: I have personally reviewed following labs and imaging studies  CBC: Recent Labs  Lab 02/05/21 0055 02/06/21 0325 02/07/21 0157 02/08/21 0051 02/09/21 0203  WBC 4.5 10.0 7.0 6.0 7.3  HGB 8.5* 8.6* 8.4* 9.2* 9.5*  HCT 26.3* 27.0* 26.0* 28.5* 29.5*  MCV 87.4 87.9 87.8 88.0 88.9  PLT 270 322 322 451* 601   Basic Metabolic Panel: Recent Labs  Lab 02/05/21 0055 02/06/21 0325 02/07/21 0157 02/08/21 0051 02/08/21 0758 02/09/21 0203  NA 139 139 139 140  --  140  K 3.6  3.7 3.4* 5.9* 3.7 3.6  CL 104 103 101 103  --  100  CO2 29 30 35* 29  --  34*  GLUCOSE 126* 127* 131* 126*  --  111*  BUN <5* <5* <5* 7  --  6  CREATININE 0.90 0.92 0.88 0.82  --  0.88  CALCIUM 8.3* 8.3* 8.2* 8.2*  --  8.4*   GFR: Estimated Creatinine Clearance: 86.9 mL/min (by C-G formula based on SCr of 0.88 mg/dL). Liver Function Tests: Recent Labs  Lab 02/04/21 0042  AST 12*  ALT 18  ALKPHOS 115  BILITOT 0.7  PROT 5.2*  ALBUMIN 2.1*   No results for input(s): LIPASE, AMYLASE in the last 168 hours. No results for input(s): AMMONIA in the last 168 hours. Coagulation Profile: Recent Labs  Lab 02/05/21 0055 02/06/21 0325 02/07/21 0157  02/08/21 0307 02/09/21 0203  INR 1.3* 1.3* 1.2 1.2 1.3*   Cardiac Enzymes: No results for input(s): CKTOTAL, CKMB, CKMBINDEX, TROPONINI in the last 168 hours. BNP (last 3 results) No results for input(s): PROBNP in the last 8760 hours. HbA1C: No results for input(s): HGBA1C in the last 72 hours. CBG: Recent Labs  Lab 02/08/21 1521 02/08/21 2042 02/08/21 2354 02/09/21 0428 02/09/21 0733  GLUCAP 148* 122* 108* 106* 116*   Lipid Profile: No results for input(s): CHOL, HDL, LDLCALC, TRIG, CHOLHDL, LDLDIRECT in the last 72 hours. Thyroid Function Tests: No results for input(s): TSH, T4TOTAL, FREET4, T3FREE, THYROIDAB in the last 72 hours. Anemia Panel: No results for input(s): VITAMINB12, FOLATE, FERRITIN, TIBC, IRON, RETICCTPCT in the last 72 hours. Sepsis Labs: No results for input(s): PROCALCITON, LATICACIDVEN in the last 168 hours.  Recent Results (from the past 240 hour(s))  Resp Panel by RT-PCR (Flu A&B, Covid) Nasopharyngeal Swab     Status: None   Collection Time: 01/31/21  7:47 PM   Specimen: Nasopharyngeal Swab; Nasopharyngeal(NP) swabs in vial transport medium  Result Value Ref Range Status   SARS Coronavirus 2 by RT PCR NEGATIVE NEGATIVE Final    Comment: (NOTE) SARS-CoV-2 target nucleic acids are NOT  DETECTED.  The SARS-CoV-2 RNA is generally detectable in upper respiratory specimens during the acute phase of infection. The lowest concentration of SARS-CoV-2 viral copies this assay can detect is 138 copies/mL. A negative result does not preclude SARS-Cov-2 infection and should not be used as the sole basis for treatment or other patient management decisions. A negative result may occur with  improper specimen collection/handling, submission of specimen other than nasopharyngeal swab, presence of viral mutation(s) within the areas targeted by this assay, and inadequate number of viral copies(<138 copies/mL). A negative result must be combined with clinical observations, patient history, and epidemiological information. The expected result is Negative.  Fact Sheet for Patients:  EntrepreneurPulse.com.au  Fact Sheet for Healthcare Providers:  IncredibleEmployment.be  This test is no t yet approved or cleared by the [place] FDA and  has been authorized for detection and/or diagnosis of SARS-CoV-2 by FDA under an Emergency Use Authorization (EUA). This EUA will remain  in effect (meaning this test can be used) for the duration of the COVID-19 declaration under Section 564(b)(1) of the Act, 21 U.S.C.section 360bbb-3(b)(1), unless the authorization is terminated  or revoked sooner.       Influenza A by PCR NEGATIVE NEGATIVE Final   Influenza B by PCR NEGATIVE NEGATIVE Final    Comment: (NOTE) The Xpert Xpress SARS-CoV-2/FLU/RSV plus assay is intended as an aid in the diagnosis of influenza from Nasopharyngeal swab specimens and should not be used as a sole basis for treatment. Nasal washings and aspirates are unacceptable for Xpert Xpress SARS-CoV-2/FLU/RSV testing.  Fact Sheet for Patients: EntrepreneurPulse.com.au  Fact Sheet for Healthcare Providers: IncredibleEmployment.be  This test is not yet  approved or cleared by the [place] FDA and has been authorized for detection and/or diagnosis of SARS-CoV-2 by FDA under an Emergency Use Authorization (EUA). This EUA will remain in effect (meaning this test can be used) for the duration of the COVID-19 declaration under Section 564(b)(1) of the Act, 21 U.S.C. section 360bbb-3(b)(1), unless the authorization is terminated or revoked.  Performed at [facility], [address] [place], [place] [address]   MRSA Next Gen by PCR, Nasal     Status: None   Collection Time: 02/01/21 12:26 AM   Specimen: Nasal Mucosa;  Nasal Swab  Result Value Ref Range Status   MRSA by PCR Next Gen NOT DETECTED NOT DETECTED Final    Comment: (NOTE) The GeneXpert MRSA Assay (FDA approved for NASAL specimens only), is one component of a comprehensive MRSA colonization surveillance program. It is not intended to diagnose MRSA infection nor to guide or monitor treatment for MRSA infections. Test performance is not FDA approved in patients less than 55 years old. Performed at [facility], [place] [address] [place], [place] [address]   Culture, blood (routine x 2)     Status: None   Collection Time: 02/01/21  2:10 AM   Specimen: BLOOD  Result Value Ref Range Status   Specimen Description BLOOD LEFT ANTECUBITAL  Final   Special Requests   Final    BOTTLES DRAWN AEROBIC AND ANAEROBIC Blood Culture results may not be optimal due to an inadequate volume of blood received in culture bottles   Culture   Final    NO GROWTH 5 DAYS Performed at [facility], [place] [address] [place], [place] [address]    Report Status 02/06/2021 FINAL  Final  Culture, blood (routine x 2)     Status: None   Collection Time: 02/01/21  2:21 AM   Specimen: BLOOD LEFT HAND  Result Value Ref Range Status   Specimen Description BLOOD LEFT HAND  Final   Special Requests   Final    BOTTLES DRAWN AEROBIC AND ANAEROBIC Blood Culture adequate volume    Culture   Final    NO GROWTH 5 DAYS Performed at [facility], [place] [address], [place], [place] [address]    Report Status 02/06/2021 FINAL  Final         Radiology Studies: CT ANGIO ABDOMEN W &/OR WO CONTRAST  Result Date: 02/07/2021 CLINICAL DATA:  Possible pseudoaneurysm on recent endoscopic ultrasound EXAM: CT ANGIOGRAPHY ABDOMEN TECHNIQUE: Multidetector CT imaging of the abdomen was performed using the standard protocol during bolus administration of intravenous contrast. Multiplanar reconstructed images and MIPs were obtained and reviewed to evaluate the vascular anatomy. CONTRAST:  188mL OMNIPAQUE IOHEXOL 350 MG/ML SOLN COMPARISON:  01/31/2021 FINDINGS: VASCULAR Aorta: Normal caliber aorta without aneurysm, dissection, vasculitis or significant stenosis. Celiac: Patent without evidence of aneurysm, dissection, vasculitis or significant stenosis. Splenic artery specifically is well visualized and tortuous in nature. It extends to the splenic hilum in a normal fashion and although coursing adjacent to the pancreatic pseudocyst this is felt to not represent the vascular structure seen on recent endoscopic ultrasound. No pseudoaneurysm is identified. SMA: Patent without evidence of aneurysm, dissection, vasculitis or significant stenosis. Renals: Both renal arteries are patent without evidence of aneurysm, dissection, vasculitis, fibromuscular dysplasia or significant stenosis. IMA: Patent without evidence of aneurysm, dissection, vasculitis or significant stenosis. Veins: In the interval from the prior examination there is been progressive thrombosis of the left portal vein with differential enhancement in the left lobe of the liver. No normal left portal venous branches are seen. There is thrombus within the main portal vein which appears more prominent than that seen on the prior exam as well as thrombus within peripheral right portal venous branches best seen on image numbers 62 and 81 of  series 8. The splenic vein is prominent likely related to the more central portal venous thrombus and courses along the posterior aspect of the pseudocyst and is felt to correspond to that seen on the recent endoscopic ultrasound. The remainder of the venous structures appear within  normal limits. Review of the MIP images confirms the above findings. NON-VASCULAR Lower chest: Small bilateral pleural effusions are noted. Left greater than right consolidation in the lower lobes is seen. No parenchymal nodules are identified. Hepatobiliary: Heterogeneous enhancement is noted within the liver related to the progressive thrombosis of the portal venous system as described above. Generalized fatty infiltration of the liver is noted. No discrete mass is seen. Gallbladder has been surgically removed. Pancreas: Pancreas is similar to that seen on the prior exam with evidence of diffuse pancreatic necrosis only minimal residual normal enhancing pancreatic tissue is seen. Large peripancreatic fluid collection is noted along the central portion of the pancreatic head and extending along the course of the body and tail of the pancreas similar to that seen on the prior exam. It measures approximately 17 x 6.3 cm in transverse and AP dimensions. It extends approximately 8 cm in length in the craniocaudad projection. The splenic artery and splenic vein course along the posterior aspect of the pseudocyst as previously described although no evidence of extravasation of contrast into the pseudocyst is noted to suggest hemorrhage and no pseudoaneurysm is seen. The pseudocyst extends in a fashion surrounding the splenic artery in some locations although no large vascular structure is noted interposed between the body of the stomach and the central large portion of the pseudocyst. Spleen: Normal in size without focal abnormality. Adrenals/Urinary Tract: Adrenal glands are within normal limits. Kidneys demonstrate a normal enhancement  pattern bilaterally. No renal calculi are noted. Normal excretion of contrast is noted bilaterally. Ureters are within normal limits without obstructive change. Bladder is not included on this exam. Stomach/Bowel: Stomach is predominately decompressed lying anterior to the pancreatic pseudocyst as previously described. Again no large intervening vascular structure is noted between the body of the stomach in the main portion of the pseudocyst. These are predominately noted posterior to the pseudocyst with the exception of 1 area in the region of the splenic hilum best noted on image number 159 of series 7. The visualized colon and small bowel appear within normal limits. Lymphatic: No sizable adenopathy is identified to correspond with that seen on recent endoscopic ultrasound. Other: No significant ascites is noted. Musculoskeletal: Degenerative changes of the thoracolumbar spine are seen. IMPRESSION: VASCULAR No arterial abnormality is noted. Specifically no splenic artery pseudoaneurysm is seen. The splenic vein is prominent due to the central portal venous thrombus and courses along the posterior aspect of the pseudocyst primarily. In the region of the splenic hilum, the splenic artery and vein are noted adjacent to the most superior posterior aspect of the pseudocyst and likely correspond to that seen on recent ultrasound. Progressive thrombosis involving the main portal vein and its branches within the liver particularly on the left. NON-VASCULAR Large pancreatic pseudocyst remains similar to that seen on the prior exam. Small bilateral pleural effusions with bibasilar atelectatic changes. Differential enhancement in the liver related to progressive portal venous thrombosis when compared with the prior exam. Electronically Signed   By: Inez Catalina M.D.   On: 02/07/2021 20:16        Scheduled Meds:  Chlorhexidine Gluconate Cloth  6 each Topical Daily   enoxaparin (LOVENOX) injection  90 mg Subcutaneous  Q12H   escitalopram  10 mg Oral Daily   feeding supplement  237 mL Oral BID BM   insulin aspart  1-3 Units Subcutaneous Q4H   pantoprazole (PROTONIX) IV  40 mg Intravenous Q12H   Continuous Infusions:  lactated ringers 50 mL/hr at  02/08/21 1338   meropenem (MERREM) IV 1 g (02/09/21 0925)   promethazine (PHENERGAN) injection (IM or IVPB)       LOS: 8 days    Time spent: 25 mins,More than 50% of that time was spent in counseling and/or coordination of care.      Shelly Coss, MD [facility] P7/05/2021, 11:14 AM

## 2021-02-10 ENCOUNTER — Encounter: Payer: Self-pay | Admitting: Gastroenterology

## 2021-02-10 LAB — CBC
HCT: 26.9 % — ABNORMAL LOW (ref 36.0–46.0)
Hemoglobin: 8.6 g/dL — ABNORMAL LOW (ref 12.0–15.0)
MCH: 28.1 pg (ref 26.0–34.0)
MCHC: 32 g/dL (ref 30.0–36.0)
MCV: 87.9 fL (ref 80.0–100.0)
Platelets: 311 10*3/uL (ref 150–400)
RBC: 3.06 MIL/uL — ABNORMAL LOW (ref 3.87–5.11)
RDW: 13.5 % (ref 11.5–15.5)
WBC: 5.7 10*3/uL (ref 4.0–10.5)
nRBC: 0 % (ref 0.0–0.2)

## 2021-02-10 LAB — PROTIME-INR
INR: 1.2 (ref 0.8–1.2)
Prothrombin Time: 15.4 seconds — ABNORMAL HIGH (ref 11.4–15.2)

## 2021-02-10 LAB — GLUCOSE, CAPILLARY
Glucose-Capillary: 112 mg/dL — ABNORMAL HIGH (ref 70–99)
Glucose-Capillary: 117 mg/dL — ABNORMAL HIGH (ref 70–99)
Glucose-Capillary: 118 mg/dL — ABNORMAL HIGH (ref 70–99)
Glucose-Capillary: 132 mg/dL — ABNORMAL HIGH (ref 70–99)
Glucose-Capillary: 138 mg/dL — ABNORMAL HIGH (ref 70–99)
Glucose-Capillary: 93 mg/dL (ref 70–99)

## 2021-02-10 LAB — BASIC METABOLIC PANEL
Anion gap: 8 (ref 5–15)
BUN: 6 mg/dL (ref 6–20)
CO2: 34 mmol/L — ABNORMAL HIGH (ref 22–32)
Calcium: 8.3 mg/dL — ABNORMAL LOW (ref 8.9–10.3)
Chloride: 98 mmol/L (ref 98–111)
Creatinine, Ser: 0.84 mg/dL (ref 0.44–1.00)
GFR, Estimated: >60 mL/min (ref >60)
Glucose, Bld: 109 mg/dL — ABNORMAL HIGH (ref 70–99)
Potassium: 3.4 mmol/L — ABNORMAL LOW (ref 3.5–5.1)
Sodium: 140 mmol/L (ref 135–145)

## 2021-02-10 LAB — SURGICAL PATHOLOGY

## 2021-02-10 MED ORDER — DOCUSATE SODIUM 100 MG PO CAPS: 100.0000 mg | ORAL_CAPSULE | Freq: Two times a day (BID) | ORAL | 0 refills | Status: DC | PRN

## 2021-02-10 MED ORDER — SODIUM CHLORIDE 0.9 % IV SOLN: 1.0000 g | Freq: Three times a day (TID) | INTRAVENOUS | Status: DC

## 2021-02-10 MED ORDER — ONDANSETRON HCL 4 MG/2ML IJ SOLN: 4.0000 mg | Freq: Four times a day (QID) | INTRAMUSCULAR | 0 refills | Status: DC | PRN

## 2021-02-10 MED ORDER — PANTOPRAZOLE SODIUM 40 MG IV SOLR: 40.0000 mg | Freq: Two times a day (BID) | INTRAVENOUS | Status: DC

## 2021-02-10 MED ORDER — POLYETHYLENE GLYCOL 3350 17 G PO PACK: 17.0000 g | PACK | Freq: Every day | ORAL | 0 refills | Status: DC | PRN

## 2021-02-10 MED ORDER — SODIUM CHLORIDE 0.9 % IV SOLN: 12.5000 mg | Freq: Four times a day (QID) | INTRAVENOUS | Status: DC | PRN

## 2021-02-10 MED ORDER — SIMETHICONE 80 MG PO CHEW
80.0000 mg | CHEWABLE_TABLET | Freq: Four times a day (QID) | ORAL | 0 refills | Status: DC | PRN
Start: 2021-02-10 — End: 2022-06-03

## 2021-02-10 MED ORDER — ENOXAPARIN SODIUM 100 MG/ML IJ SOSY: 90.0000 mg | PREFILLED_SYRINGE | Freq: Two times a day (BID) | INTRAMUSCULAR | Status: DC

## 2021-02-10 MED ORDER — POTASSIUM CHLORIDE CRYS ER 20 MEQ PO TBCR
40.0000 meq | EXTENDED_RELEASE_TABLET | Freq: Once | ORAL | Status: AC
  Administered 2021-02-10: 40 meq via ORAL
  Filled 2021-02-10: qty 2

## 2021-02-10 MED ORDER — ENSURE ENLIVE PO LIQD: 237.0000 mL | Freq: Two times a day (BID) | ORAL | 12 refills | Status: DC

## 2021-02-10 MED ORDER — OXYCODONE HCL 5 MG PO TABS: 5.0000 mg | ORAL_TABLET | ORAL | 0 refills | Status: DC | PRN

## 2021-02-10 MED ORDER — MORPHINE SULFATE (PF) 2 MG/ML IV SOLN: 1.0000 mg | INTRAVENOUS | 0 refills | Status: DC | PRN

## 2021-02-10 NOTE — Progress Notes (Signed)
[facility] phone # [phone]

## 2021-02-10 NOTE — Progress Notes (Signed)
PROGRESS NOTE    Kimberly Kelly  QBH:[id number] DOB: 03-13-1971 DOA: 01/31/2021 PCP: [name], FNP   Chief Complain:Abd pain  Brief Narrative:  Patient is a 50 yo woman with a hx of anxiety/depression, hx of lung nodules, recent pancreatitis, here with n/v, generalized abdominal pain, fever. Acute intractable nausea a vomiting 24h prior to admission. Took tylenol and zofran with no improvement. Recently hospitalized from 6/7-6/22 at [facility] with post ERCP pancreatitis. Marland KitchenShe was transferred to [facility] for [facility] with concern for pancreatic necrosis and large pancreatic pseudocyst for interventional GI follow-up and work-up.  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   Non-occlusive thrombus in distal main and proximal left portal veins Sepsis secondary to necrotic pancreatitis, presumed infectious process, POA - Previous pancreatitis care has been at [facility], there were unable to offer a bed, thus she has transferred to our system for ongoing treatment. - Continue meropenem - Blood cultures remain negative - Continue pain meds,antiemetics - GI following.she underwent upper endoscopic ultrasound on 02/07/2021  -plan for  cystogastrostomy   coffee ground emesis in the setting of GERD without history of GI bleed, resolved Acute blood loss anemia, stabilizing On EGD 02/01/21: moderately severe esophagitis, one superficial esophageal ulcer. Diffuse mild inflammation due to vascular congestion in stomach. Extrinsic compression of stomach by psudeocyst. -Continue PPI twice daily, on low fat diet  -Recommended to eat small portion of meals -Hb stable in the range of 8   Hyperglycemia, concern for endocrine pancreatic dysfunction -No known history of diabetes or hyper/hypoglycemic episodes  -SSI PRN -goal BG 140-180 if requiring  insulin -High risk for pancreatic destruction and subsequent diabetes, will need close outpatient follow-up; consider glucose monitor at discharge\   Portal Vein thrombosis -Transitioned from heparin drip to Lovenox given IV issues and labile heparin levels -likely transition to DOAC at discharge   Incidentally noted pulmonary nodules -OP follow up as previously scheduled   Anxiety/depression -Continue home lexapro  Bilateral lower extremity swelling Most likely secondary to IV fluids that she received during the hospitalization.  I have recommended her to  keep her legs elevated while in bed.  IV fluids discontinued           DVT prophylaxis:Lovenox Code Status: None Family Communication: None at the bedside  status is: Inpatient Dispo: The patient is from: Home              Anticipated d/c is to: Home              Patient currently is not medically stable to d/c.   Difficult to place patient No     Consultants: GI  Procedures:Endoscopic [place]  Antimicrobials:  Anti-infectives (From admission, onward)    Start     Dose/Rate Route Frequency Ordered Stop   02/01/21 1030  meropenem (MERREM) 1 g in sodium chloride 0.9 % 100 mL IVPB        1 g 200 mL/hr over 30 Minutes Intravenous Every 8 hours 02/01/21 0938     02/01/21 1000  ceFEPIme (MAXIPIME) 2 g in sodium chloride 0.9 % 100 mL IVPB  Status:  Discontinued        2 g 200 mL/hr over 30 Minutes Intravenous Every 12 hours 02/01/21 0203 02/01/21 0937   02/01/21 0600  metroNIDAZOLE (FLAGYL) IVPB 500 mg  Status:  Discontinued  500 mg 100 mL/hr over 60 Minutes Intravenous Every 8 hours 02/01/21 0200 02/01/21 0912   01/31/21 2030  ceFEPIme (MAXIPIME) 2,000 mg in sodium chloride 0.9 % 100 mL IVPB        2,000 mg 200 mL/hr over 30 Minutes Intravenous  Once 01/31/21 2025 01/31/21 2218   01/31/21 2030  metroNIDAZOLE (FLAGYL) IVPB 500 mg        500 mg 100 mL/hr over 60 Minutes Intravenous  Once 01/31/21 2025 01/31/21 2217    01/31/21 1645  cefTRIAXone (ROCEPHIN) 1 g in sodium chloride 0.9 % 100 mL IVPB        1 g 200 mL/hr over 30 Minutes Intravenous  Once 01/31/21 1639 01/31/21 1736       Subjective:  Patient seen and examined the bedside this morning.  Hemodynamically stable.  Complains of nausea today.  Very anxious about the scheduling of planned GI procedure.  Objective: Vitals:   02/08/21 2258 02/09/21 0508 02/09/21 1504 02/10/21 0358  BP: 121/83 126/82 128/88 124/79  Pulse: 94 81 82 81  Resp: 14 15 16 18   Temp: 98 F (36.7 C) 98 F (36.7 C) (!) 97.5 F (36.4 C) 98.4 F (36.9 C)  TempSrc: Oral Oral Oral   SpO2: 94% 96% 97% 95%  Weight:      Height:        Intake/Output Summary (Last 24 hours) at 02/10/2021 0755 Last data filed at 02/09/2021 1715 Gross per 24 hour  Intake 200 ml  Output 200 ml  Net 0 ml    Filed Weights   02/06/21 0500 02/07/21 0335 02/07/21 1213  Weight: 88.6 kg 87.5 kg 87.5 kg    Examination:  General exam: Overall comfortable, not in distress HEENT: PERRL Respiratory system:  no wheezes or crackles  Cardiovascular system: S1 & S2 heard, RRR.  Gastrointestinal system: Abdomen is nondistended, soft and has mild generalized tenderness Central nervous system: Alert and oriented Extremities: No edema, no clubbing ,no cyanosis Skin: No rashes, no ulcers,no icterus       Data Reviewed: I have personally reviewed following labs and imaging studies  CBC: Recent Labs  Lab 02/06/21 0325 02/07/21 0157 02/08/21 0051 02/09/21 0203 02/10/21 0300  WBC 10.0 7.0 6.0 7.3 5.7  HGB 8.6* 8.4* 9.2* 9.5* 8.6*  HCT 27.0* 26.0* 28.5* 29.5* 26.9*  MCV 87.9 87.8 88.0 88.9 87.9  PLT 322 322 451* 372 073   Basic Metabolic Panel: Recent Labs  Lab 02/06/21 0325 02/07/21 0157 02/08/21 0051 02/08/21 0758 02/09/21 0203 02/10/21 0300  NA 139 139 140  --  140 140  K 3.7 3.4* 5.9* 3.7 3.6 3.4*  CL 103 101 103  --  100 98  CO2 30 35* 29  --  34* 34*  GLUCOSE 127* 131*  126*  --  111* 109*  BUN <5* <5* 7  --  6 6  CREATININE 0.92 0.88 0.82  --  0.88 0.84  CALCIUM 8.3* 8.2* 8.2*  --  8.4* 8.3*   GFR: Estimated Creatinine Clearance: 91.1 mL/min (by C-G formula based on SCr of 0.84 mg/dL). Liver Function Tests: Recent Labs  Lab 02/04/21 0042  AST 12*  ALT 18  ALKPHOS 115  BILITOT 0.7  PROT 5.2*  ALBUMIN 2.1*   No results for input(s): LIPASE, AMYLASE in the last 168 hours. No results for input(s): AMMONIA in the last 168 hours. Coagulation Profile: Recent Labs  Lab 02/06/21 0325 02/07/21 0157 02/08/21 0307 02/09/21 0203 02/10/21 0300  INR 1.3* 1.2  1.2 1.3* 1.2   Cardiac Enzymes: No results for input(s): CKTOTAL, CKMB, CKMBINDEX, TROPONINI in the last 168 hours. BNP (last 3 results) No results for input(s): PROBNP in the last 8760 hours. HbA1C: No results for input(s): HGBA1C in the last 72 hours. CBG: Recent Labs  Lab 02/09/21 1556 02/09/21 1953 02/09/21 2352 02/10/21 0359 02/10/21 0742  GLUCAP 107* 145* 93 118* 117*   Lipid Profile: No results for input(s): CHOL, HDL, LDLCALC, TRIG, CHOLHDL, LDLDIRECT in the last 72 hours. Thyroid Function Tests: No results for input(s): TSH, T4TOTAL, FREET4, T3FREE, THYROIDAB in the last 72 hours. Anemia Panel: No results for input(s): VITAMINB12, FOLATE, FERRITIN, TIBC, IRON, RETICCTPCT in the last 72 hours. Sepsis Labs: No results for input(s): PROCALCITON, LATICACIDVEN in the last 168 hours.  Recent Results (from the past 240 hour(s))  Resp Panel by RT-PCR (Flu A&B, Covid) Nasopharyngeal Swab     Status: None   Collection Time: 01/31/21  7:47 PM   Specimen: Nasopharyngeal Swab; Nasopharyngeal(NP) swabs in vial transport medium  Result Value Ref Range Status   SARS Coronavirus 2 by RT PCR NEGATIVE NEGATIVE Final    Comment: (NOTE) SARS-CoV-2 target nucleic acids are NOT DETECTED.  The SARS-CoV-2 RNA is generally detectable in upper respiratory specimens during the acute phase of  infection. The lowest concentration of SARS-CoV-2 viral copies this assay can detect is 138 copies/mL. A negative result does not preclude SARS-Cov-2 infection and should not be used as the sole basis for treatment or other patient management decisions. A negative result may occur with  improper specimen collection/handling, submission of specimen other than nasopharyngeal swab, presence of viral mutation(s) within the areas targeted by this assay, and inadequate number of viral copies(<138 copies/mL). A negative result must be combined with clinical observations, patient history, and epidemiological information. The expected result is Negative.  Fact Sheet for Patients:  EntrepreneurPulse.com.au  Fact Sheet for Healthcare Providers:  IncredibleEmployment.be  This test is no t yet approved or cleared by the [place] FDA and  has been authorized for detection and/or diagnosis of SARS-CoV-2 by FDA under an Emergency Use Authorization (EUA). This EUA will remain  in effect (meaning this test can be used) for the duration of the COVID-19 declaration under Section 564(b)(1) of the Act, 21 U.S.C.section 360bbb-3(b)(1), unless the authorization is terminated  or revoked sooner.       Influenza A by PCR NEGATIVE NEGATIVE Final   Influenza B by PCR NEGATIVE NEGATIVE Final    Comment: (NOTE) The Xpert Xpress SARS-CoV-2/FLU/RSV plus assay is intended as an aid in the diagnosis of influenza from Nasopharyngeal swab specimens and should not be used as a sole basis for treatment. Nasal washings and aspirates are unacceptable for Xpert Xpress SARS-CoV-2/FLU/RSV testing.  Fact Sheet for Patients: EntrepreneurPulse.com.au  Fact Sheet for Healthcare Providers: IncredibleEmployment.be  This test is not yet approved or cleared by the [place] FDA and has been authorized for detection and/or diagnosis of SARS-CoV-2  by FDA under an Emergency Use Authorization (EUA). This EUA will remain in effect (meaning this test can be used) for the duration of the COVID-19 declaration under Section 564(b)(1) of the Act, 21 U.S.C. section 360bbb-3(b)(1), unless the authorization is terminated or revoked.  Performed at [facility], [address] [place], [place] [address]   MRSA Next Gen by PCR, Nasal     Status: None   Collection Time: 02/01/21 12:26 AM   Specimen: Nasal Mucosa; Nasal Swab  Result Value Ref Range Status  MRSA by PCR Next Gen NOT DETECTED NOT DETECTED Final    Comment: (NOTE) The GeneXpert MRSA Assay (FDA approved for NASAL specimens only), is one component of a comprehensive MRSA colonization surveillance program. It is not intended to diagnose MRSA infection nor to guide or monitor treatment for MRSA infections. Test performance is not FDA approved in patients less than 93 years old. Performed at [facility], [place] [address], [place], [place] [address]   Culture, blood (routine x 2)     Status: None   Collection Time: 02/01/21  2:10 AM   Specimen: BLOOD  Result Value Ref Range Status   Specimen Description BLOOD LEFT ANTECUBITAL  Final   Special Requests   Final    BOTTLES DRAWN AEROBIC AND ANAEROBIC Blood Culture results may not be optimal due to an inadequate volume of blood received in culture bottles   Culture   Final    NO GROWTH 5 DAYS Performed at [facility], [place] [address], [place], [place] [address]    Report Status 02/06/2021 FINAL  Final  Culture, blood (routine x 2)     Status: None   Collection Time: 02/01/21  2:21 AM   Specimen: BLOOD LEFT HAND  Result Value Ref Range Status   Specimen Description BLOOD LEFT HAND  Final   Special Requests   Final    BOTTLES DRAWN AEROBIC AND ANAEROBIC Blood Culture adequate volume   Culture   Final    NO GROWTH 5 DAYS Performed at [facility], [place] [address], [place], [place] [address]     Report Status 02/06/2021 FINAL  Final         Radiology Studies: No results found.      Scheduled Meds:  Chlorhexidine Gluconate Cloth  6 each Topical Daily   enoxaparin (LOVENOX) injection  90 mg Subcutaneous Q12H   escitalopram  10 mg Oral Daily   feeding supplement  237 mL Oral BID BM   insulin aspart  1-3 Units Subcutaneous Q4H   pantoprazole (PROTONIX) IV  40 mg Intravenous Q12H   potassium chloride  40 mEq Oral Once   Continuous Infusions:  meropenem (MERREM) IV 1 g (02/10/21 0217)   promethazine (PHENERGAN) injection (IM or IVPB)       LOS: 9 days    Time spent: 25 mins,More than 50% of that time was spent in counseling and/or coordination of care.      Shelly Coss, MD [facility] P7/06/2021, 7:55 AM

## 2021-02-10 NOTE — Progress Notes (Signed)
[facility] Gastroenterology Progress Note  BRIENNE LIGUORI 50 y.o. 1971-03-19  CC: Complicated pancreatitis with large pseudocyst   Subjective: Patient seen and examined at bedside.  Had 1 episode of vomiting yesterday after drinking Ensure.  Continues to have substernal pressure sensation and nausea   ROS : Febrile, negative for chest pain   Objective: Vital signs in last 24 hours: Vitals:   02/09/21 1504 02/10/21 0358  BP: 128/88 124/79  Pulse: 82 81  Resp: 16 18  Temp: (!) 97.5 F (36.4 C) 98.4 F (36.9 C)  SpO2: 97% 95%    Physical Exam:  General:  Alert, cooperative, no distress, appears stated age  Head:  Normocephalic, without obvious abnormality, atraumatic  Eyes:  , EOM's intact,   Lungs:   Clear to auscultation bilaterally, respirations unlabored  Heart:  Regular rate and rhythm, S1, S2 normal  Abdomen:   Soft, upper quadrant discomfort on palpation without any significant tenderness, bowel sounds active all four quadrants,  no masses,   Extremities: Extremities normal, atraumatic, no  edema  Pulses: 2+ and symmetric    Lab Results: Recent Labs    02/09/21 0203 02/10/21 0300  NA 140 140  K 3.6 3.4*  CL 100 98  CO2 34* 34*  GLUCOSE 111* 109*  BUN 6 6  CREATININE 0.88 0.84  CALCIUM 8.4* 8.3*   No results for input(s): AST, ALT, ALKPHOS, BILITOT, PROT, ALBUMIN in the last 72 hours. Recent Labs    02/09/21 0203 02/10/21 0300  WBC 7.3 5.7  HGB 9.5* 8.6*  HCT 29.5* 26.9*  MCV 88.9 87.9  PLT 372 311   Recent Labs    02/09/21 0203 02/10/21 0300  LABPROT 15.9* 15.4*  INR 1.3* 1.2      Assessment/Plan: -Complicated pancreatitis with pancreatic necrosis and  large pseudocyst.CTA negative for  arterial abnormality or pseudoaneurysm -Portal vein thrombosis.  On Lovenox  Recommendations ------------------------ -Discussed with Dr. Rush Landmark.  Unfortunately not able to get cystogastrostomy done here until late next week. -Case discussed with Dr.  Harley Hallmark at [facility].  He has accepted inpatient transfer to [facility] for further management. -Discussed with hospitalist to arrange for inpatient transfer. -Continue other supportive care.  GI will follow   Otis Brace MD, [place] 02/10/2021, 12:35 PM  Contact #  [phone]

## 2021-02-10 NOTE — Discharge Summary (Addendum)
Physician Discharge Summary  HUNTLEIGH DOOLEN NUU:[id number] DOB: [birth year] DOA: 01/31/2021  PCP: [name], FNP  Admit date: 01/31/2021 Discharge date: 02/11/21  Admitted From: Home Disposition:  Transfer to [facility]  Discharge Condition:Stable CODE STATUS:FULL, Diet recommendation: Heart Healthy    Brief/Interim Summary:  Patient is a 50 yo woman with a hx of anxiety/depression, hx of lung nodules, recent pancreatitis, here with n/v, generalized abdominal pain, fever. Acute intractable nausea a vomiting 24h prior to admission. Took tylenol and zofran with no improvement. Recently hospitalized from 6/7-6/22 at [facility] with post ERCP pancreatitis. Marland KitchenShe was transferred to [facility] for pancreatitis with concern for pancreatic necrosis and large pancreatic pseudocyst for interventional GI follow-up and work-up.  There was plan for  cystogastrostomy as per GI but due to uncertainty of the scheduling, patient will be transferred to [facility]  He has she has been accepted by Dr. Harley Hallmark,, gastroenterology, at [facility].  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 - Continue meropenem - Blood cultures remain negative - Continue pain meds,antiemetics - GI following.she underwent upper endoscopic ultrasound on 02/07/2021 -plan for  cystogastrostomy,but due to uncertainty of the scheduling, patient will be transferred to [facility].  He has she has been accepted by Dr. Harley Hallmark,, gastroenterology, at [facility].   coffee ground emesis in the setting of GERD without  history of GI bleed, resolved Acute blood loss anemia, stabilizing On EGD 02/01/21: moderately severe esophagitis, one superficial esophageal ulcer. Diffuse mild inflammation due to vascular congestion in stomach. Extrinsic compression of stomach by psudeocyst. -Continue PPI twice daily, on low fat diet -Recommended to eat small portion of meals -Hb stable in the range of 8   Hyperglycemia, concern for endocrine pancreatic dysfunction -No known history of diabetes or hyper/hypoglycemic episodes  -High risk for pancreatic destruction and subsequent diabetes, will need close outpatient follow-up   Portal Vein thrombosis -Transitioned from heparin drip to Lovenox given IV issues and labile heparin levels -likely transition to DOAC at discharge   Incidentally noted pulmonary nodules -OP follow up as previously scheduled   Anxiety/depression -Continue home lexapro   Bilateral lower extremity swelling Most likely secondary to IV fluids that she received during the hospitalization.  I have recommended her to  keep her legs elevated while in bed.  IV fluids discontinued     Discharge Diagnoses:  Active Problems:   Pancreatitis    Discharge Instructions   Allergies as of 02/10/2021   No Known Allergies      Medication List     STOP taking these medications    omeprazole 40 MG capsule Commonly known as: PRILOSEC   ondansetron 4 MG disintegrating tablet Commonly known as: Zofran ODT       TAKE these medications    ALPRAZolam 0.5 MG tablet Commonly known as: XANAX Take 0.5 mg by mouth at bedtime as needed for anxiety or sleep.   docusate sodium 100 MG capsule Commonly known as: COLACE Take 1 capsule (100 mg total) by mouth 2 (two) times daily as needed for mild constipation.   enoxaparin 100 MG/ML injection Commonly known as: LOVENOX Inject 0.9 mLs (90  mg total) into the skin every 12 (twelve) hours.   escitalopram 10 MG tablet Commonly known as: LEXAPRO Take 10  mg by mouth daily.   feeding supplement Liqd Take 237 mLs by mouth 2 (two) times daily between meals. Start taking on: February 11, 2021   meropenem 1 g in sodium chloride 0.9 % 100 mL Inject 1 g into the vein every 8 (eight) hours.   methocarbamol 500 MG tablet Commonly known as: ROBAXIN Take 500 mg by mouth every 6 (six) hours as needed for muscle spasms.   morphine 2 MG/ML injection Inject 0.5 mLs (1 mg total) into the vein every 2 (two) hours as needed.   ondansetron 4 MG/2ML Soln injection Commonly known as: ZOFRAN Inject 2 mLs (4 mg total) into the vein every 6 (six) hours as needed for nausea.   oxyCODONE 5 MG immediate release tablet Commonly known as: Oxy IR/ROXICODONE Take 1-2 tablets (5-10 mg total) by mouth every 4 (four) hours as needed for moderate pain or severe pain.   pantoprazole 40 MG injection Commonly known as: PROTONIX Inject 40 mg into the vein every 12 (twelve) hours.   polyethylene glycol 17 g packet Commonly known as: MIRALAX / GLYCOLAX Take 17 g by mouth daily as needed for moderate constipation.   simethicone 80 MG chewable tablet Commonly known as: MYLICON Chew 1 tablet (80 mg total) by mouth every 6 (six) hours as needed for flatulence.   sodium chloride 0.9 % SOLN 50 mL with promethazine 25 MG/ML SOLN 12.5 mg Inject 12.5 mg into the vein every 6 (six) hours as needed.   traMADol 50 MG tablet Commonly known as: ULTRAM Take 50 mg by mouth at bedtime as needed for moderate pain.        No Known Allergies  Consultations: GI   Procedures/Studies: DG Abd 1 View  Result Date: 01/17/2021 CLINICAL DATA:  NG tube placement EXAM: ABDOMEN - 1 VIEW COMPARISON:  01/17/2021 FINDINGS: Esophageal tube has been retracted, the tip now overlies the mid stomach. Left greater than right pleural effusions with basilar airspace disease. IMPRESSION: 1. Esophageal tube tip now overlies the mid gastric region 2. Bilateral pleural effusions and basilar airspace  disease. Electronically Signed   By: Donavan Foil M.D.   On: 01/17/2021 15:51   DG Abd 1 View  Result Date: 01/13/2021 CLINICAL DATA:  Check Dobbhoff placement EXAM: ABDOMEN - 1 VIEW COMPARISON:  None. FINDINGS: Weighted feeding catheter is noted in the second portion of the duodenum. Scattered large and small bowel gas is noted. IMPRESSION: Dobbhoff catheter within the second portion of the duodenum. Electronically Signed   By: Inez Catalina M.D.   On: 01/13/2021 15:15   CT ANGIO ABDOMEN W &/OR WO CONTRAST  Result Date: 02/07/2021 CLINICAL DATA:  Possible pseudoaneurysm on recent endoscopic ultrasound EXAM: CT ANGIOGRAPHY ABDOMEN TECHNIQUE: Multidetector CT imaging of the abdomen was performed using the standard protocol during bolus administration of intravenous contrast. Multiplanar reconstructed images and MIPs were obtained and reviewed to evaluate the vascular anatomy. CONTRAST:  136mL OMNIPAQUE IOHEXOL 350 MG/ML SOLN COMPARISON:  01/31/2021 FINDINGS: VASCULAR Aorta: Normal caliber aorta without aneurysm, dissection, vasculitis or significant stenosis. Celiac: Patent without evidence of aneurysm, dissection, vasculitis or significant stenosis. Splenic artery specifically is well visualized and tortuous in nature. It extends to the splenic hilum in a normal fashion and although coursing adjacent to the pancreatic pseudocyst this is felt to not represent the vascular structure seen on recent endoscopic ultrasound. No pseudoaneurysm is  identified. SMA: Patent without evidence of aneurysm, dissection, vasculitis or significant stenosis. Renals: Both renal arteries are patent without evidence of aneurysm, dissection, vasculitis, fibromuscular dysplasia or significant stenosis. IMA: Patent without evidence of aneurysm, dissection, vasculitis or significant stenosis. Veins: In the interval from the prior examination there is been progressive thrombosis of the left portal vein with differential enhancement in  the left lobe of the liver. No normal left portal venous branches are seen. There is thrombus within the main portal vein which appears more prominent than that seen on the prior exam as well as thrombus within peripheral right portal venous branches best seen on image numbers 62 and 81 of series 8. The splenic vein is prominent likely related to the more central portal venous thrombus and courses along the posterior aspect of the pseudocyst and is felt to correspond to that seen on the recent endoscopic ultrasound. The remainder of the venous structures appear within normal limits. Review of the MIP images confirms the above findings. NON-VASCULAR Lower chest: Small bilateral pleural effusions are noted. Left greater than right consolidation in the lower lobes is seen. No parenchymal nodules are identified. Hepatobiliary: Heterogeneous enhancement is noted within the liver related to the progressive thrombosis of the portal venous system as described above. Generalized fatty infiltration of the liver is noted. No discrete mass is seen. Gallbladder has been surgically removed. Pancreas: Pancreas is similar to that seen on the prior exam with evidence of diffuse pancreatic necrosis only minimal residual normal enhancing pancreatic tissue is seen. Large peripancreatic fluid collection is noted along the central portion of the pancreatic head and extending along the course of the body and tail of the pancreas similar to that seen on the prior exam. It measures approximately 17 x 6.3 cm in transverse and AP dimensions. It extends approximately 8 cm in length in the craniocaudad projection. The splenic artery and splenic vein course along the posterior aspect of the pseudocyst as previously described although no evidence of extravasation of contrast into the pseudocyst is noted to suggest hemorrhage and no pseudoaneurysm is seen. The pseudocyst extends in a fashion surrounding the splenic artery in some locations  although no large vascular structure is noted interposed between the body of the stomach and the central large portion of the pseudocyst. Spleen: Normal in size without focal abnormality. Adrenals/Urinary Tract: Adrenal glands are within normal limits. Kidneys demonstrate a normal enhancement pattern bilaterally. No renal calculi are noted. Normal excretion of contrast is noted bilaterally. Ureters are within normal limits without obstructive change. Bladder is not included on this exam. Stomach/Bowel: Stomach is predominately decompressed lying anterior to the pancreatic pseudocyst as previously described. Again no large intervening vascular structure is noted between the body of the stomach in the main portion of the pseudocyst. These are predominately noted posterior to the pseudocyst with the exception of 1 area in the region of the splenic hilum best noted on image number 159 of series 7. The visualized colon and small bowel appear within normal limits. Lymphatic: No sizable adenopathy is identified to correspond with that seen on recent endoscopic ultrasound. Other: No significant ascites is noted. Musculoskeletal: Degenerative changes of the thoracolumbar spine are seen. IMPRESSION: VASCULAR No arterial abnormality is noted. Specifically no splenic artery pseudoaneurysm is seen. The splenic vein is prominent due to the central portal venous thrombus and courses along the posterior aspect of the pseudocyst primarily. In the region of the splenic hilum, the splenic artery and vein are noted adjacent to the  most superior posterior aspect of the pseudocyst and likely correspond to that seen on recent ultrasound. Progressive thrombosis involving the main portal vein and its branches within the liver particularly on the left. NON-VASCULAR Large pancreatic pseudocyst remains similar to that seen on the prior exam. Small bilateral pleural effusions with bibasilar atelectatic changes. Differential enhancement in the  liver related to progressive portal venous thrombosis when compared with the prior exam. Electronically Signed   By: Inez Catalina M.D.   On: 02/07/2021 20:16   CT Abdomen Pelvis W Contrast  Result Date: 01/31/2021 CLINICAL DATA:  Acute abdominal pain and nausea and vomiting. Pancreatitis. EXAM: CT ABDOMEN AND PELVIS WITH CONTRAST TECHNIQUE: Multidetector CT imaging of the abdomen and pelvis was performed using the standard protocol following bolus administration of intravenous contrast. CONTRAST:  11mL OMNIPAQUE IOHEXOL 300 MG/ML  SOLN COMPARISON:  01/07/2021 FINDINGS: Lower Chest: No acute findings. Hepatobiliary: No hepatic masses identified. Prior cholecystectomy. No evidence of biliary obstruction. Peripancreatic fluid described below compresses the superior mesenteric and main portal veins, and there is nonocclusive thrombus within the distal main and proximal left portal veins. Pancreas: Diffuse pancreatic necrosis is now seen, with a large rim enhancing fluid collection throughout the region of the pancreas which measures approximately 18.3 x 6.3 cm. This is consistent with a large pseudocyst. There has also been development of multiple smaller rim enhancing fluid collections which extend into the porta hepatis, gastrohepatic ligament, small bowel mesentery and left paracolic gutter, consistent with smaller pseudocysts. Spleen: Within normal limits in size and appearance. Adrenals/Urinary Tract: No masses identified. No evidence of ureteral calculi or hydronephrosis. Stomach/Bowel: No evidence of obstruction, inflammatory process or abnormal fluid collections. Vascular/Lymphatic: Mild lymphadenopathy in the central small bowel mesentery, likely reactive in etiology. Reproductive:  No mass or other significant abnormality. Other: Small amount of free pelvic fluid, without significant change. Musculoskeletal:  No suspicious bone lesions identified. IMPRESSION: Severe pancreatitis with interval development of  diffuse pancreatic necrosis, and numerous pancreatic pseudocysts, largest measuring 18 x 6 cm. 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 Mild central mesenteric lymphadenopathy, likely reactive in etiology. Electronically Signed   By: Marlaine Hind M.D.   On: 01/31/2021 19:04   DG CHEST PORT 1 VIEW  Result Date: 02/01/2021 CLINICAL DATA:  Pleural effusion EXAM: PORTABLE CHEST 1 VIEW COMPARISON:  01/18/2019 FINDINGS: Decreased size of small left pleural effusion with basilar atelectasis. Right lung is clear. Normal cardiomediastinal contours. IMPRESSION: Decreased size of small left pleural effusion and basilar atelectasis. Electronically Signed   By: Ulyses Jarred M.D.   On: 02/01/2021 01:06   DG CHEST PORT 1 VIEW  Result Date: 01/17/2021 CLINICAL DATA:  Pleural effusions.  Pancreatitis. EXAM: PORTABLE CHEST 1 VIEW COMPARISON:  January 13, 2021. FINDINGS: Moderate bilateral pleural effusions, likely slightly improved. Overlying bibasilar opacities. No visible pneumothorax on this semi erect radiograph. Patient is rotated to the right with right paratracheal lucency representing the trachea. Enteric tube courses below the diaphragm in outside the field of view. Cardiac silhouette is largely obscured, but likely similar to prior. IMPRESSION: 1. Moderate bilateral pleural effusions, likely slightly improved. 2. Overlying bibasilar opacities, which could represent compressive atelectasis and/or pneumonia. Electronically Signed   By: Margaretha Sheffield MD   On: 01/17/2021 11:19   DG CHEST PORT 1 VIEW  Result Date: 01/13/2021 CLINICAL DATA:  Shortness of breath EXAM: PORTABLE CHEST 1 VIEW COMPARISON:  January 09, 2021 FINDINGS: There are pleural effusions bilaterally with atelectasis and patchy  infiltrate in each lower lung region. Heart is borderline enlarged with pulmonary vascularity normal. No evident adenopathy. No pneumothorax. No bone lesions. IMPRESSION: Pleural effusions  bilaterally. Atelectasis and questionable superimposed pneumonia in the lower lung regions. Stable cardiac prominence. Electronically Signed   By: Lowella Grip III M.D.   On: 01/13/2021 09:23   DG Abd Portable 1V  Result Date: 01/19/2021 CLINICAL DATA:  Enteric catheter placement EXAM: PORTABLE ABDOMEN - 1 VIEW COMPARISON:  01/17/2021 at 3:26 p.m. FINDINGS: Frontal view of the abdomen and pelvis was performed, excluding the hemidiaphragms and lower pelvis by collimation. The weighted tip of an enteric feeding catheter overlies the gastric antrum/pyloric region. Paucity of bowel gas. No masses or abnormal calcifications. IMPRESSION: 1. Enteric catheter weighted tip overlying the gastric antrum/pylorus. Electronically Signed   By: Randa Ngo M.D.   On: 01/19/2021 00:50   DG Abd Portable 2V  Result Date: 01/17/2021 CLINICAL DATA:  Abdominal bloating.  Pleural effusions. EXAM: PORTABLE ABDOMEN - 2 VIEW COMPARISON:  January 13, 2021. FINDINGS: Dobbhoff catheter with tip in similar position, likely in the proximal duodenum. Right upper quadrant clips. Nonobstructive bowel gas pattern with gas in small bowel and colon. No dilated gas-filled bowel loops identified. No obvious free air on these supine/semi erect radiographs. No abnormal calcifications. Chest is further evaluated on concurrent chest radiograph. IMPRESSION: 1. Dobbhoff catheter with tip in similar position, likely in the proximal duodenum. 2. Nonobstructive bowel gas pattern. 3. Chest is further evaluated on concurrent chest radiograph. Electronically Signed   By: Margaretha Sheffield MD   On: 01/17/2021 11:16      Subjective:   Discharge Exam: Vitals:   02/09/21 1504 02/10/21 0358  BP: 128/88 124/79  Pulse: 82 81  Resp: 16 18  Temp: (!) 97.5 F (36.4 C) 98.4 F (36.9 C)  SpO2: 97% 95%   Vitals:   02/08/21 2258 02/09/21 0508 02/09/21 1504 02/10/21 0358  BP: 121/83 126/82 128/88 124/79  Pulse: 94 81 82 81  Resp: 14 15 16 18    Temp: 98 F (36.7 C) 98 F (36.7 C) (!) 97.5 F (36.4 C) 98.4 F (36.9 C)  TempSrc: Oral Oral Oral   SpO2: 94% 96% 97% 95%  Weight:      Height:        General: Pt is alert, awake, not in acute distress Cardiovascular: RRR, S1/S2 +, no rubs, no gallops Respiratory: CTA bilaterally, no wheezing, no rhonchi Abdominal: Soft, NT, ND, bowel sounds + Extremities: no edema, no cyanosis    The results of significant diagnostics from this hospitalization (including imaging, microbiology, ancillary and laboratory) are listed below for reference.     Microbiology: Recent Results (from the past 240 hour(s))  Resp Panel by RT-PCR (Flu A&B, Covid) Nasopharyngeal Swab     Status: None   Collection Time: 01/31/21  7:47 PM   Specimen: Nasopharyngeal Swab; Nasopharyngeal(NP) swabs in vial transport medium  Result Value Ref Range Status   SARS Coronavirus 2 by RT PCR NEGATIVE NEGATIVE Final    Comment: (NOTE) SARS-CoV-2 target nucleic acids are NOT DETECTED.  The SARS-CoV-2 RNA is generally detectable in upper respiratory specimens during the acute phase of infection. The lowest concentration of SARS-CoV-2 viral copies this assay can detect is 138 copies/mL. A negative result does not preclude SARS-Cov-2 infection and should not be used as the sole basis for treatment or other patient management decisions. A negative result may occur with  improper specimen collection/handling, submission of specimen other than nasopharyngeal swab,  presence of viral mutation(s) within the areas targeted by this assay, and inadequate number of viral copies(<138 copies/mL). A negative result must be combined with clinical observations, patient history, and epidemiological information. The expected result is Negative.  Fact Sheet for Patients:  EntrepreneurPulse.com.au  Fact Sheet for Healthcare Providers:  IncredibleEmployment.be  This test is no t yet approved or  cleared by the [place] FDA and  has been authorized for detection and/or diagnosis of SARS-CoV-2 by FDA under an Emergency Use Authorization (EUA). This EUA will remain  in effect (meaning this test can be used) for the duration of the COVID-19 declaration under Section 564(b)(1) of the Act, 21 U.S.C.section 360bbb-3(b)(1), unless the authorization is terminated  or revoked sooner.       Influenza A by PCR NEGATIVE NEGATIVE Final   Influenza B by PCR NEGATIVE NEGATIVE Final    Comment: (NOTE) The Xpert Xpress SARS-CoV-2/FLU/RSV plus assay is intended as an aid in the diagnosis of influenza from Nasopharyngeal swab specimens and should not be used as a sole basis for treatment. Nasal washings and aspirates are unacceptable for Xpert Xpress SARS-CoV-2/FLU/RSV testing.  Fact Sheet for Patients: EntrepreneurPulse.com.au  Fact Sheet for Healthcare Providers: IncredibleEmployment.be  This test is not yet approved or cleared by the [place] FDA and has been authorized for detection and/or diagnosis of SARS-CoV-2 by FDA under an Emergency Use Authorization (EUA). This EUA will remain in effect (meaning this test can be used) for the duration of the COVID-19 declaration under Section 564(b)(1) of the Act, 21 U.S.C. section 360bbb-3(b)(1), unless the authorization is terminated or revoked.  Performed at [facility], [address] [place], [place] [address]   MRSA Next Gen by PCR, Nasal     Status: None   Collection Time: 02/01/21 12:26 AM   Specimen: Nasal Mucosa; Nasal Swab  Result Value Ref Range Status   MRSA by PCR Next Gen NOT DETECTED NOT DETECTED Final    Comment: (NOTE) The GeneXpert MRSA Assay (FDA approved for NASAL specimens only), is one component of a comprehensive MRSA colonization surveillance program. It is not intended to diagnose MRSA infection nor to guide or monitor treatment for MRSA  infections. Test performance is not FDA approved in patients less than 61 years old. Performed at [facility], [place] [address], [place], [place] [address]   Culture, blood (routine x 2)     Status: None   Collection Time: 02/01/21  2:10 AM   Specimen: BLOOD  Result Value Ref Range Status   Specimen Description BLOOD LEFT ANTECUBITAL  Final   Special Requests   Final    BOTTLES DRAWN AEROBIC AND ANAEROBIC Blood Culture results may not be optimal due to an inadequate volume of blood received in culture bottles   Culture   Final    NO GROWTH 5 DAYS Performed at [facility], [place] [address] [place], [place] [address]    Report Status 02/06/2021 FINAL  Final  Culture, blood (routine x 2)     Status: None   Collection Time: 02/01/21  2:21 AM   Specimen: BLOOD LEFT HAND  Result Value Ref Range Status   Specimen Description BLOOD LEFT HAND  Final   Special Requests   Final    BOTTLES DRAWN AEROBIC AND ANAEROBIC Blood Culture adequate volume   Culture   Final    NO GROWTH 5 DAYS Performed at [facility], [place] [address], [place], [place] [address]  Report Status 02/06/2021 FINAL  Final     Labs: BNP (last 3 results) Recent Labs    01/17/21 0305  BNP 56.2   Basic Metabolic Panel: Recent Labs  Lab 02/06/21 0325 02/07/21 0157 02/08/21 0051 02/08/21 0758 02/09/21 0203 02/10/21 0300  NA 139 139 140  --  140 140  K 3.7 3.4* 5.9* 3.7 3.6 3.4*  CL 103 101 103  --  100 98  CO2 30 35* 29  --  34* 34*  GLUCOSE 127* 131* 126*  --  111* 109*  BUN <5* <5* 7  --  6 6  CREATININE 0.92 0.88 0.82  --  0.88 0.84  CALCIUM 8.3* 8.2* 8.2*  --  8.4* 8.3*   Liver Function Tests: Recent Labs  Lab 02/04/21 0042  AST 12*  ALT 18  ALKPHOS 115  BILITOT 0.7  PROT 5.2*  ALBUMIN 2.1*   No results for input(s): LIPASE, AMYLASE in the last 168 hours. No results for input(s): AMMONIA in the last 168 hours. CBC: Recent Labs  Lab 02/06/21 0325 02/07/21 0157  02/08/21 0051 02/09/21 0203 02/10/21 0300  WBC 10.0 7.0 6.0 7.3 5.7  HGB 8.6* 8.4* 9.2* 9.5* 8.6*  HCT 27.0* 26.0* 28.5* 29.5* 26.9*  MCV 87.9 87.8 88.0 88.9 87.9  PLT 322 322 451* 372 311   Cardiac Enzymes: No results for input(s): CKTOTAL, CKMB, CKMBINDEX, TROPONINI in the last 168 hours. BNP: Invalid input(s): POCBNP CBG: Recent Labs  Lab 02/09/21 1953 02/09/21 2352 02/10/21 0359 02/10/21 0742 02/10/21 1206  GLUCAP 145* 93 118* 117* 138*   D-Dimer No results for input(s): DDIMER in the last 72 hours. Hgb A1c No results for input(s): HGBA1C in the last 72 hours. Lipid Profile No results for input(s): CHOL, HDL, LDLCALC, TRIG, CHOLHDL, LDLDIRECT in the last 72 hours. Thyroid function studies No results for input(s): TSH, T4TOTAL, T3FREE, THYROIDAB in the last 72 hours.  Invalid input(s): FREET3 Anemia work up No results for input(s): VITAMINB12, FOLATE, FERRITIN, TIBC, IRON, RETICCTPCT in the last 72 hours. Urinalysis    Component Value Date/Time   COLORURINE YELLOW 01/31/2021 1539   APPEARANCEUR HAZY (A) 01/31/2021 1539   LABSPEC 1.006 01/31/2021 1539   PHURINE 5.5 01/31/2021 1539   GLUCOSEU NEGATIVE 01/31/2021 1539   HGBUR NEGATIVE 01/31/2021 1539   BILIRUBINUR NEGATIVE 01/31/2021 1539   KETONESUR NEGATIVE 01/31/2021 1539   PROTEINUR NEGATIVE 01/31/2021 1539   UROBILINOGEN 0.2 09/22/2012 1625   NITRITE NEGATIVE 01/31/2021 1539   LEUKOCYTESUR LARGE (A) 01/31/2021 1539   Sepsis Labs Invalid input(s): PROCALCITONIN,  WBC,  LACTICIDVEN Microbiology Recent Results (from the past 240 hour(s))  Resp Panel by RT-PCR (Flu A&B, Covid) Nasopharyngeal Swab     Status: None   Collection Time: 01/31/21  7:47 PM   Specimen: Nasopharyngeal Swab; Nasopharyngeal(NP) swabs in vial transport medium  Result Value Ref Range Status   SARS Coronavirus 2 by RT PCR NEGATIVE NEGATIVE Final    Comment: (NOTE) SARS-CoV-2 target nucleic acids are NOT DETECTED.  The SARS-CoV-2 RNA  is generally detectable in upper respiratory specimens during the acute phase of infection. The lowest concentration of SARS-CoV-2 viral copies this assay can detect is 138 copies/mL. A negative result does not preclude SARS-Cov-2 infection and should not be used as the sole basis for treatment or other patient management decisions. A negative result may occur with  improper specimen collection/handling, submission of specimen other than nasopharyngeal swab, presence of viral mutation(s) within the areas targeted by this assay, and inadequate  number of viral copies(<138 copies/mL). A negative result must be combined with clinical observations, patient history, and epidemiological information. The expected result is Negative.  Fact Sheet for Patients:  EntrepreneurPulse.com.au  Fact Sheet for Healthcare Providers:  IncredibleEmployment.be  This test is no t yet approved or cleared by the [place] FDA and  has been authorized for detection and/or diagnosis of SARS-CoV-2 by FDA under an Emergency Use Authorization (EUA). This EUA will remain  in effect (meaning this test can be used) for the duration of the COVID-19 declaration under Section 564(b)(1) of the Act, 21 U.S.C.section 360bbb-3(b)(1), unless the authorization is terminated  or revoked sooner.       Influenza A by PCR NEGATIVE NEGATIVE Final   Influenza B by PCR NEGATIVE NEGATIVE Final    Comment: (NOTE) The Xpert Xpress SARS-CoV-2/FLU/RSV plus assay is intended as an aid in the diagnosis of influenza from Nasopharyngeal swab specimens and should not be used as a sole basis for treatment. Nasal washings and aspirates are unacceptable for Xpert Xpress SARS-CoV-2/FLU/RSV testing.  Fact Sheet for Patients: EntrepreneurPulse.com.au  Fact Sheet for Healthcare Providers: IncredibleEmployment.be  This test is not yet approved or cleared by the  [place] FDA and has been authorized for detection and/or diagnosis of SARS-CoV-2 by FDA under an Emergency Use Authorization (EUA). This EUA will remain in effect (meaning this test can be used) for the duration of the COVID-19 declaration under Section 564(b)(1) of the Act, 21 U.S.C. section 360bbb-3(b)(1), unless the authorization is terminated or revoked.  Performed at [facility], [address] [place], [place] [address]   MRSA Next Gen by PCR, Nasal     Status: None   Collection Time: 02/01/21 12:26 AM   Specimen: Nasal Mucosa; Nasal Swab  Result Value Ref Range Status   MRSA by PCR Next Gen NOT DETECTED NOT DETECTED Final    Comment: (NOTE) The GeneXpert MRSA Assay (FDA approved for NASAL specimens only), is one component of a comprehensive MRSA colonization surveillance program. It is not intended to diagnose MRSA infection nor to guide or monitor treatment for MRSA infections. Test performance is not FDA approved in patients less than 38 years old. Performed at [facility], [place] [address], [place], [place] [address]   Culture, blood (routine x 2)     Status: None   Collection Time: 02/01/21  2:10 AM   Specimen: BLOOD  Result Value Ref Range Status   Specimen Description BLOOD LEFT ANTECUBITAL  Final   Special Requests   Final    BOTTLES DRAWN AEROBIC AND ANAEROBIC Blood Culture results may not be optimal due to an inadequate volume of blood received in culture bottles   Culture   Final    NO GROWTH 5 DAYS Performed at [facility], [place] [address], [place], [place] [address]    Report Status 02/06/2021 FINAL  Final  Culture, blood (routine x 2)     Status: None   Collection Time: 02/01/21  2:21 AM   Specimen: BLOOD LEFT HAND  Result Value Ref Range Status   Specimen Description BLOOD LEFT HAND  Final   Special Requests   Final    BOTTLES DRAWN AEROBIC AND ANAEROBIC Blood Culture adequate volume   Culture   Final    NO  GROWTH 5 DAYS Performed at [facility], [place] [address] [place], [place] [address]    Report Status 02/06/2021 FINAL  Final    Please note: You were  cared for by a hospitalist during your hospital stay. Once you are discharged, your primary care physician will handle any further medical issues. Please note that NO REFILLS for any discharge medications will be authorized once you are discharged, as it is imperative that you return to your primary care physician (or establish a relationship with a primary care physician if you do not have one) for your post hospital discharge needs so that they can reassess your need for medications and monitor your lab values.    Time coordinating discharge: 40 minutes  SIGNED:   Shelly Coss, MD  [facility] 02/10/2021, 3:17 PM Pager [phone]  If 7PM-7AM, please contact night-coverage [email or web address] Password [email or web address]

## 2021-02-11 LAB — CBC
HCT: 29.3 % — ABNORMAL LOW (ref 36.0–46.0)
Hemoglobin: 9.3 g/dL — ABNORMAL LOW (ref 12.0–15.0)
MCH: 27.9 pg (ref 26.0–34.0)
MCHC: 31.7 g/dL (ref 30.0–36.0)
MCV: 88 fL (ref 80.0–100.0)
Platelets: 352 10*3/uL (ref 150–400)
RBC: 3.33 MIL/uL — ABNORMAL LOW (ref 3.87–5.11)
RDW: 13.3 % (ref 11.5–15.5)
WBC: 8.1 10*3/uL (ref 4.0–10.5)
nRBC: 0 % (ref 0.0–0.2)

## 2021-02-11 LAB — PROTIME-INR
INR: 1.2 (ref 0.8–1.2)
Prothrombin Time: 14.8 seconds (ref 11.4–15.2)

## 2021-02-11 LAB — GLUCOSE, CAPILLARY
Glucose-Capillary: 127 mg/dL — ABNORMAL HIGH (ref 70–99)
Glucose-Capillary: 131 mg/dL — ABNORMAL HIGH (ref 70–99)
Glucose-Capillary: 121 mg/dL — ABNORMAL HIGH (ref 70–99)

## 2021-02-11 LAB — BASIC METABOLIC PANEL
Anion gap: 5 (ref 5–15)
BUN: 5 mg/dL — ABNORMAL LOW (ref 6–20)
CO2: 32 mmol/L (ref 22–32)
Calcium: 8.6 mg/dL — ABNORMAL LOW (ref 8.9–10.3)
Chloride: 103 mmol/L (ref 98–111)
Creatinine, Ser: 0.84 mg/dL (ref 0.44–1.00)
GFR, Estimated: >60 mL/min (ref >60)
Glucose, Bld: 124 mg/dL — ABNORMAL HIGH (ref 70–99)
Potassium: 3.8 mmol/L (ref 3.5–5.1)
Sodium: 140 mmol/L (ref 135–145)

## 2021-02-11 NOTE — Progress Notes (Signed)
Spoke to [facility] care link  @ [phone], regarding pt transfer, report given, care link to call back with ETA

## 2021-02-11 NOTE — Progress Notes (Signed)
Report called the charge RN Zynia , at [facility] , phone # [phone]

## 2021-02-11 NOTE — Progress Notes (Signed)
Spoke to [facility] @ [phone] Pt to transfer to [place],  #3rd floor, [employer] [address] [place]  Phone#  to charge RN, Javonna  [phone]

## 2021-02-11 NOTE — Progress Notes (Addendum)
Patient seen and examined at the bedside this morning.  Hemodynamically stable.  Comfortable.  Denies any worsening nausea or abdominal pain.  She is being transferred to [facility] today.  No change in the medical management.  Discharge orders and summary are in.

## 2021-02-11 NOTE — Progress Notes (Signed)
-   Patient seen and examined at bedside.  Continues to have epigastric and substernal discomfort.  Stable abdominal exam.  She has been accepted for transfer to [facility] and she will be transferred sometime this morning.  Discussed with hospitalist. Dr. Delrae Alfred has been notified.  Patient was advised to follow-up with [facility] for her further management of complicated pancreatitis.  Otis Brace MD, [place] 02/11/2021, 9:21 AM  Contact #  [phone]

## 2021-03-06 ENCOUNTER — Other Ambulatory Visit: Payer: BC Managed Care – PPO

## 2021-03-31 ENCOUNTER — Other Ambulatory Visit: Payer: Self-pay

## 2021-03-31 ENCOUNTER — Ambulatory Visit (INDEPENDENT_AMBULATORY_CARE_PROVIDER_SITE_OTHER)
Admission: RE | Admit: 2021-03-31 | Discharge: 2021-03-31 | Disposition: A | Discharge status: Home / Self Care | Payer: BC Managed Care – PPO | Source: Ambulatory Visit | Attending: Pulmonary Disease | Admitting: Pulmonary Disease

## 2021-03-31 DIAGNOSIS — R911 Solitary pulmonary nodule: Secondary | ICD-10-CM

## 2021-03-31 DIAGNOSIS — Z789 Other specified health status: Secondary | ICD-10-CM

## 2021-03-31 DIAGNOSIS — R918 Other nonspecific abnormal finding of lung field: Secondary | ICD-10-CM

## 2021-04-09 ENCOUNTER — Other Ambulatory Visit: Payer: Self-pay

## 2021-04-09 ENCOUNTER — Ambulatory Visit (INDEPENDENT_AMBULATORY_CARE_PROVIDER_SITE_OTHER): Payer: BC Managed Care – PPO | Admitting: Pulmonary Disease

## 2021-04-09 ENCOUNTER — Encounter: Payer: Self-pay | Admitting: Pulmonary Disease

## 2021-04-09 VITALS — BP 110/78 | HR 98 | Temp 97.6°F | Ht 67.0 in | Wt 172.0 lb

## 2021-04-09 DIAGNOSIS — R911 Solitary pulmonary nodule: Secondary | ICD-10-CM | POA: Diagnosis not present

## 2021-04-09 DIAGNOSIS — R918 Other nonspecific abnormal finding of lung field: Secondary | ICD-10-CM

## 2021-04-09 DIAGNOSIS — Z789 Other specified health status: Secondary | ICD-10-CM

## 2021-04-09 NOTE — Progress Notes (Signed)
Synopsis: Referred in February 2022 for lung nodule, groundglass opacity by [name], FNP  Subjective:   PATIENT ID: Kimberly Kelly GENDER: female DOB: [birth year], MRN: [id number]  Chief Complaint  Patient presents with   Follow-up    Lung nodule    This is a 50 year old female, past medical history of arthritis, and lifelong non-smoker, paternal grandfather with lung cancer however he was a smoker.  Patient had CT scan imaging that was completed at [facility].  CT scan report is available for review in epic care everywhere.  CT scan was completed on 08/01/2020 which revealed a nodular groundglass focus within the right lower lobe recommending CT follow-up she has known cystic disease within the breast that has routine mammograms.  From a respiratory standpoint patient has no complaints.  Patient's child goes to the [employer] school here in [place] and she knows Dr. Graylon Good from [place] who made recommendations for referral to see me here in clinic for follow-up of the abnormalities seen in the chest.  OV 12/04/2020: Patient here today for CT follow-up completed on 11/22/2020 patient has a persistent right lower lobe groundglass opacity 1.3 cm in size.  Also noted to have several cystic masses within the right breast on CT imaging. She has no respiratory complaints at this time. She is anxious about the persistence of the nodule.   OV 04/09/2021: Here today for follow-up after recent super D CT scan.  Followed in our clinic for a right lower lobe persistent 1.4 cm groundglass opacity.  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  She was seen by GI here as well as atrium at [facility].  She is doing much better after this event.  Slowly starting to recover.  Initially she had been referred to thoracic surgery for consideration of removal of this groundglass  opacity or considerations even for biopsy.  She wants to hold off on all of this for the time being.  I think this is reasonable.    Past Medical History:  Diagnosis Date   Arthritis      Family History  Problem Relation Age of Onset   Breast cancer Paternal Grandmother      Past Surgical History:  Procedure Laterality Date   BIOPSY  02/07/2021   Procedure: BIOPSY;  Surgeon: Mansouraty, Telford Nab., MD;  Location: [place];  Service: Gastroenterology;;   CESAREAN SECTION     CHOLECYSTECTOMY N/A 10/10/2016   Procedure: LAPAROSCOPIC CHOLECYSTECTOMY WITH INTRAOPERATIVE CHOLANGIOGRAM;  Surgeon: Erroll Luna, MD;  Location: [facility];  Service: General;  Laterality: N/A;   ESOPHAGOGASTRODUODENOSCOPY (EGD) WITH PROPOFOL N/A 02/01/2021   Procedure: ESOPHAGOGASTRODUODENOSCOPY (EGD) WITH PROPOFOL;  Surgeon: Clarene Essex, MD;  Location: [facility];  Service: Endoscopy;  Laterality: N/A;   ESOPHAGOGASTRODUODENOSCOPY (EGD) WITH PROPOFOL N/A 02/07/2021   Procedure: ESOPHAGOGASTRODUODENOSCOPY (EGD) WITH PROPOFOL;  Surgeon: Rush Landmark Telford Nab., MD;  Location: [facility];  Service: Gastroenterology;  Laterality: N/A;   SHOULDER ARTHROSCOPY     TUBAL LIGATION     UPPER ESOPHAGEAL ENDOSCOPIC ULTRASOUND (EUS) Left 02/07/2021   Procedure: UPPER ESOPHAGEAL ENDOSCOPIC ULTRASOUND (EUS);  Surgeon: Irving Copas., MD;  Location: [facility];  Service: Gastroenterology;  Laterality: Left;    Social History   Socioeconomic History   Marital status: Married    Spouse name: Not on file   Number of children: Not on file   Years of education: Not  on file   Highest education level: Not on file  Occupational History   Not on file  Tobacco Use   Smoking status: Never   Smokeless tobacco: Never  Vaping Use   Vaping Use: Never used  Substance and Sexual Activity   Alcohol use: Yes    Alcohol/week: 7.0 standard drinks    Types: 7 Glasses of wine per week   Drug use: No   Sexual activity: Not  on file  Other Topics Concern   Not on file  Social History Narrative   Not on file   Social Determinants of Health   Financial Resource Strain: Not on file  Food Insecurity: Not on file  Transportation Needs: Not on file  Physical Activity: Not on file  Stress: Not on file  Social Connections: Not on file  Intimate Partner Violence: Not on file     No Known Allergies   Outpatient Medications Prior to Visit  Medication Sig Dispense Refill   ELIQUIS 5 MG TABS tablet Take by mouth.     escitalopram (LEXAPRO) 10 MG tablet Take 10 mg by mouth daily.     methocarbamol (ROBAXIN) 500 MG tablet Take 500 mg by mouth every 6 (six) hours as needed for muscle spasms.     simethicone (MYLICON) 80 MG chewable tablet Chew 1 tablet (80 mg total) by mouth every 6 (six) hours as needed for flatulence. 30 tablet 0   ALPRAZolam (XANAX) 0.5 MG tablet Take 0.5 mg by mouth at bedtime as needed for anxiety or sleep. (Patient not taking: Reported on 04/09/2021)     docusate sodium (COLACE) 100 MG capsule Take 1 capsule (100 mg total) by mouth 2 (two) times daily as needed for mild constipation. (Patient not taking: Reported on 04/09/2021) 10 capsule 0   enoxaparin (LOVENOX) 100 MG/ML injection Inject 0.9 mLs (90 mg total) into the skin every 12 (twelve) hours. (Patient not taking: Reported on 04/09/2021) 0 mL    feeding supplement (ENSURE ENLIVE / ENSURE PLUS) LIQD Take 237 mLs by mouth 2 (two) times daily between meals. (Patient not taking: Reported on 04/09/2021) 237 mL 12   meropenem 1 g in sodium chloride 0.9 % 100 mL Inject 1 g into the vein every 8 (eight) hours. (Patient not taking: Reported on 04/09/2021)     morphine 2 MG/ML injection Inject 0.5 mLs (1 mg total) into the vein every 2 (two) hours as needed. (Patient not taking: Reported on 04/09/2021) 1 mL 0   ondansetron (ZOFRAN) 4 MG/2ML SOLN injection Inject 2 mLs (4 mg total) into the vein every 6 (six) hours as needed for nausea. (Patient not taking: Reported  on 04/09/2021) 2 mL 0   oxyCODONE (OXY IR/ROXICODONE) 5 MG immediate release tablet Take 1-2 tablets (5-10 mg total) by mouth every 4 (four) hours as needed for moderate pain or severe pain. (Patient not taking: Reported on 04/09/2021) 30 tablet 0   pantoprazole (PROTONIX) 40 MG injection Inject 40 mg into the vein every 12 (twelve) hours. (Patient not taking: Reported on 04/09/2021) 1 each    polyethylene glycol (MIRALAX / GLYCOLAX) 17 g packet Take 17 g by mouth daily as needed for moderate constipation. (Patient not taking: Reported on 04/09/2021) 14 each 0   sodium chloride 0.9 % SOLN 50 mL with promethazine 25 MG/ML SOLN 12.5 mg Inject 12.5 mg into the vein every 6 (six) hours as needed. (Patient not taking: Reported on 04/09/2021)     traMADol (ULTRAM) 50 MG tablet Take 50  mg by mouth at bedtime as needed for moderate pain. (Patient not taking: Reported on 04/09/2021)     No facility-administered medications prior to visit.    Review of Systems  Constitutional:  Negative for chills, fever, malaise/fatigue and weight loss.  HENT:  Negative for hearing loss, sore throat and tinnitus.   Eyes:  Negative for blurred vision and double vision.  Respiratory:  Negative for cough, hemoptysis, sputum production, shortness of breath, wheezing and stridor.   Cardiovascular:  Negative for chest pain, palpitations, orthopnea, leg swelling and PND.  Gastrointestinal:  Positive for abdominal pain. Negative for constipation, diarrhea, heartburn, nausea and vomiting.  Genitourinary:  Negative for dysuria, hematuria and urgency.  Musculoskeletal:  Negative for joint pain and myalgias.  Skin:  Negative for itching and rash.  Neurological:  Negative for dizziness, tingling, weakness and headaches.  Endo/Heme/Allergies:  Negative for environmental allergies. Does not bruise/bleed easily.  Psychiatric/Behavioral:  Negative for depression. The patient is not nervous/anxious and does not have insomnia.   All other systems  reviewed and are negative.   Objective:  Physical Exam Vitals reviewed.  Constitutional:      General: She is not in acute distress.    Appearance: She is well-developed.  HENT:     Head: Normocephalic and atraumatic.  Eyes:     General: No scleral icterus.    Conjunctiva/sclera: Conjunctivae normal.     Pupils: Pupils are equal, round, and reactive to light.  Neck:     Vascular: No JVD.     Trachea: No tracheal deviation.  Cardiovascular:     Rate and Rhythm: Normal rate and regular rhythm.     Heart sounds: Normal heart sounds. No murmur heard. Pulmonary:     Effort: Pulmonary effort is normal. No tachypnea, accessory muscle usage or respiratory distress.     Breath sounds: Normal breath sounds. No stridor. No wheezing, rhonchi or rales.  Abdominal:     General: Bowel sounds are normal. There is no distension.     Palpations: Abdomen is soft.     Tenderness: There is no abdominal tenderness.     Comments: Distended abdomen  Musculoskeletal:        General: No tenderness.     Cervical back: Neck supple.  Lymphadenopathy:     Cervical: No cervical adenopathy.  Skin:    General: Skin is warm and dry.     Capillary Refill: Capillary refill takes less than 2 seconds.     Findings: No rash.  Neurological:     Mental Status: She is alert and oriented to person, place, and time.  Psychiatric:        Behavior: Behavior normal.     Vitals:   04/09/21 1521  BP: 110/78  Pulse: 98  Temp: 97.6 F (36.4 C)  TempSrc: Oral  SpO2: 100%  Weight: 172 lb (78 kg)  Height: 5\' 7"  (1.702 m)   100% on RA BMI Readings from Last 3 Encounters:  04/09/21 26.94 kg/m  02/07/21 30.21 kg/m  01/22/21 32.73 kg/m   Wt Readings from Last 3 Encounters:  04/09/21 172 lb (78 kg)  02/07/21 192 lb 14.4 oz (87.5 kg)  01/22/21 208 lb 15.9 oz (94.8 kg)     CBC    Component Value Date/Time   WBC 8.1 02/11/2021 0403   RBC 3.33 (L) 02/11/2021 0403   HGB 9.3 (L) 02/11/2021 0403   HCT  29.3 (L) 02/11/2021 0403   PLT 352 02/11/2021 0403   MCV 88.0 02/11/2021  0403   MCH 27.9 02/11/2021 0403   MCHC 31.7 02/11/2021 0403   RDW 13.3 02/11/2021 0403   LYMPHSABS 0.8 02/01/2021 0201   MONOABS 0.5 02/01/2021 0201   EOSABS 0.0 02/01/2021 0201   BASOSABS 0.0 02/01/2021 0201    Chest Imaging: CT chest 08/01/2020: 14 mm groundglass nodule within the right lower lobe. The patient's images have been independently reviewed by me.    Pulmonary Functions Testing Results: PFT Results Latest Ref Rng & Units 12/27/2020  FVC-Pre L 3.55  FVC-Predicted Pre % 90  FVC-Post L 3.53  FVC-Predicted Post % 90  Pre FEV1/FVC % % 80  Post FEV1/FCV % % 83  FEV1-Pre L 2.84  FEV1-Predicted Pre % 91  FEV1-Post L 2.92  DLCO uncorrected ml/min/mmHg 31.21  DLCO UNC% % 134  DLCO corrected ml/min/mmHg 31.21  DLCO COR %Predicted % 134  DLVA Predicted % 130  TLC L 5.84  TLC % Predicted % 106  RV % Predicted % 110    FeNO:   Pathology:   Echocardiogram:   Heart Catheterization:     Assessment & Plan:     ICD-10-CM   1. Lung nodule  R91.1 CT Super D Chest Wo Contrast    2. Ground glass opacity present on imaging of lung  R91.8     3. Non-smoker  Z78.9     4. Abnormal CT scan, lung  R91.8       Discussion:  50 year old female, no significant past medical history, incidental lower lobe pulmonary nodule 14 mm in size has been a persistent right lower lobe groundglass lesion.  Plan: With everything going on her life and currently recovering from her multiple hospitalizations and pancreatic issues I think we should hold off on any further steps at this time for the nodule that would be invasive. Patient is agreeable to this plan. Would like to have a repeat noncontrasted CT scan of the chest in 1 year. If the lesion is persistent at this time we can talk about neck steps. I have placed orders for repeat CT in 1 year. Follow-up with me after this.    Current Outpatient  Medications:    ELIQUIS 5 MG TABS tablet, Take by mouth., Disp: , Rfl:    escitalopram (LEXAPRO) 10 MG tablet, Take 10 mg by mouth daily., Disp: , Rfl:    methocarbamol (ROBAXIN) 500 MG tablet, Take 500 mg by mouth every 6 (six) hours as needed for muscle spasms., Disp: , Rfl:    simethicone (MYLICON) 80 MG chewable tablet, Chew 1 tablet (80 mg total) by mouth every 6 (six) hours as needed for flatulence., Disp: 30 tablet, Rfl: 0   ALPRAZolam (XANAX) 0.5 MG tablet, Take 0.5 mg by mouth at bedtime as needed for anxiety or sleep. (Patient not taking: Reported on 04/09/2021), Disp: , Rfl:    docusate sodium (COLACE) 100 MG capsule, Take 1 capsule (100 mg total) by mouth 2 (two) times daily as needed for mild constipation. (Patient not taking: Reported on 04/09/2021), Disp: 10 capsule, Rfl: 0   enoxaparin (LOVENOX) 100 MG/ML injection, Inject 0.9 mLs (90 mg total) into the skin every 12 (twelve) hours. (Patient not taking: Reported on 04/09/2021), Disp: 0 mL, Rfl:    feeding supplement (ENSURE ENLIVE / ENSURE PLUS) LIQD, Take 237 mLs by mouth 2 (two) times daily between meals. (Patient not taking: Reported on 04/09/2021), Disp: 237 mL, Rfl: 12   meropenem 1 g in sodium chloride 0.9 % 100 mL, Inject 1 g into  the vein every 8 (eight) hours. (Patient not taking: Reported on 04/09/2021), Disp: , Rfl:    morphine 2 MG/ML injection, Inject 0.5 mLs (1 mg total) into the vein every 2 (two) hours as needed. (Patient not taking: Reported on 04/09/2021), Disp: 1 mL, Rfl: 0   ondansetron (ZOFRAN) 4 MG/2ML SOLN injection, Inject 2 mLs (4 mg total) into the vein every 6 (six) hours as needed for nausea. (Patient not taking: Reported on 04/09/2021), Disp: 2 mL, Rfl: 0   oxyCODONE (OXY IR/ROXICODONE) 5 MG immediate release tablet, Take 1-2 tablets (5-10 mg total) by mouth every 4 (four) hours as needed for moderate pain or severe pain. (Patient not taking: Reported on 04/09/2021), Disp: 30 tablet, Rfl: 0   pantoprazole (PROTONIX) 40 MG  injection, Inject 40 mg into the vein every 12 (twelve) hours. (Patient not taking: Reported on 04/09/2021), Disp: 1 each, Rfl:    polyethylene glycol (MIRALAX / GLYCOLAX) 17 g packet, Take 17 g by mouth daily as needed for moderate constipation. (Patient not taking: Reported on 04/09/2021), Disp: 14 each, Rfl: 0   sodium chloride 0.9 % SOLN 50 mL with promethazine 25 MG/ML SOLN 12.5 mg, Inject 12.5 mg into the vein every 6 (six) hours as needed. (Patient not taking: Reported on 04/09/2021), Disp: , Rfl:    traMADol (ULTRAM) 50 MG tablet, Take 50 mg by mouth at bedtime as needed for moderate pain. (Patient not taking: Reported on 04/09/2021), Disp: , Rfl:    Garner Nash, DO [facility] 04/09/2021 3:28 PM

## 2021-04-09 NOTE — Patient Instructions (Signed)
Thank you for visiting Dr. Valeta Harms at [facility]. Today we recommend the following:  Orders Placed This Encounter  Procedures   CT Super D Chest Wo Contrast   Repeat CT Chest in 1 year, can see me afterwards  Return in about 1 year (around 04/09/2022) for with APP or Dr. Valeta Harms.    Please do your part to reduce the spread of COVID-19.

## 2022-02-09 ENCOUNTER — Telehealth: Payer: Self-pay | Admitting: Pulmonary Disease

## 2022-02-10 NOTE — Telephone Encounter (Signed)
Kimberly Kelly please take care of this.

## 2022-02-15 IMAGING — DX DG CHEST 1V PORT
1 series · 1 of 1 positions shown · non-contrast
Comparison: 10/10/2016

CLINICAL DATA: Hypoxia, dyspnea

EXAM:
PORTABLE CHEST 1 VIEW

[chest ap]
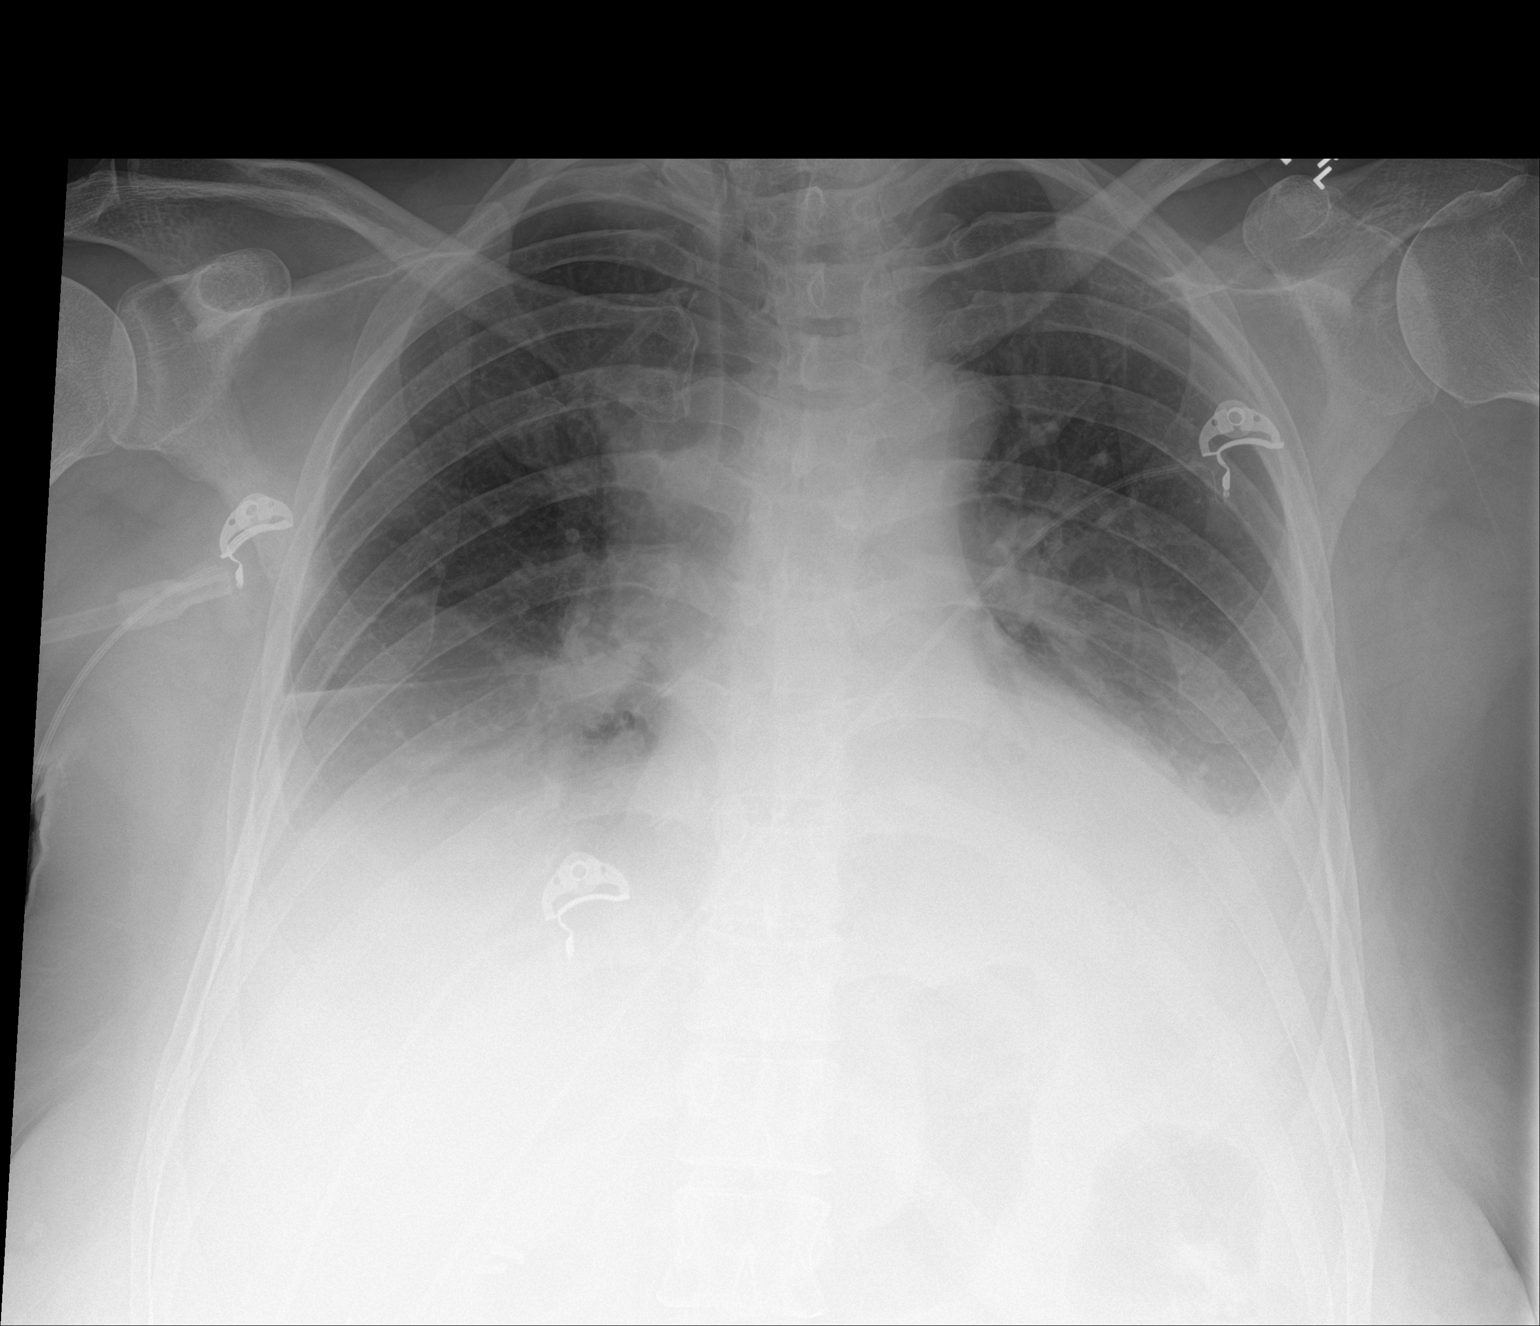

[1 of 1 positions shown; findings below may reference images not displayed]

FINDINGS: Small to moderate bilateral pleural effusions have developed with
associated bibasilar atelectasis. No superimposed focal pulmonary
infiltrate. No pneumothorax. Cardiac size is within normal limits.
Right suprahilar opacity likely relates to vascular shadow
accentuated by semi-erect positioning. Pulmonary vascularity is
normal. No acute bone abnormality.
IMPRESSION: Interval development of small to moderate bilateral pleural
effusions and associated bibasilar compressive atelectasis.

## 2022-02-16 ENCOUNTER — Other Ambulatory Visit: Payer: Self-pay | Admitting: Pulmonary Disease

## 2022-02-16 DIAGNOSIS — R918 Other nonspecific abnormal finding of lung field: Secondary | ICD-10-CM

## 2022-02-16 DIAGNOSIS — R911 Solitary pulmonary nodule: Secondary | ICD-10-CM

## 2022-02-16 NOTE — Telephone Encounter (Signed)
Ct scheduled and patient aware of appt

## 2022-02-16 NOTE — Progress Notes (Signed)
Order for CT per [facility] since other one would expire.

## 2022-02-19 IMAGING — DX DG CHEST 1V PORT
1 series · 1 of 1 positions shown · non-contrast
Comparison: January 09, 2021

CLINICAL DATA: Shortness of breath

EXAM:
PORTABLE CHEST 1 VIEW

[chest ap]
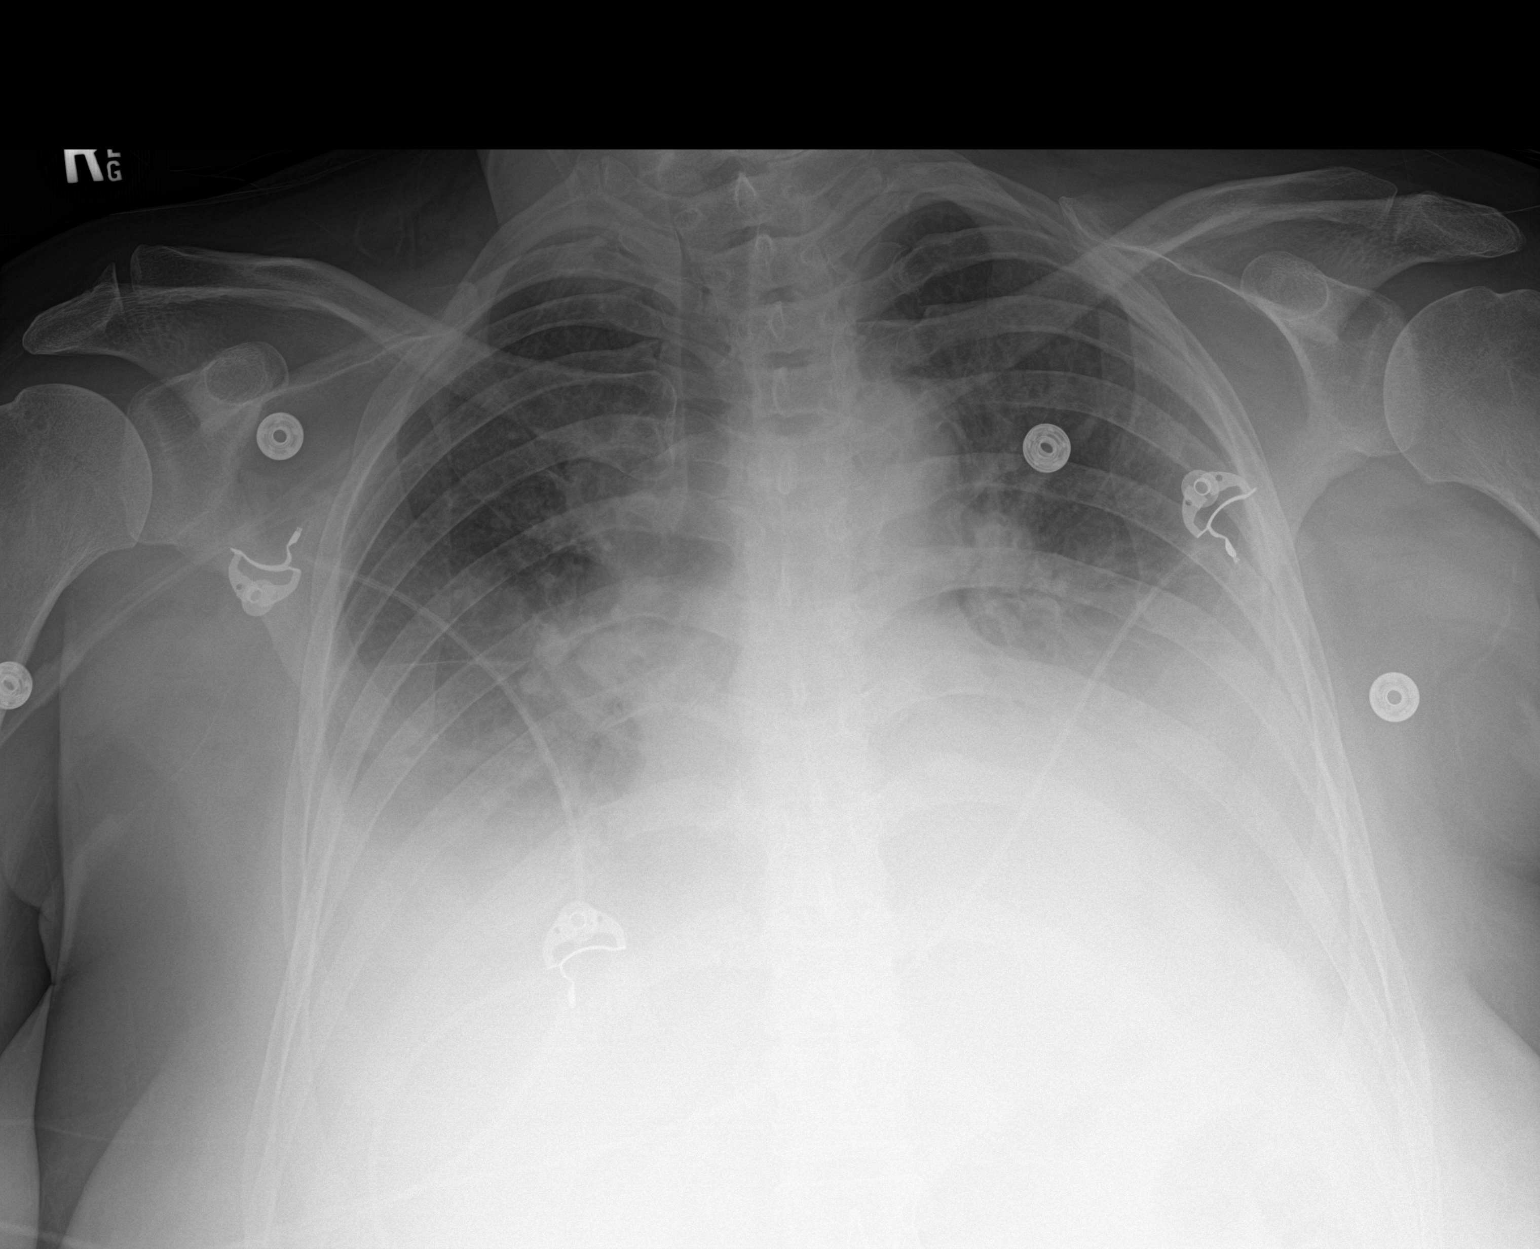

[1 of 1 positions shown; findings below may reference images not displayed]

FINDINGS: There are pleural effusions bilaterally with atelectasis and patchy
infiltrate in each lower lung region. Heart is borderline enlarged
with pulmonary vascularity normal. No evident adenopathy. No
pneumothorax. No bone lesions.
IMPRESSION: Pleural effusions bilaterally. Atelectasis and questionable
superimposed pneumonia in the lower lung regions. Stable cardiac
prominence.

## 2022-04-20 ENCOUNTER — Encounter (HOSPITAL_BASED_OUTPATIENT_CLINIC_OR_DEPARTMENT_OTHER): Payer: Self-pay

## 2022-04-20 ENCOUNTER — Ambulatory Visit (HOSPITAL_BASED_OUTPATIENT_CLINIC_OR_DEPARTMENT_OTHER)
Admission: RE | Admit: 2022-04-20 | Discharge: 2022-04-20 | Disposition: A | Discharge status: Home / Self Care | Payer: 59 | Source: Ambulatory Visit | Attending: Nurse Practitioner | Admitting: Nurse Practitioner

## 2022-04-20 DIAGNOSIS — R911 Solitary pulmonary nodule: Secondary | ICD-10-CM | POA: Diagnosis not present

## 2022-04-20 DIAGNOSIS — R918 Other nonspecific abnormal finding of lung field: Secondary | ICD-10-CM | POA: Diagnosis present

## 2022-04-27 ENCOUNTER — Ambulatory Visit: Payer: 59 | Admitting: Pulmonary Disease

## 2022-04-27 ENCOUNTER — Telehealth: Payer: Self-pay | Admitting: Pulmonary Disease

## 2022-04-27 ENCOUNTER — Encounter: Payer: Self-pay | Admitting: Pulmonary Disease

## 2022-04-27 VITALS — BP 120/80 | HR 84 | Ht 67.0 in | Wt 201.0 lb

## 2022-04-27 DIAGNOSIS — Z789 Other specified health status: Secondary | ICD-10-CM

## 2022-04-27 DIAGNOSIS — R911 Solitary pulmonary nodule: Secondary | ICD-10-CM | POA: Diagnosis not present

## 2022-04-27 DIAGNOSIS — R918 Other nonspecific abnormal finding of lung field: Secondary | ICD-10-CM | POA: Diagnosis not present

## 2022-04-27 NOTE — Patient Instructions (Addendum)
Thank you for visiting Dr. Valeta Harms at [facility]. Today we recommend the following:  Appt with Dr. Kipp Brood to discuss options  If you decide to follow up on this in 6-12 months we can push our appt out.   Return in about 3 months (around 07/27/2022), or if symptoms worsen or fail to improve, for w/ Dr. Valeta Harms .    Please do your part to reduce the spread of COVID-19.

## 2022-04-27 NOTE — Progress Notes (Signed)
Synopsis: Referred in February 2022 for lung nodule, groundglass opacity by [name], FNP  Subjective:   PATIENT ID: Kimberly Kelly GENDER: female DOB: 09-May-1971, MRN: [id number]  Chief Complaint  Patient presents with   Follow-up    This is a 51 year old female, past medical history of arthritis, and lifelong non-smoker, paternal grandfather with lung cancer however he was a smoker.  Patient had CT scan imaging that was completed at [facility].  CT scan report is available for review in epic care everywhere.  CT scan was completed on 08/01/2020 which revealed a nodular groundglass focus within the right lower lobe recommending CT follow-up she has known cystic disease within the breast that has routine mammograms.  From a respiratory standpoint patient has no complaints.  Patient's child goes to the [employer] school here in [place] and she knows Dr. Graylon Good from [place] who made recommendations for referral to see me here in clinic for follow-up of the abnormalities seen in the chest.  OV 12/04/2020: Patient here today for CT follow-up completed on 11/22/2020 patient has a persistent right lower lobe groundglass opacity 1.3 cm in size.  Also noted to have several cystic masses within the right breast on CT imaging. She has no respiratory complaints at this time. She is anxious about the persistence of the nodule.   OV 04/09/2021: Here today for follow-up after recent super D CT scan.  Followed in our clinic for a right lower lobe persistent 1.4 cm groundglass opacity.  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  She was seen by GI here as well as atrium at [facility].  She is doing much better after this event.  Slowly starting to recover.  Initially she had been referred to thoracic surgery for consideration of removal of this groundglass opacity or  considerations even for biopsy.  She wants to hold off on all of this for the time being.  I think this is reasonable.  OV 04/27/2022: Here today for follow-up after recent CT imaging.We have been following a groundglass opacity within the superior segment of the right lower lobe.  It is still present in comparison. The patients GGO is still present and relatively the same in size.  We talked about pros and cons of evaluation to consider biopsy or not.     Past Medical History:  Diagnosis Date   Arthritis      Family History  Problem Relation Age of Onset   Breast cancer Paternal Grandmother      Past Surgical History:  Procedure Laterality Date   BIOPSY  02/07/2021   Procedure: BIOPSY;  Surgeon: Mansouraty, Telford Nab., MD;  Location: [place];  Service: Gastroenterology;;   CESAREAN SECTION     CHOLECYSTECTOMY N/A 10/10/2016   Procedure: LAPAROSCOPIC CHOLECYSTECTOMY WITH INTRAOPERATIVE CHOLANGIOGRAM;  Surgeon: Erroll Luna, MD;  Location: [place];  Service: General;  Laterality: N/A;   ESOPHAGOGASTRODUODENOSCOPY (EGD) WITH PROPOFOL N/A 02/01/2021   Procedure: ESOPHAGOGASTRODUODENOSCOPY (EGD) WITH PROPOFOL;  Surgeon: Clarene Essex, MD;  Location: [place];  Service: Endoscopy;  Laterality: N/A;   ESOPHAGOGASTRODUODENOSCOPY (EGD) WITH PROPOFOL N/A 02/07/2021   Procedure: ESOPHAGOGASTRODUODENOSCOPY (EGD) WITH PROPOFOL;  Surgeon: Rush Landmark Telford Nab., MD;  Location: [place];  Service: Gastroenterology;  Laterality: N/A;   SHOULDER ARTHROSCOPY     TUBAL LIGATION     UPPER ESOPHAGEAL ENDOSCOPIC ULTRASOUND (EUS) Left 02/07/2021   Procedure: UPPER ESOPHAGEAL  ENDOSCOPIC ULTRASOUND (EUS);  Surgeon: Irving Copas., MD;  Location: [place];  Service: Gastroenterology;  Laterality: Left;    Social History   Socioeconomic History   Marital status: Married    Spouse name: Not on file   Number of children: Not on file   Years of education: Not on file   Highest education  level: Not on file  Occupational History   Not on file  Tobacco Use   Smoking status: Never   Smokeless tobacco: Never  Vaping Use   Vaping Use: Never used  Substance and Sexual Activity   Alcohol use: Yes    Alcohol/week: 7.0 standard drinks of alcohol    Types: 7 Glasses of wine per week   Drug use: No   Sexual activity: Not on file  Other Topics Concern   Not on file  Social History Narrative   Not on file   Social Determinants of Health   Financial Resource Strain: Not on file  Food Insecurity: Not on file  Transportation Needs: Not on file  Physical Activity: Not on file  Stress: Not on file  Social Connections: Not on file  Intimate Partner Violence: Not on file     No Known Allergies   Outpatient Medications Prior to Visit  Medication Sig Dispense Refill   escitalopram (LEXAPRO) 10 MG tablet Take 10 mg by mouth daily.     ALPRAZolam (XANAX) 0.5 MG tablet Take 0.5 mg by mouth at bedtime as needed for anxiety or sleep. (Patient not taking: Reported on 04/09/2021)     docusate sodium (COLACE) 100 MG capsule Take 1 capsule (100 mg total) by mouth 2 (two) times daily as needed for mild constipation. (Patient not taking: Reported on 04/09/2021) 10 capsule 0   ELIQUIS 5 MG TABS tablet Take by mouth. (Patient not taking: Reported on 04/27/2022)     enoxaparin (LOVENOX) 100 MG/ML injection Inject 0.9 mLs (90 mg total) into the skin every 12 (twelve) hours. (Patient not taking: Reported on 04/09/2021) 0 mL    feeding supplement (ENSURE ENLIVE / ENSURE PLUS) LIQD Take 237 mLs by mouth 2 (two) times daily between meals. (Patient not taking: Reported on 04/09/2021) 237 mL 12   meropenem 1 g in sodium chloride 0.9 % 100 mL Inject 1 g into the vein every 8 (eight) hours. (Patient not taking: Reported on 04/09/2021)     methocarbamol (ROBAXIN) 500 MG tablet Take 500 mg by mouth every 6 (six) hours as needed for muscle spasms. (Patient not taking: Reported on 04/27/2022)     morphine 2 MG/ML  injection Inject 0.5 mLs (1 mg total) into the vein every 2 (two) hours as needed. (Patient not taking: Reported on 04/09/2021) 1 mL 0   ondansetron (ZOFRAN) 4 MG/2ML SOLN injection Inject 2 mLs (4 mg total) into the vein every 6 (six) hours as needed for nausea. (Patient not taking: Reported on 04/09/2021) 2 mL 0   oxyCODONE (OXY IR/ROXICODONE) 5 MG immediate release tablet Take 1-2 tablets (5-10 mg total) by mouth every 4 (four) hours as needed for moderate pain or severe pain. (Patient not taking: Reported on 04/09/2021) 30 tablet 0   pantoprazole (PROTONIX) 40 MG injection Inject 40 mg into the vein every 12 (twelve) hours. (Patient not taking: Reported on 04/09/2021) 1 each    polyethylene glycol (MIRALAX / GLYCOLAX) 17 g packet Take 17 g by mouth daily as needed for moderate constipation. (Patient not taking: Reported on 04/09/2021) 14 each 0   simethicone (  MYLICON) 80 MG chewable tablet Chew 1 tablet (80 mg total) by mouth every 6 (six) hours as needed for flatulence. (Patient not taking: Reported on 04/27/2022) 30 tablet 0   sodium chloride 0.9 % SOLN 50 mL with promethazine 25 MG/ML SOLN 12.5 mg Inject 12.5 mg into the vein every 6 (six) hours as needed. (Patient not taking: Reported on 04/09/2021)     traMADol (ULTRAM) 50 MG tablet Take 50 mg by mouth at bedtime as needed for moderate pain. (Patient not taking: Reported on 04/09/2021)     No facility-administered medications prior to visit.    Review of Systems  Constitutional:  Negative for chills, fever, malaise/fatigue and weight loss.  HENT:  Negative for hearing loss, sore throat and tinnitus.   Eyes:  Negative for blurred vision and double vision.  Respiratory:  Negative for cough, hemoptysis, sputum production, shortness of breath, wheezing and stridor.   Cardiovascular:  Negative for chest pain, palpitations, orthopnea, leg swelling and PND.  Gastrointestinal:  Positive for abdominal pain. Negative for constipation, diarrhea, heartburn, nausea  and vomiting.       Occasional bloating  Genitourinary:  Negative for dysuria, hematuria and urgency.  Musculoskeletal:  Negative for joint pain and myalgias.  Skin:  Negative for itching and rash.  Neurological:  Negative for dizziness, tingling, weakness and headaches.  Endo/Heme/Allergies:  Negative for environmental allergies. Does not bruise/bleed easily.  Psychiatric/Behavioral:  Negative for depression. The patient is not nervous/anxious and does not have insomnia.   All other systems reviewed and are negative.    Objective:  Physical Exam Vitals reviewed.  Constitutional:      General: She is not in acute distress.    Appearance: She is well-developed.  HENT:     Head: Normocephalic and atraumatic.  Eyes:     General: No scleral icterus.    Conjunctiva/sclera: Conjunctivae normal.     Pupils: Pupils are equal, round, and reactive to light.  Neck:     Vascular: No JVD.     Trachea: No tracheal deviation.  Cardiovascular:     Rate and Rhythm: Normal rate and regular rhythm.     Heart sounds: Normal heart sounds. No murmur heard. Pulmonary:     Effort: Pulmonary effort is normal. No tachypnea, accessory muscle usage or respiratory distress.     Breath sounds: No stridor. No wheezing, rhonchi or rales.  Abdominal:     General: There is no distension.     Palpations: Abdomen is soft.     Tenderness: There is no abdominal tenderness.  Musculoskeletal:        General: No tenderness.     Cervical back: Neck supple.  Lymphadenopathy:     Cervical: No cervical adenopathy.  Skin:    General: Skin is warm and dry.     Capillary Refill: Capillary refill takes less than 2 seconds.     Findings: No rash.  Neurological:     Mental Status: She is alert and oriented to person, place, and time.  Psychiatric:        Behavior: Behavior normal.      Vitals:   04/27/22 0958  BP: 120/80  Pulse: 84  SpO2: 97%  Weight: 201 lb (91.2 kg)  Height: 5\' 7"  (1.702 m)   97% on  RA BMI Readings from Last 3 Encounters:  04/27/22 31.48 kg/m  04/09/21 26.94 kg/m  02/07/21 30.21 kg/m   Wt Readings from Last 3 Encounters:  04/27/22 201 lb (91.2 kg)  04/09/21 172  lb (78 kg)  02/07/21 192 lb 14.4 oz (87.5 kg)     CBC    Component Value Date/Time   WBC 8.1 02/11/2021 0403   RBC 3.33 (L) 02/11/2021 0403   HGB 9.3 (L) 02/11/2021 0403   HCT 29.3 (L) 02/11/2021 0403   PLT 352 02/11/2021 0403   MCV 88.0 02/11/2021 0403   MCH 27.9 02/11/2021 0403   MCHC 31.7 02/11/2021 0403   RDW 13.3 02/11/2021 0403   LYMPHSABS 0.8 02/01/2021 0201   MONOABS 0.5 02/01/2021 0201   EOSABS 0.0 02/01/2021 0201   BASOSABS 0.0 02/01/2021 0201    Chest Imaging: CT chest 08/01/2020: 14 mm groundglass nodule within the right lower lobe. The patient's images have been independently reviewed by me.    Pulmonary Functions Testing Results:    Latest Ref Rng & Units 12/27/2020   10:46 AM  PFT Results  FVC-Pre L 3.55   FVC-Predicted Pre % 90   FVC-Post L 3.53   FVC-Predicted Post % 90   Pre FEV1/FVC % % 80   Post FEV1/FCV % % 83   FEV1-Pre L 2.84   FEV1-Predicted Pre % 91   FEV1-Post L 2.92   DLCO uncorrected ml/min/mmHg 31.21   DLCO UNC% % 134   DLCO corrected ml/min/mmHg 31.21   DLCO COR %Predicted % 134   DLVA Predicted % 130   TLC L 5.84   TLC % Predicted % 106   RV % Predicted % 110     FeNO:   Pathology:   Echocardiogram:   Heart Catheterization:     Assessment & Plan:     ICD-10-CM   1. Lung nodule  R91.1 Ambulatory referral to Cardiothoracic Surgery    2. Ground glass opacity present on imaging of lung  R91.8 Ambulatory referral to Cardiothoracic Surgery    3. Non-smoker  Z78.9        Discussion:  This is a 51 year old female, no real significant past medical history except for recent abdominal issues related to pancreas.  She is doing better now from this.  We decided to not do anything with the groundglass nodule last year and she has had  subsequent follow-up. This still shows stability of a 14 mm groundglass lesion in the right lower lobe.  Plan: I think the next best step for her will be to meet with one of the cardiothoracic surgeons to discuss removal. I think that she may be a good candidate for single anesthetic event if she really wants to have this thing taken out. If you want to do it in a stepwise fashion we could always consider biopsy. Persistent groundglass lesions do carry concern for underlying neoplasm.  Especially in a non-smoking female. Patient is understanding of this plan and we will place referral to CT surgery.  Pending upon her discussion with them we will decide follow-up after that. Tentative, RTC in 3 months.   Current Outpatient Medications:    escitalopram (LEXAPRO) 10 MG tablet, Take 10 mg by mouth daily., Disp: , Rfl:    ALPRAZolam (XANAX) 0.5 MG tablet, Take 0.5 mg by mouth at bedtime as needed for anxiety or sleep. (Patient not taking: Reported on 04/09/2021), Disp: , Rfl:    docusate sodium (COLACE) 100 MG capsule, Take 1 capsule (100 mg total) by mouth 2 (two) times daily as needed for mild constipation. (Patient not taking: Reported on 04/09/2021), Disp: 10 capsule, Rfl: 0   ELIQUIS 5 MG TABS tablet, Take by mouth. (Patient not taking: Reported  on 04/27/2022), Disp: , Rfl:    enoxaparin (LOVENOX) 100 MG/ML injection, Inject 0.9 mLs (90 mg total) into the skin every 12 (twelve) hours. (Patient not taking: Reported on 04/09/2021), Disp: 0 mL, Rfl:    feeding supplement (ENSURE ENLIVE / ENSURE PLUS) LIQD, Take 237 mLs by mouth 2 (two) times daily between meals. (Patient not taking: Reported on 04/09/2021), Disp: 237 mL, Rfl: 12   meropenem 1 g in sodium chloride 0.9 % 100 mL, Inject 1 g into the vein every 8 (eight) hours. (Patient not taking: Reported on 04/09/2021), Disp: , Rfl:    methocarbamol (ROBAXIN) 500 MG tablet, Take 500 mg by mouth every 6 (six) hours as needed for muscle spasms. (Patient not  taking: Reported on 04/27/2022), Disp: , Rfl:    morphine 2 MG/ML injection, Inject 0.5 mLs (1 mg total) into the vein every 2 (two) hours as needed. (Patient not taking: Reported on 04/09/2021), Disp: 1 mL, Rfl: 0   ondansetron (ZOFRAN) 4 MG/2ML SOLN injection, Inject 2 mLs (4 mg total) into the vein every 6 (six) hours as needed for nausea. (Patient not taking: Reported on 04/09/2021), Disp: 2 mL, Rfl: 0   oxyCODONE (OXY IR/ROXICODONE) 5 MG immediate release tablet, Take 1-2 tablets (5-10 mg total) by mouth every 4 (four) hours as needed for moderate pain or severe pain. (Patient not taking: Reported on 04/09/2021), Disp: 30 tablet, Rfl: 0   pantoprazole (PROTONIX) 40 MG injection, Inject 40 mg into the vein every 12 (twelve) hours. (Patient not taking: Reported on 04/09/2021), Disp: 1 each, Rfl:    polyethylene glycol (MIRALAX / GLYCOLAX) 17 g packet, Take 17 g by mouth daily as needed for moderate constipation. (Patient not taking: Reported on 04/09/2021), Disp: 14 each, Rfl: 0   simethicone (MYLICON) 80 MG chewable tablet, Chew 1 tablet (80 mg total) by mouth every 6 (six) hours as needed for flatulence. (Patient not taking: Reported on 04/27/2022), Disp: 30 tablet, Rfl: 0   sodium chloride 0.9 % SOLN 50 mL with promethazine 25 MG/ML SOLN 12.5 mg, Inject 12.5 mg into the vein every 6 (six) hours as needed. (Patient not taking: Reported on 04/09/2021), Disp: , Rfl:    traMADol (ULTRAM) 50 MG tablet, Take 50 mg by mouth at bedtime as needed for moderate pain. (Patient not taking: Reported on 04/09/2021), Disp: , Rfl:    Garner Nash, DO [facility] 04/27/2022 10:27 AM

## 2022-04-28 NOTE — Telephone Encounter (Signed)
Spoke with pt and answered questions she had regarding Bronch procedure recovery time and time it would take to get results. Pt verbalized understanding and had no further questions. Pt to see Dr Kipp Brood for consultation on 05/01/22.

## 2022-04-30 NOTE — Progress Notes (Signed)
[address]       [place] [address]             [id number]                    Baili J Erisman Trumbull Medical Record #[id number] Date of Birth: [birth year]  Referring: Garner Nash, DO Primary Care: [name], [place] Primary Cardiologist: None  Chief Complaint:    Chief Complaint  Patient presents with   Lung Lesion    Surgical consult/ Chest CT 04/20/22 and 03/31/22/PFT's 12/27/20    History of Present Illness:    Kimberly Kelly 51 y.o. female referred for surgical evaluation of a right lower lobe pulmonary nodule.  Has been followed for over a year, and there has been some change noted within the solid component of it.  She is a lifelong non-smoker.  This was originally identified when she was being worked up for gallstone pancreatitis.  She since has recovered from that after having extensive hospitalization.  She also has a history of a cystic lesion in her breast which has been followed with routine mammograms.  She denies any neurologic symptoms or shortness of breath.  Her weight has been stable.  She works out regularly.     Zubrod Score: At the time of surgery this patient's most appropriate activity status/level should be described as: [x]     0    Normal activity, no symptoms []     1    Restricted in physical strenuous activity but ambulatory, able to do out light work []     2    Ambulatory and capable of self care, unable to do work activities, up and about               >50 % of waking hours                              []     3    Only limited self care, in bed greater than 50% of waking hours []     4    Completely disabled, no self care, confined to bed or chair []     5    Moribund   Past Medical History:  Diagnosis Date   Arthritis     Past Surgical History:  Procedure Laterality Date   BIOPSY  02/07/2021   Procedure: BIOPSY;  Surgeon: Rush Landmark, Telford Nab., MD;  Location: [facility];  Service: Gastroenterology;;   CESAREAN  SECTION     CHOLECYSTECTOMY N/A 10/10/2016   Procedure: LAPAROSCOPIC CHOLECYSTECTOMY WITH INTRAOPERATIVE CHOLANGIOGRAM;  Surgeon: Erroll Luna, MD;  Location: [facility];  Service: General;  Laterality: N/A;   ESOPHAGOGASTRODUODENOSCOPY (EGD) WITH PROPOFOL N/A 02/01/2021   Procedure: ESOPHAGOGASTRODUODENOSCOPY (EGD) WITH PROPOFOL;  Surgeon: Clarene Essex, MD;  Location: [facility];  Service: Endoscopy;  Laterality: N/A;   ESOPHAGOGASTRODUODENOSCOPY (EGD) WITH PROPOFOL N/A 02/07/2021   Procedure: ESOPHAGOGASTRODUODENOSCOPY (EGD) WITH PROPOFOL;  Surgeon: Rush Landmark Telford Nab., MD;  Location: [facility];  Service: Gastroenterology;  Laterality: N/A;   SHOULDER ARTHROSCOPY     TUBAL LIGATION     UPPER ESOPHAGEAL ENDOSCOPIC ULTRASOUND (EUS) Left 02/07/2021   Procedure: UPPER ESOPHAGEAL ENDOSCOPIC ULTRASOUND (EUS);  Surgeon: Irving Copas., MD;  Location: [facility];  Service: Gastroenterology;  Laterality: Left;    Family History  Problem Relation Age of Onset   Breast cancer Paternal Grandmother  Social History   Tobacco Use  Smoking Status Never  Smokeless Tobacco Never    Social History   Substance and Sexual Activity  Alcohol Use Yes   Alcohol/week: 7.0 standard drinks of alcohol   Types: 7 Glasses of wine per week     No Known Allergies  Current Outpatient Medications  Medication Sig Dispense Refill   escitalopram (LEXAPRO) 10 MG tablet Take 10 mg by mouth daily.     ALPRAZolam (XANAX) 0.5 MG tablet Take 0.5 mg by mouth at bedtime as needed for anxiety or sleep.     docusate sodium (COLACE) 100 MG capsule Take 1 capsule (100 mg total) by mouth 2 (two) times daily as needed for mild constipation. 10 capsule 0   ELIQUIS 5 MG TABS tablet Take by mouth.     enoxaparin (LOVENOX) 100 MG/ML injection Inject 0.9 mLs (90 mg total) into the skin every 12 (twelve) hours. 0 mL    feeding supplement (ENSURE ENLIVE / ENSURE PLUS) LIQD Take 237 mLs by mouth 2 (two) times daily  between meals. 237 mL 12   meropenem 1 g in sodium chloride 0.9 % 100 mL Inject 1 g into the vein every 8 (eight) hours.     methocarbamol (ROBAXIN) 500 MG tablet Take 500 mg by mouth every 6 (six) hours as needed for muscle spasms.     morphine 2 MG/ML injection Inject 0.5 mLs (1 mg total) into the vein every 2 (two) hours as needed. 1 mL 0   ondansetron (ZOFRAN) 4 MG/2ML SOLN injection Inject 2 mLs (4 mg total) into the vein every 6 (six) hours as needed for nausea. 2 mL 0   oxyCODONE (OXY IR/ROXICODONE) 5 MG immediate release tablet Take 1-2 tablets (5-10 mg total) by mouth every 4 (four) hours as needed for moderate pain or severe pain. 30 tablet 0   pantoprazole (PROTONIX) 40 MG injection Inject 40 mg into the vein every 12 (twelve) hours. 1 each    polyethylene glycol (MIRALAX / GLYCOLAX) 17 g packet Take 17 g by mouth daily as needed for moderate constipation. 14 each 0   simethicone (MYLICON) 80 MG chewable tablet Chew 1 tablet (80 mg total) by mouth every 6 (six) hours as needed for flatulence. 30 tablet 0   sodium chloride 0.9 % SOLN 50 mL with promethazine 25 MG/ML SOLN 12.5 mg Inject 12.5 mg into the vein every 6 (six) hours as needed.     traMADol (ULTRAM) 50 MG tablet Take 50 mg by mouth at bedtime as needed for moderate pain.     No current facility-administered medications for this visit.    Review of Systems  Constitutional:  Negative for fever, malaise/fatigue and weight loss.  Respiratory:  Negative for shortness of breath.   Cardiovascular:  Negative for chest pain.  Neurological: Negative.      PHYSICAL EXAMINATION: BP (!) 140/89   Pulse 72   Resp 20   Ht 5\' 7"  (1.702 m)   Wt 192 lb (87.1 kg)   LMP 02/14/2020   SpO2 98% Comment: RA  BMI 30.07 kg/m  Physical Exam Constitutional:      General: She is not in acute distress.    Appearance: She is normal weight. She is not ill-appearing.  HENT:     Head: Normocephalic and atraumatic.  Eyes:     Extraocular  Movements: Extraocular movements intact.  Cardiovascular:     Rate and Rhythm: Normal rate.  Pulmonary:     Effort: Pulmonary  effort is normal. No respiratory distress.  Abdominal:     General: Abdomen is flat. There is no distension.  Musculoskeletal:        General: Normal range of motion.     Cervical back: Normal range of motion.  Skin:    General: Skin is warm and dry.  Neurological:     General: No focal deficit present.     Mental Status: She is alert and oriented to person, place, and time.     Diagnostic Studies & Laboratory data:     Recent Radiology Findings:   CT Super D Chest Wo Contrast  Result Date: 04/20/2022 CLINICAL DATA:  Follow-up ground-glass pulmonary nodule. EXAM: CT CHEST WITHOUT CONTRAST TECHNIQUE: Multidetector CT imaging of the chest was performed using thin slice collimation for electromagnetic bronchoscopy planning purposes, without intravenous contrast. RADIATION DOSE REDUCTION: This exam was performed according to the departmental dose-optimization program which includes automated exposure control, adjustment of the mA and/or kV according to patient size and/or use of iterative reconstruction technique. COMPARISON:  03/31/2021 chest CT. FINDINGS: Cardiovascular: Normal heart size. No significant pericardial effusion/thickening. Great vessels are normal in course and caliber. Mediastinum/Nodes: No significant thyroid nodules. Unremarkable esophagus. No pathologically enlarged axillary, mediastinal or hilar lymph nodes, noting limited sensitivity for the detection of hilar adenopathy on this noncontrast study. Triangular focus of soft tissue and interspersed speckled fat density in the anterior mediastinum, compatible with mild thymic hyperplasia, increased. Calcified left hilar nonenlarged nodes are unchanged and compatible with prior granulomatous disease. Lungs/Pleura: No pneumothorax. No pleural effusion. Ground-glass superior segment right lower lobe 1.4 x 1.5  x 1 cm pulmonary nodule (series 4/image 75), previously 1.4 x 1.1 cm on 03/31/2021 and 11/22/2020 chest CT studies using similar measurement technique, stable. Lobulated peripherally calcified 1.2 cm solid anterior left lower lobe pulmonary nodule (series 4/image 112), previously 1.2 cm, stable, considered benign. No acute consolidative airspace disease or new significant pulmonary nodules. Upper abdomen: Partial visualization of a pigtail stent in the upper abdomen in the region of the distal stomach in the midline. Musculoskeletal: No aggressive appearing focal osseous lesions. Minimal thoracic spondylosis. IMPRESSION: 1. Ground-glass 1.4 cm superior segment right lower lobe pulmonary nodule is stable since 11/22/2020 baseline chest CT. Noncontrast chest CT follow-up recommended every 2 years until 5 year stability demonstrated. This recommendation follows the consensus statement: Guidelines for Management of Incidental Pulmonary Nodules Detected on CT Images: From the Fleischner Society 2017; Radiology 2017; 284:228-243. 2. Mild thymic hyperplasia, increased. 3. No thoracic adenopathy. Electronically Signed   By: Ilona Sorrel M.D.   On: 04/20/2022 21:19       I have independently reviewed the above radiology studies  and reviewed the findings with the patient.   Recent Lab Findings: Lab Results  Component Value Date   WBC 8.1 02/11/2021   HGB 9.3 (L) 02/11/2021   HCT 29.3 (L) 02/11/2021   PLT 352 02/11/2021   GLUCOSE 124 (H) 02/11/2021   ALT 18 02/04/2021   AST 12 (L) 02/04/2021   NA 140 02/11/2021   K 3.8 02/11/2021   CL 103 02/11/2021   CREATININE 0.84 02/11/2021   BUN 5 (L) 02/11/2021   CO2 32 02/11/2021   TSH 3.284 01/18/2021   INR 1.2 02/11/2021   HGBA1C 5.3 01/15/2021     PFTs:  - FVC: 90% - FEV1: 91% -DLCO: 134%     Lungs/Pleura: No pneumothorax. No pleural effusion. Ground-glass superior segment right lower lobe 1.4 x 1.5 x 1 cm pulmonary  nodule (series 4/image 75),  previously 1.4 x 1.1 cm on 03/31/2021 and 11/22/2020 chest CT studies using similar measurement technique, stable. Lobulated peripherally calcified 1.2 cm solid anterior left lower lobe pulmonary nodule (series 4/image 112), previously 1.2 cm, stable, considered benign. No acute consolidative airspace disease or new significant pulmonary nodules. Assessment / Plan:   51 year old female with a 1.4 cm right lower lobe pulmonary nodule.  This has been stable for over a year, however there have been some changes noted within the solid component on the.  I recommended that she undergo a PET/CT, and we talked about the risks and benefits of navigational bronchoscopy with biopsy followed by surgical resection if this is a cancer.  Regardless of the biopsy results she would like this removed that she has also consented to a right robotic assisted thoracoscopy with right lower lobe wedge and possible lobectomy.  I will coordinate timing with Dr. Valeta Harms.  Given her history I do not think that she requires a stress test.     I  spent 50minutes with  the patient face to face in counseling and coordination of care.    Lajuana Matte 05/01/2022 12:20 PM

## 2022-05-01 ENCOUNTER — Other Ambulatory Visit: Payer: Self-pay | Admitting: Thoracic Surgery (Cardiothoracic Vascular Surgery)

## 2022-05-01 ENCOUNTER — Institutional Professional Consult (permissible substitution): Payer: 59 | Admitting: Thoracic Surgery (Cardiothoracic Vascular Surgery)

## 2022-05-01 VITALS — BP 140/89 | HR 72 | Resp 20 | Ht 67.0 in | Wt 192.0 lb

## 2022-05-01 DIAGNOSIS — R911 Solitary pulmonary nodule: Secondary | ICD-10-CM | POA: Diagnosis not present

## 2022-05-04 ENCOUNTER — Other Ambulatory Visit: Payer: Self-pay | Admitting: *Deleted

## 2022-05-04 DIAGNOSIS — R911 Solitary pulmonary nodule: Secondary | ICD-10-CM

## 2022-05-05 ENCOUNTER — Encounter: Payer: Self-pay | Admitting: *Deleted

## 2022-05-06 ENCOUNTER — Other Ambulatory Visit: Payer: Self-pay | Admitting: *Deleted

## 2022-05-07 IMAGING — CT CT CHEST SUPER D W/O CM
2 of 5 series · 15 of 36 positions shown, 18 images · non-contrast
Comparison: Chest CT 11/22/2020.

CLINICAL DATA: 50-year-old female with history of ground-glass
lesion in the lungs noted on prior CT examination. Follow-up study.

EXAM:
CT CHEST WITHOUT CONTRAST
TECHNIQUE: Multidetector CT imaging of the chest was performed using thin slice
collimation for electromagnetic bronchoscopy planning purposes,
without intravenous contrast.

[Series 4: thins · axial · 0.71mm/px · z∈[+1164,+1434]mm · 12 of 392 slices shown, 15 images]
[im 27/392  mediastinal]
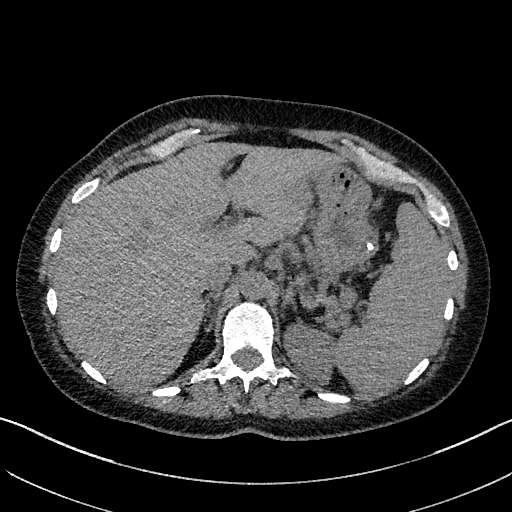
[im 27/392  lung]
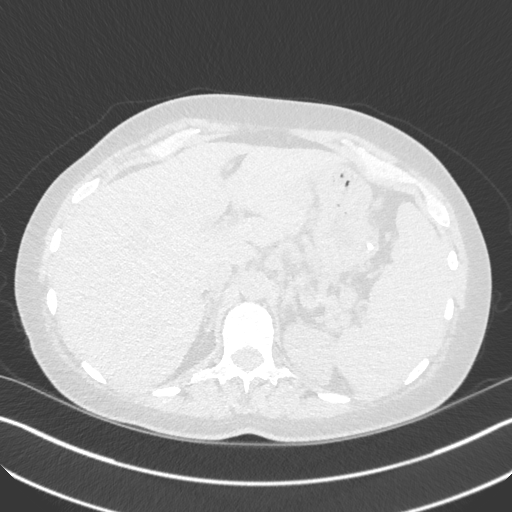
[im 53/392  lung]
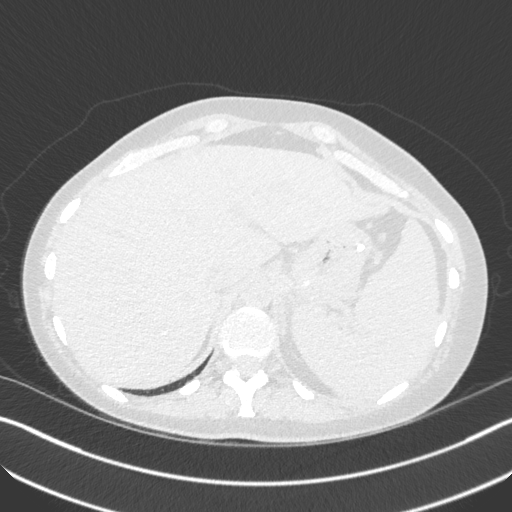
[im 79/392  lung]
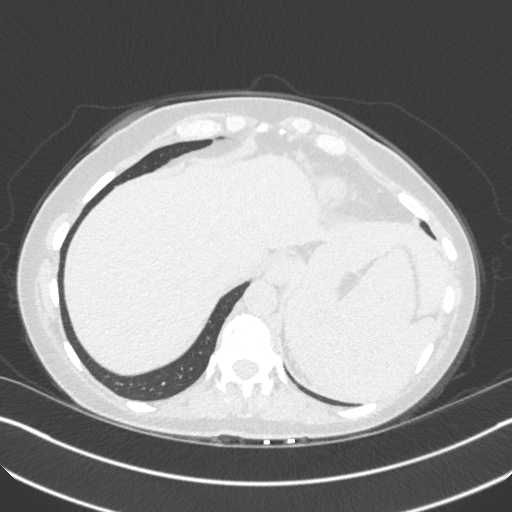
[im 131/392  lung]
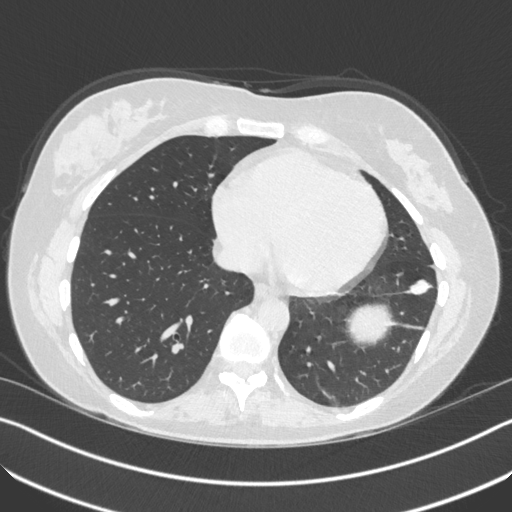
[im 157/392  mediastinal]
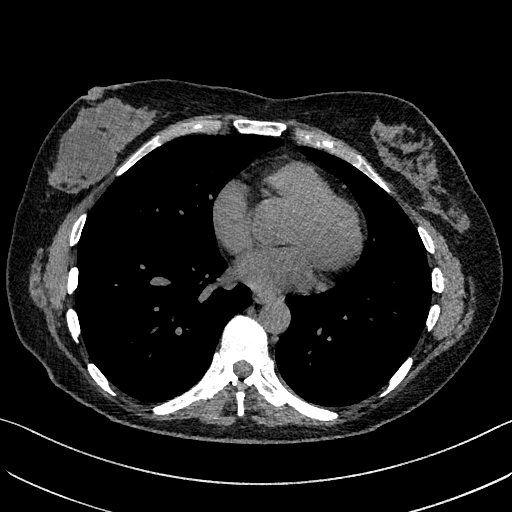
[im 157/392  lung]
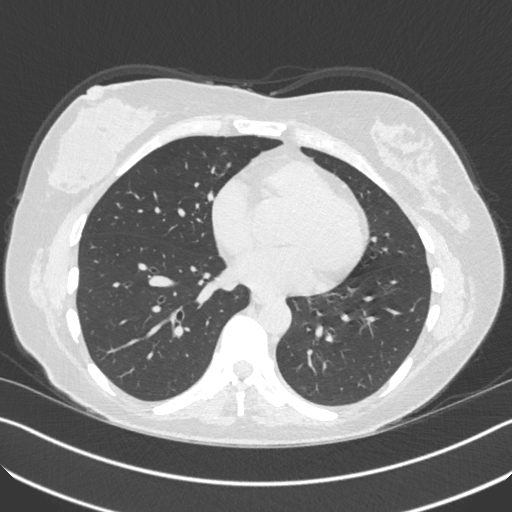
[im 183/392  lung]
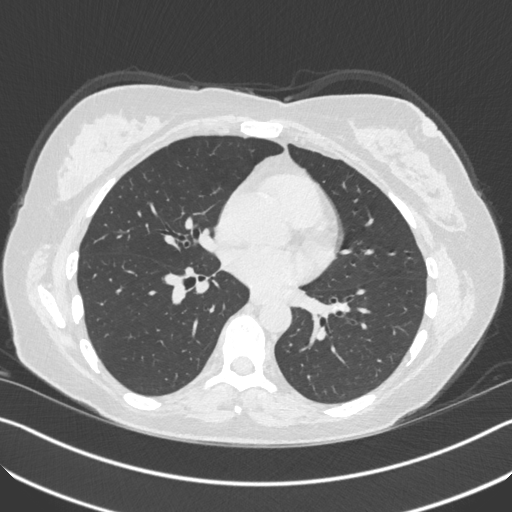
[im 209/392  lung]
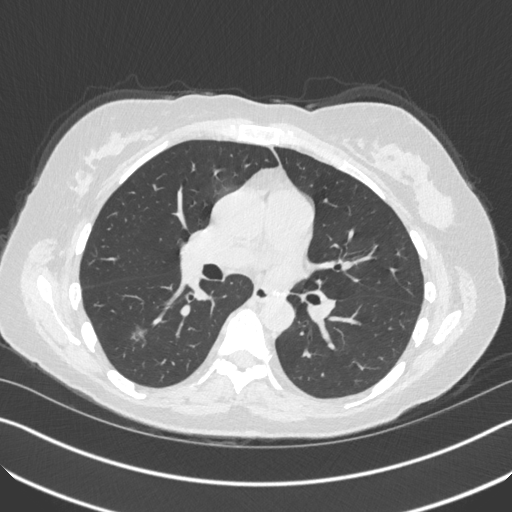
[im 235/392  lung]
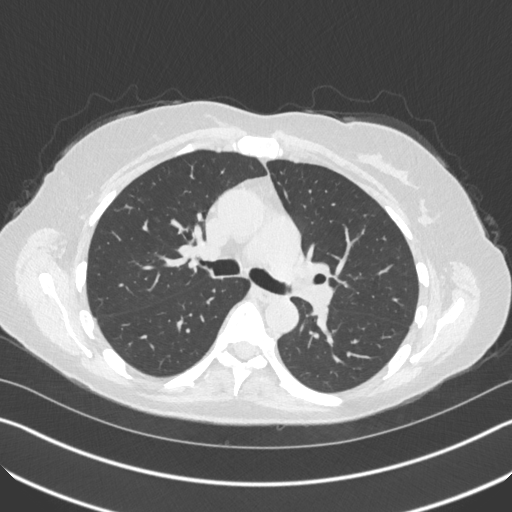
[im 261/392  mediastinal]
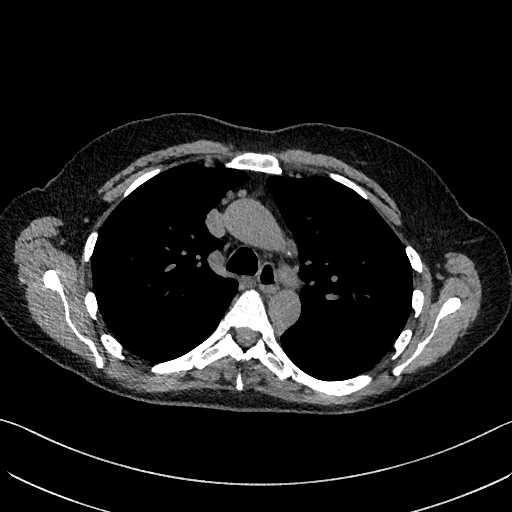
[im 261/392  lung]
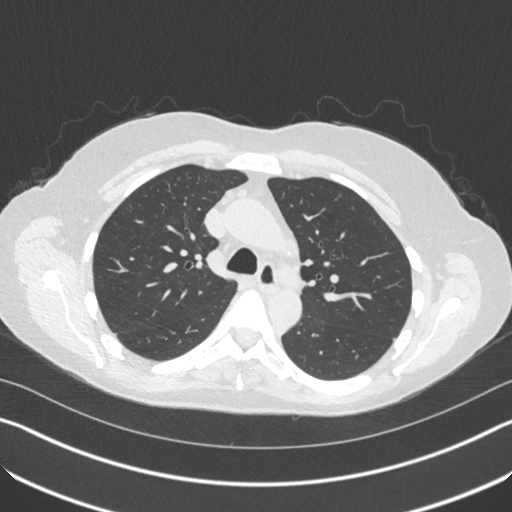
[im 313/392  lung]
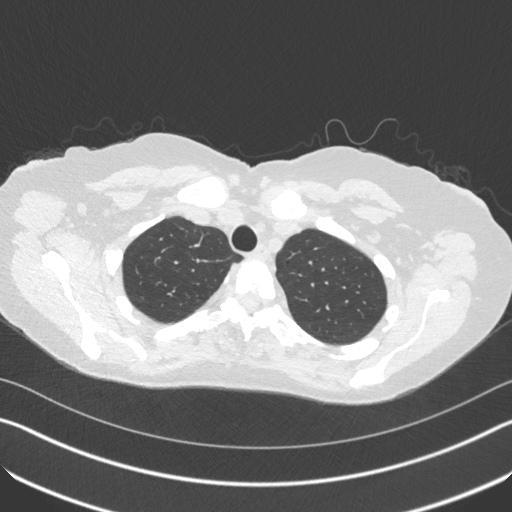
[im 339/392  lung]
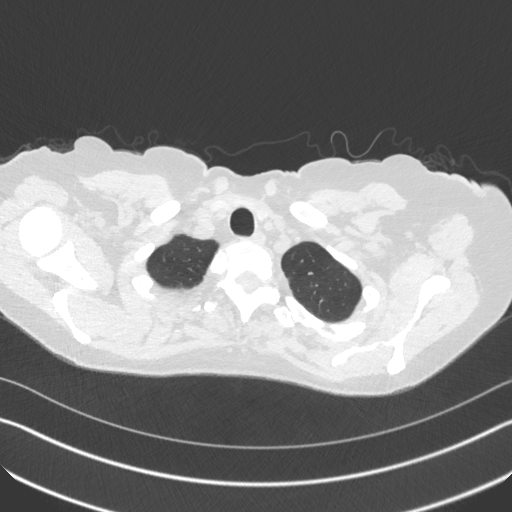
[im 365/392  lung]
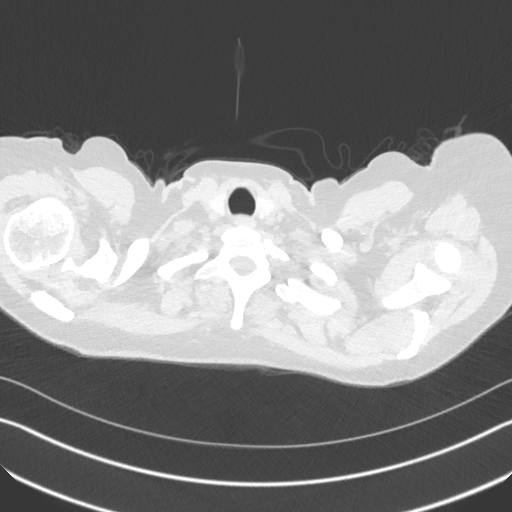

[Series 5: coronal · coronal · 0.61mm/px · 3 of 85 slices shown]
[im 17/85  lung]
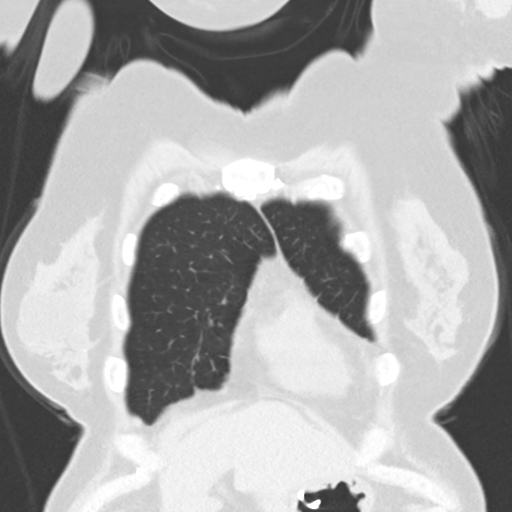
[im 34/85  lung]
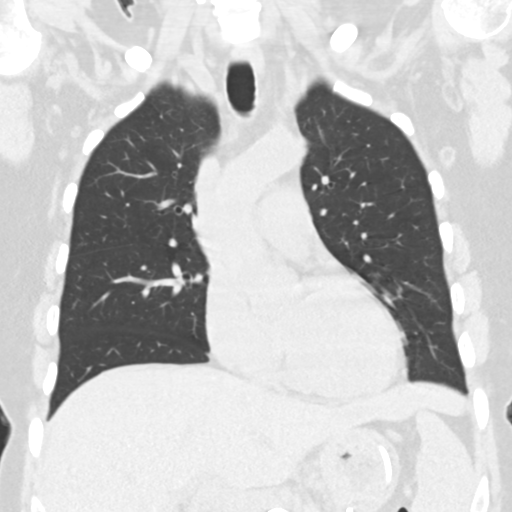
[im 51/85  lung]
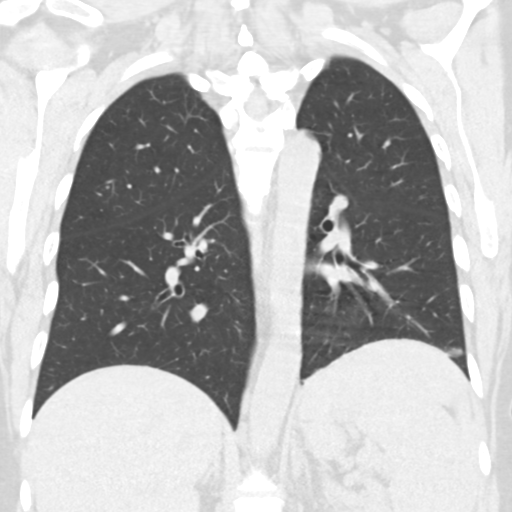

[15 of 36 positions shown; findings below may reference images not displayed]

FINDINGS: Cardiovascular: Heart size is normal. There is no significant
pericardial fluid, thickening or pericardial calcification. Aortic
atherosclerosis. No definite coronary artery calcifications.

Mediastinum/Nodes: No pathologically enlarged mediastinal or hilar
lymph nodes. Please note that accurate exclusion of hilar adenopathy
is limited on noncontrast CT scans. Esophagus is unremarkable in
appearance. No axillary lymphadenopathy.

Lungs/Pleura: In the superior segment of the right lower lobe (axial
image 71 of series 3 and coronal image 60 of series 5) there is a
well-defined 1.4 x 1.0 x 1.4 cm ground-glass attenuation nodule
which appears essentially stable in size compared to the prior
examination. Partially calcified pulmonary nodule in the left lower
lobe (axial image 105 of series 3) measuring 1.3 x 0.9 cm, similar
to the prior study, favored to be benign (potentially a bronchocele
with some calcifications, or less likely a lesion such as a small
hamartoma. No other definite new suspicious appearing pulmonary
nodules or masses are noted. No acute consolidative airspace
disease. No pleural effusions.

Upper Abdomen: There is a drainage catheter in the upper abdomen
which appears to be within the lumen of the stomach, with a proximal
pigtail which may be within a completely decompressed portion of
what used to be a large pancreatic pseudocyst (this is poorly
evaluated and incompletely imaged on today's noncontrast CT
examination).

Musculoskeletal: There are again multiple well-circumscribed
low-attenuation lesions in the right breast, similar in number and
size to the prior study, likely to represent cysts, largest of which
measures up to 3.8 x 2.8 cm (axial image 95 of series 2). There are
no aggressive appearing lytic or blastic lesions noted in the
visualized portions of the skeleton.
IMPRESSION: 1. Previously noted ground-glass attenuation nodule in the right
lower lobe is essentially stable in size and appearance measuring
1.4 x 1.0 x 1.4 cm. Repeat noncontrast chest CT is recommended in 1
year to ensure continued stability.
2. Aortic atherosclerosis.
3. Additional incidental findings, as above.

Aortic Atherosclerosis ([id number]-L38.8).

## 2022-05-13 ENCOUNTER — Encounter (HOSPITAL_COMMUNITY)
Admission: RE | Admit: 2022-05-13 | Discharge: 2022-05-13 | Disposition: A | Discharge status: Home / Self Care | Payer: 59 | Source: Ambulatory Visit | Attending: Thoracic Surgery (Cardiothoracic Vascular Surgery) | Admitting: Thoracic Surgery (Cardiothoracic Vascular Surgery)

## 2022-05-13 DIAGNOSIS — R911 Solitary pulmonary nodule: Secondary | ICD-10-CM | POA: Insufficient documentation

## 2022-05-13 LAB — GLUCOSE, CAPILLARY: Glucose-Capillary: 134 mg/dL — ABNORMAL HIGH (ref 70–99)

## 2022-05-13 MED ORDER — FLUDEOXYGLUCOSE F - 18 (FDG) INJECTION
9.3400 | Freq: Once | INTRAVENOUS | Status: AC | PRN
  Administered 2022-05-13: 9.34 via INTRAVENOUS

## 2022-06-03 ENCOUNTER — Ambulatory Visit: Payer: 59 | Admitting: Thoracic Surgery (Cardiothoracic Vascular Surgery)

## 2022-06-03 VITALS — BP 137/85 | HR 98 | Resp 18 | Ht 67.0 in | Wt 192.0 lb

## 2022-06-03 DIAGNOSIS — R911 Solitary pulmonary nodule: Secondary | ICD-10-CM

## 2022-06-04 ENCOUNTER — Ambulatory Visit: Payer: 59 | Admitting: Thoracic Surgery (Cardiothoracic Vascular Surgery)

## 2022-06-04 ENCOUNTER — Other Ambulatory Visit: Payer: Self-pay

## 2022-06-05 NOTE — Progress Notes (Signed)
[address]       [place] [address]             [phone]                                                   Zaydah J Tidmore Luttrell Medical Record #[id number] Date of Birth: 01-12-1971   Referring: Garner Nash, DO Primary Care: [name], [place] Primary Cardiologist: None   Chief Complaint:        Chief Complaint  Patient presents with   Lung Lesion      Surgical consult/ Chest CT 04/20/22 and 03/31/22/PFT's 12/27/20      History of Present Illness:    NAYELI CALVERT 51 y.o. female referred for surgical evaluation of a right lower lobe pulmonary nodule.  Has been followed for over a year, and there has been some change noted within the solid component of it.  She is a lifelong non-smoker.  This was originally identified when she was being worked up for gallstone pancreatitis.  She since has recovered from that after having extensive hospitalization.  She also has a history of a cystic lesion in her breast which has been followed with routine mammograms.   She denies any neurologic symptoms or shortness of breath.  Her weight has been stable.  She works out regularly.       Zubrod Score: At the time of surgery this patient's most appropriate activity status/level should be described as: [x]     0    Normal activity, no symptoms []     1    Restricted in physical strenuous activity but ambulatory, able to do out light work []     2    Ambulatory and capable of self care, unable to do work activities, up and about               >50 % of waking hours                              []     3    Only limited self care, in bed greater than 50% of waking hours []     4    Completely disabled, no self care, confined to bed or chair []     5    Moribund         Past Medical History:  Diagnosis Date   Arthritis             Past Surgical History:  Procedure Laterality Date   BIOPSY   02/07/2021    Procedure: BIOPSY;  Surgeon: Rush Landmark, Telford Nab., MD;   Location: [place] [facility];  Service: Gastroenterology;;   CESAREAN SECTION       CHOLECYSTECTOMY N/A 10/10/2016    Procedure: LAPAROSCOPIC CHOLECYSTECTOMY WITH INTRAOPERATIVE CHOLANGIOGRAM;  Surgeon: Erroll Luna, MD;  Location: [facility];  Service: General;  Laterality: N/A;   ESOPHAGOGASTRODUODENOSCOPY (EGD) WITH PROPOFOL N/A 02/01/2021    Procedure: ESOPHAGOGASTRODUODENOSCOPY (EGD) WITH PROPOFOL;  Surgeon: Clarene Essex, MD;  Location: [facility];  Service: Endoscopy;  Laterality: N/A;   ESOPHAGOGASTRODUODENOSCOPY (EGD) WITH PROPOFOL N/A 02/07/2021    Procedure: ESOPHAGOGASTRODUODENOSCOPY (EGD) WITH PROPOFOL;  Surgeon: Rush Landmark Telford Nab., MD;  Location: [facility];  Service: Gastroenterology;  Laterality: N/A;  SHOULDER ARTHROSCOPY       TUBAL LIGATION       UPPER ESOPHAGEAL ENDOSCOPIC ULTRASOUND (EUS) Left 02/07/2021    Procedure: UPPER ESOPHAGEAL ENDOSCOPIC ULTRASOUND (EUS);  Surgeon: Irving Copas., MD;  Location: [facility];  Service: Gastroenterology;  Laterality: Left;           Family History  Problem Relation Age of Onset   Breast cancer Paternal Grandmother          Social History       Tobacco Use  Smoking Status Never  Smokeless Tobacco Never    Social History        Substance and Sexual Activity  Alcohol Use Yes   Alcohol/week: 7.0 standard drinks of alcohol   Types: 7 Glasses of wine per week        No Known Allergies         Current Outpatient Medications  Medication Sig Dispense Refill   escitalopram (LEXAPRO) 10 MG tablet Take 10 mg by mouth daily.       ALPRAZolam (XANAX) 0.5 MG tablet Take 0.5 mg by mouth at bedtime as needed for anxiety or sleep.       docusate sodium (COLACE) 100 MG capsule Take 1 capsule (100 mg total) by mouth 2 (two) times daily as needed for mild constipation. 10 capsule 0   ELIQUIS 5 MG TABS tablet Take by mouth.       enoxaparin (LOVENOX) 100 MG/ML injection Inject 0.9 mLs (90 mg total) into the skin every 12 (twelve)  hours. 0 mL     feeding supplement (ENSURE ENLIVE / ENSURE PLUS) LIQD Take 237 mLs by mouth 2 (two) times daily between meals. 237 mL 12   meropenem 1 g in sodium chloride 0.9 % 100 mL Inject 1 g into the vein every 8 (eight) hours.       methocarbamol (ROBAXIN) 500 MG tablet Take 500 mg by mouth every 6 (six) hours as needed for muscle spasms.       morphine 2 MG/ML injection Inject 0.5 mLs (1 mg total) into the vein every 2 (two) hours as needed. 1 mL 0   ondansetron (ZOFRAN) 4 MG/2ML SOLN injection Inject 2 mLs (4 mg total) into the vein every 6 (six) hours as needed for nausea. 2 mL 0   oxyCODONE (OXY IR/ROXICODONE) 5 MG immediate release tablet Take 1-2 tablets (5-10 mg total) by mouth every 4 (four) hours as needed for moderate pain or severe pain. 30 tablet 0   pantoprazole (PROTONIX) 40 MG injection Inject 40 mg into the vein every 12 (twelve) hours. 1 each     polyethylene glycol (MIRALAX / GLYCOLAX) 17 g packet Take 17 g by mouth daily as needed for moderate constipation. 14 each 0   simethicone (MYLICON) 80 MG chewable tablet Chew 1 tablet (80 mg total) by mouth every 6 (six) hours as needed for flatulence. 30 tablet 0   sodium chloride 0.9 % SOLN 50 mL with promethazine 25 MG/ML SOLN 12.5 mg Inject 12.5 mg into the vein every 6 (six) hours as needed.       traMADol (ULTRAM) 50 MG tablet Take 50 mg by mouth at bedtime as needed for moderate pain.        No current facility-administered medications for this visit.      Review of Systems  Constitutional:  Negative for fever, malaise/fatigue and weight loss.  Respiratory:  Negative for shortness of breath.   Cardiovascular:  Negative for chest  pain.  Neurological: Negative.         PHYSICAL EXAMINATION: BP (!) 140/89   Pulse 72   Resp 20   Ht 5\' 7"  (1.702 m)   Wt 192 lb (87.1 kg)   LMP 02/14/2020   SpO2 98% Comment: RA  BMI 30.07 kg/m  Physical Exam Constitutional:      General: She is not in acute distress.     Appearance: She is normal weight. She is not ill-appearing.  HENT:     Head: Normocephalic and atraumatic.  Eyes:     Extraocular Movements: Extraocular movements intact.  Cardiovascular:     Rate and Rhythm: Normal rate.  Pulmonary:     Effort: Pulmonary effort is normal. No respiratory distress.  Abdominal:     General: Abdomen is flat. There is no distension.  Musculoskeletal:        General: Normal range of motion.     Cervical back: Normal range of motion.  Skin:    General: Skin is warm and dry.  Neurological:     General: No focal deficit present.     Mental Status: She is alert and oriented to person, place, and time.        Diagnostic Studies & Laboratory data:     Recent Radiology Findings:    Imaging Results  CT Super D Chest Wo Contrast   Result Date: 04/20/2022 CLINICAL DATA:  Follow-up ground-glass pulmonary nodule. EXAM: CT CHEST WITHOUT CONTRAST TECHNIQUE: Multidetector CT imaging of the chest was performed using thin slice collimation for electromagnetic bronchoscopy planning purposes, without intravenous contrast. RADIATION DOSE REDUCTION: This exam was performed according to the departmental dose-optimization program which includes automated exposure control, adjustment of the mA and/or kV according to patient size and/or use of iterative reconstruction technique. COMPARISON:  03/31/2021 chest CT. FINDINGS: Cardiovascular: Normal heart size. No significant pericardial effusion/thickening. Great vessels are normal in course and caliber. Mediastinum/Nodes: No significant thyroid nodules. Unremarkable esophagus. No pathologically enlarged axillary, mediastinal or hilar lymph nodes, noting limited sensitivity for the detection of hilar adenopathy on this noncontrast study. Triangular focus of soft tissue and interspersed speckled fat density in the anterior mediastinum, compatible with mild thymic hyperplasia, increased. Calcified left hilar nonenlarged nodes are unchanged  and compatible with prior granulomatous disease. Lungs/Pleura: No pneumothorax. No pleural effusion. Ground-glass superior segment right lower lobe 1.4 x 1.5 x 1 cm pulmonary nodule (series 4/image 75), previously 1.4 x 1.1 cm on 03/31/2021 and 11/22/2020 chest CT studies using similar measurement technique, stable. Lobulated peripherally calcified 1.2 cm solid anterior left lower lobe pulmonary nodule (series 4/image 112), previously 1.2 cm, stable, considered benign. No acute consolidative airspace disease or new significant pulmonary nodules. Upper abdomen: Partial visualization of a pigtail stent in the upper abdomen in the region of the distal stomach in the midline. Musculoskeletal: No aggressive appearing focal osseous lesions. Minimal thoracic spondylosis. IMPRESSION: 1. Ground-glass 1.4 cm superior segment right lower lobe pulmonary nodule is stable since 11/22/2020 baseline chest CT. Noncontrast chest CT follow-up recommended every 2 years until 5 year stability demonstrated. This recommendation follows the consensus statement: Guidelines for Management of Incidental Pulmonary Nodules Detected on CT Images: From the Fleischner Society 2017; Radiology 2017; 284:228-243. 2. Mild thymic hyperplasia, increased. 3. No thoracic adenopathy. Electronically Signed   By: Ilona Sorrel M.D.   On: 04/20/2022 21:19           I have independently reviewed the above radiology studies  and reviewed the findings with the  patient.    Recent Lab Findings: Recent Labs       Lab Results  Component Value Date    WBC 8.1 02/11/2021    HGB 9.3 (L) 02/11/2021    HCT 29.3 (L) 02/11/2021    PLT 352 02/11/2021    GLUCOSE 124 (H) 02/11/2021    ALT 18 02/04/2021    AST 12 (L) 02/04/2021    NA 140 02/11/2021    K 3.8 02/11/2021    CL 103 02/11/2021    CREATININE 0.84 02/11/2021    BUN 5 (L) 02/11/2021    CO2 32 02/11/2021    TSH 3.284 01/18/2021    INR 1.2 02/11/2021    HGBA1C 5.3 01/15/2021           PFTs:   - FVC: 90% - FEV1: 91% -DLCO: 134%        Lungs/Pleura: No pneumothorax. No pleural effusion. Ground-glass superior segment right lower lobe 1.4 x 1.5 x 1 cm pulmonary nodule (series 4/image 75), previously 1.4 x 1.1 cm on 03/31/2021 and 11/22/2020 chest CT studies using similar measurement technique, stable. Lobulated peripherally calcified 1.2 cm solid anterior left lower lobe pulmonary nodule (series 4/image 112), previously 1.2 cm, stable, considered benign. No acute consolidative airspace disease or new significant pulmonary nodules. Assessment / Plan:   51 year old female with a 1.4 cm right lower lobe pulmonary nodule.  This has been stable for over a year, however there have been some changes noted within the solid component on the.  PET/CT showed no significant uptake, but the nodule has changed over time.  We talked about the risks and benefits of navigational bronchoscopy with biopsy followed by surgical resection if this is a cancer.  Regardless of the biopsy results she would like this removed that she has also consented to a right robotic assisted thoracoscopy with right lower lobe wedge and possible lobectomy.  I will coordinate timing with Dr. Valeta Harms.  Given her history I do not think that she requires a stress test.       I  spent 80minutes with  the patient face to face in counseling and coordination of care.     Jalonda Antigua Bary Leriche

## 2022-06-05 NOTE — H&P (View-Only) (Signed)
[address]       [place] [address]             [phone]                                                   Kimberly Kelly Brule Medical Record #[id number] Date of Birth: 11/04/1970   Referring: Kimberly Nash, DO Primary Care: Kimberly Kelly, [place] Primary Cardiologist: None   Chief Complaint:        Chief Complaint  Patient presents with   Lung Lesion      Surgical consult/ Chest CT 04/20/22 and 03/31/22/PFT's 12/27/20      History of Present Illness:    Kimberly Kelly 51 y.o. female referred for surgical evaluation of a right lower lobe pulmonary nodule.  Has been followed for over a year, and there has been some change noted within the solid component of it.  She is a lifelong non-smoker.  This was originally identified when she was being worked up for gallstone pancreatitis.  She since has recovered from that after having extensive hospitalization.  She also has a history of a cystic lesion in her breast which has been followed with routine mammograms.   She denies any neurologic symptoms or shortness of breath.  Her weight has been stable.  She works out regularly.       Zubrod Score: At the time of surgery this patient's most appropriate activity status/level should be described as: [x]     0    Normal activity, no symptoms []     1    Restricted in physical strenuous activity but ambulatory, able to do out light work []     2    Ambulatory and capable of self care, unable to do work activities, up and about               >50 % of waking hours                              []     3    Only limited self care, in bed greater than 50% of waking hours []     4    Completely disabled, no self care, confined to bed or chair []     5    Moribund         Past Medical History:  Diagnosis Date   Arthritis             Past Surgical History:  Procedure Laterality Date   BIOPSY   02/07/2021    Procedure: BIOPSY;  Surgeon: Rush Landmark, Telford Nab., MD;   Location: [facility];  Service: Gastroenterology;;   CESAREAN SECTION       CHOLECYSTECTOMY N/A 10/10/2016    Procedure: LAPAROSCOPIC CHOLECYSTECTOMY WITH INTRAOPERATIVE CHOLANGIOGRAM;  Surgeon: Erroll Luna, MD;  Location: [facility];  Service: General;  Laterality: N/A;   ESOPHAGOGASTRODUODENOSCOPY (EGD) WITH PROPOFOL N/A 02/01/2021    Procedure: ESOPHAGOGASTRODUODENOSCOPY (EGD) WITH PROPOFOL;  Surgeon: Clarene Essex, MD;  Location: [facility];  Service: Endoscopy;  Laterality: N/A;   ESOPHAGOGASTRODUODENOSCOPY (EGD) WITH PROPOFOL N/A 02/07/2021    Procedure: ESOPHAGOGASTRODUODENOSCOPY (EGD) WITH PROPOFOL;  Surgeon: Rush Landmark Telford Nab., MD;  Location: [facility];  Service: Gastroenterology;  Laterality: N/A;  SHOULDER ARTHROSCOPY       TUBAL LIGATION       UPPER ESOPHAGEAL ENDOSCOPIC ULTRASOUND (EUS) Left 02/07/2021    Procedure: UPPER ESOPHAGEAL ENDOSCOPIC ULTRASOUND (EUS);  Surgeon: Irving Copas., MD;  Location: [facility];  Service: Gastroenterology;  Laterality: Left;           Family History  Problem Relation Age of Onset   Breast cancer Paternal Grandmother          Social History       Tobacco Use  Smoking Status Never  Smokeless Tobacco Never    Social History        Substance and Sexual Activity  Alcohol Use Yes   Alcohol/week: 7.0 standard drinks of alcohol   Types: 7 Glasses of wine per week        No Known Allergies         Current Outpatient Medications  Medication Sig Dispense Refill   escitalopram (LEXAPRO) 10 MG tablet Take 10 mg by mouth daily.       ALPRAZolam (XANAX) 0.5 MG tablet Take 0.5 mg by mouth at bedtime as needed for anxiety or sleep.       docusate sodium (COLACE) 100 MG capsule Take 1 capsule (100 mg total) by mouth 2 (two) times daily as needed for mild constipation. 10 capsule 0   ELIQUIS 5 MG TABS tablet Take by mouth.       enoxaparin (LOVENOX) 100 MG/ML injection Inject 0.9 mLs (90 mg total) into the skin every 12 (twelve)  hours. 0 mL     feeding supplement (ENSURE ENLIVE / ENSURE PLUS) LIQD Take 237 mLs by mouth 2 (two) times daily between meals. 237 mL 12   meropenem 1 g in sodium chloride 0.9 % 100 mL Inject 1 g into the vein every 8 (eight) hours.       methocarbamol (ROBAXIN) 500 MG tablet Take 500 mg by mouth every 6 (six) hours as needed for muscle spasms.       morphine 2 MG/ML injection Inject 0.5 mLs (1 mg total) into the vein every 2 (two) hours as needed. 1 mL 0   ondansetron (ZOFRAN) 4 MG/2ML SOLN injection Inject 2 mLs (4 mg total) into the vein every 6 (six) hours as needed for nausea. 2 mL 0   oxyCODONE (OXY IR/ROXICODONE) 5 MG immediate release tablet Take 1-2 tablets (5-10 mg total) by mouth every 4 (four) hours as needed for moderate pain or severe pain. 30 tablet 0   pantoprazole (PROTONIX) 40 MG injection Inject 40 mg into the vein every 12 (twelve) hours. 1 each     polyethylene glycol (MIRALAX / GLYCOLAX) 17 g packet Take 17 g by mouth daily as needed for moderate constipation. 14 each 0   simethicone (MYLICON) 80 MG chewable tablet Chew 1 tablet (80 mg total) by mouth every 6 (six) hours as needed for flatulence. 30 tablet 0   sodium chloride 0.9 % SOLN 50 mL with promethazine 25 MG/ML SOLN 12.5 mg Inject 12.5 mg into the vein every 6 (six) hours as needed.       traMADol (ULTRAM) 50 MG tablet Take 50 mg by mouth at bedtime as needed for moderate pain.        No current facility-administered medications for this visit.      Review of Systems  Constitutional:  Negative for fever, malaise/fatigue and weight loss.  Respiratory:  Negative for shortness of breath.   Cardiovascular:  Negative for chest  pain.  Neurological: Negative.         PHYSICAL EXAMINATION: BP (!) 140/89   Pulse 72   Resp 20   Ht 5\' 7"  (1.702 m)   Wt 192 lb (87.1 kg)   LMP 02/14/2020   SpO2 98% Comment: RA  BMI 30.07 kg/m  Physical Exam Constitutional:      General: She is not in acute distress.     Appearance: She is normal weight. She is not ill-appearing.  HENT:     Head: Normocephalic and atraumatic.  Eyes:     Extraocular Movements: Extraocular movements intact.  Cardiovascular:     Rate and Rhythm: Normal rate.  Pulmonary:     Effort: Pulmonary effort is normal. No respiratory distress.  Abdominal:     General: Abdomen is flat. There is no distension.  Musculoskeletal:        General: Normal range of motion.     Cervical back: Normal range of motion.  Skin:    General: Skin is warm and dry.  Neurological:     General: No focal deficit present.     Mental Status: She is alert and oriented to person, place, and time.        Diagnostic Studies & Laboratory data:     Recent Radiology Findings:    Imaging Results  CT Super D Chest Wo Contrast   Result Date: 04/20/2022 CLINICAL DATA:  Follow-up ground-glass pulmonary nodule. EXAM: CT CHEST WITHOUT CONTRAST TECHNIQUE: Multidetector CT imaging of the chest was performed using thin slice collimation for electromagnetic bronchoscopy planning purposes, without intravenous contrast. RADIATION DOSE REDUCTION: This exam was performed according to the departmental dose-optimization program which includes automated exposure control, adjustment of the mA and/or kV according to patient size and/or use of iterative reconstruction technique. COMPARISON:  03/31/2021 chest CT. FINDINGS: Cardiovascular: Normal heart size. No significant pericardial effusion/thickening. Great vessels are normal in course and caliber. Mediastinum/Nodes: No significant thyroid nodules. Unremarkable esophagus. No pathologically enlarged axillary, mediastinal or hilar lymph nodes, noting limited sensitivity for the detection of hilar adenopathy on this noncontrast study. Triangular focus of soft tissue and interspersed speckled fat density in the anterior mediastinum, compatible with mild thymic hyperplasia, increased. Calcified left hilar nonenlarged nodes are unchanged  and compatible with prior granulomatous disease. Lungs/Pleura: No pneumothorax. No pleural effusion. Ground-glass superior segment right lower lobe 1.4 x 1.5 x 1 cm pulmonary nodule (series 4/image 75), previously 1.4 x 1.1 cm on 03/31/2021 and 11/22/2020 chest CT studies using similar measurement technique, stable. Lobulated peripherally calcified 1.2 cm solid anterior left lower lobe pulmonary nodule (series 4/image 112), previously 1.2 cm, stable, considered benign. No acute consolidative airspace disease or new significant pulmonary nodules. Upper abdomen: Partial visualization of a pigtail stent in the upper abdomen in the region of the distal stomach in the midline. Musculoskeletal: No aggressive appearing focal osseous lesions. Minimal thoracic spondylosis. IMPRESSION: 1. Ground-glass 1.4 cm superior segment right lower lobe pulmonary nodule is stable since 11/22/2020 baseline chest CT. Noncontrast chest CT follow-up recommended every 2 years until 5 year stability demonstrated. This recommendation follows the consensus statement: Guidelines for Management of Incidental Pulmonary Nodules Detected on CT Images: From the Fleischner Society 2017; Radiology 2017; 284:228-243. 2. Mild thymic hyperplasia, increased. 3. No thoracic adenopathy. Electronically Signed   By: Ilona Sorrel M.D.   On: 04/20/2022 21:19           I have independently reviewed the above radiology studies  and reviewed the findings with the  patient.    Recent Lab Findings: Recent Labs       Lab Results  Component Value Date    WBC 8.1 02/11/2021    HGB 9.3 (L) 02/11/2021    HCT 29.3 (L) 02/11/2021    PLT 352 02/11/2021    GLUCOSE 124 (H) 02/11/2021    ALT 18 02/04/2021    AST 12 (L) 02/04/2021    NA 140 02/11/2021    K 3.8 02/11/2021    CL 103 02/11/2021    CREATININE 0.84 02/11/2021    BUN 5 (L) 02/11/2021    CO2 32 02/11/2021    TSH 3.284 01/18/2021    INR 1.2 02/11/2021    HGBA1C 5.3 01/15/2021           PFTs:   - FVC: 90% - FEV1: 91% -DLCO: 134%        Lungs/Pleura: No pneumothorax. No pleural effusion. Ground-glass superior segment right lower lobe 1.4 x 1.5 x 1 cm pulmonary nodule (series 4/image 75), previously 1.4 x 1.1 cm on 03/31/2021 and 11/22/2020 chest CT studies using similar measurement technique, stable. Lobulated peripherally calcified 1.2 cm solid anterior left lower lobe pulmonary nodule (series 4/image 112), previously 1.2 cm, stable, considered benign. No acute consolidative airspace disease or new significant pulmonary nodules. Assessment / Plan:   51 year old female with a 1.4 cm right lower lobe pulmonary nodule.  This has been stable for over a year, however there have been some changes noted within the solid component on the.  PET/CT showed no significant uptake, but the nodule has changed over time.  We talked about the risks and benefits of navigational bronchoscopy with biopsy followed by surgical resection if this is a cancer.  Regardless of the biopsy results she would like this removed that she has also consented to a right robotic assisted thoracoscopy with right lower lobe wedge and possible lobectomy.  I will coordinate timing with Dr. Valeta Harms.  Given her history I do not think that she requires a stress test.       I  spent 50minutes with  the patient face to face in counseling and coordination of care.     Kailoni Vahle Bary Leriche

## 2022-06-08 ENCOUNTER — Other Ambulatory Visit (HOSPITAL_COMMUNITY): Payer: Self-pay

## 2022-06-08 NOTE — Pre-Procedure Instructions (Signed)
Surgical Instructions    Your procedure is scheduled on Thursday, June 11, 2022 at 7:30 AM.  Report to [facility] Main Entrance "A" at 5:30 A.M., then check in with the Admitting office.  Call this number if you have problems the morning of surgery:  [phone]   If you have any questions prior to your surgery date call [phone]: Open Monday-Friday 8am-4pm  *If you experience any cold or flu symptoms such as cough, fever, chills, shortness of breath, etc. between now and your scheduled surgery, please notify us.*    Remember:  Do not or drink eat after midnight the night before your surgery    Take these medicines the morning of surgery with A SIP OF WATER:  escitalopram (LEXAPRO)  methocarbamol (ROBAXIN) - if needed  As of today, STOP taking any Aspirin (unless otherwise instructed by your surgeon) Aleve, Naproxen, Ibuprofen, Motrin, Advil, Goody's, BC's, all herbal medications, fish oil, and all vitamins.                     Do NOT Smoke (Tobacco/Vaping) for 24 hours prior to your procedure.  If you use a CPAP at night, you may bring your mask/headgear for your overnight stay.   Contacts, glasses, piercing's, hearing aid's, dentures or partials may not be worn into surgery, please bring cases for these belongings.    For patients admitted to the hospital, discharge time will be determined by your treatment team.   Patients discharged the day of surgery will not be allowed to drive home, and someone needs to stay with them for 24 hours.  SURGICAL WAITING ROOM VISITATION Patients having surgery or a procedure may have two support people in the waiting area. Visitors may stay in the waiting area during the procedure and switch out with other visitors if needed. Children under the age of 59 must have an adult accompany them who is not the patient. If the patient needs to stay at the hospital during part of their recovery, the visitor guidelines for inpatient rooms  apply.  Please refer to the [facility] website for the visitor guidelines for Inpatients (after your surgery is over and you are in a regular room).    Special instructions:   [place]- Preparing For Surgery  Before surgery, you can play an important role. Because skin is not sterile, your skin needs to be as free of germs as possible. You can reduce the number of germs on your skin by washing with CHG (chlorahexidine gluconate) Soap before surgery.  CHG is an antiseptic cleaner which kills germs and bonds with the skin to continue killing germs even after washing.    Oral Hygiene is also important to reduce your risk of infection.  Remember - BRUSH YOUR TEETH THE MORNING OF SURGERY WITH YOUR REGULAR TOOTHPASTE  Please do not use if you have an allergy to CHG or antibacterial soaps. If your skin becomes reddened/irritated stop using the CHG.  Do not shave (including legs and underarms) for at least 48 hours prior to first CHG shower. It is OK to shave your face.  Please follow these instructions carefully.   Shower the NIGHT BEFORE SURGERY and the MORNING OF SURGERY  If you chose to wash your hair, wash your hair first as usual with your normal shampoo.  After you shampoo, rinse your hair and body thoroughly to remove the shampoo.  Use CHG Soap as you would any other liquid soap. You can apply CHG directly to  the skin and wash gently with a scrungie or a clean washcloth.   Apply the CHG Soap to your body ONLY FROM THE NECK DOWN.  Do not use on open wounds or open sores. Avoid contact with your eyes, ears, mouth and genitals (private parts). Wash Face and genitals (private parts)  with your normal soap.   Wash thoroughly, paying special attention to the area where your surgery will be performed.  Thoroughly rinse your body with warm water from the neck down.  DO NOT shower/wash with your normal soap after using and rinsing off the CHG Soap.  Pat yourself dry with a CLEAN  TOWEL.  Wear CLEAN PAJAMAS to bed the night before surgery  Place CLEAN SHEETS on your bed the night before your surgery  DO NOT SLEEP WITH PETS.   Day of Surgery: Take a shower with CHG soap. Do not wear jewelry or makeup Do not wear lotions, powders, perfumes/colognes, or deodorant. Do not shave 48 hours prior to surgery. Do not bring valuables to the hospital.  [facility] is not responsible for any belongings or valuables. Do not wear nail polish, gel polish, artificial nails, or any other type of covering on natural nails (fingers and toes) If you have artificial nails or gel coating that need to be removed by a nail salon, please have this removed prior to surgery. Artificial nails or gel coating may interfere with anesthesia's ability to adequately monitor your vital signs. Wear Clean/Comfortable clothing the morning of surgery Do not apply any deodorants/lotions.   Remember to brush your teeth WITH YOUR REGULAR TOOTHPASTE.   Please read over the following fact sheets that you were given.  If you received a COVID test during your pre-op visit  it is requested that you wear a mask when out in public, stay away from anyone that may not be feeling well and notify your surgeon if you develop symptoms. If you have been in contact with anyone that has tested positive in the last 10 days please notify you surgeon.

## 2022-06-09 ENCOUNTER — Ambulatory Visit (HOSPITAL_COMMUNITY)
Admission: RE | Admit: 2022-06-09 | Discharge: 2022-06-09 | Disposition: A | Discharge status: Home / Self Care | Payer: 59 | Source: Ambulatory Visit | Attending: Thoracic Surgery (Cardiothoracic Vascular Surgery) | Admitting: Thoracic Surgery (Cardiothoracic Vascular Surgery)

## 2022-06-09 ENCOUNTER — Encounter (HOSPITAL_COMMUNITY): Payer: Self-pay

## 2022-06-09 ENCOUNTER — Encounter (HOSPITAL_COMMUNITY)
Admission: RE | Admit: 2022-06-09 | Discharge: 2022-06-09 | Disposition: A | Discharge status: Home / Self Care | Payer: 59 | Source: Ambulatory Visit | Attending: Pulmonary Disease | Admitting: Pulmonary Disease

## 2022-06-09 ENCOUNTER — Other Ambulatory Visit: Payer: Self-pay

## 2022-06-09 VITALS — BP 134/86 | HR 71 | Temp 97.5°F | Resp 17 | Ht 67.0 in | Wt 193.1 lb

## 2022-06-09 DIAGNOSIS — Z1152 Encounter for screening for COVID-19: Secondary | ICD-10-CM | POA: Insufficient documentation

## 2022-06-09 DIAGNOSIS — R911 Solitary pulmonary nodule: Secondary | ICD-10-CM

## 2022-06-09 DIAGNOSIS — Z01818 Encounter for other preprocedural examination: Secondary | ICD-10-CM | POA: Insufficient documentation

## 2022-06-09 HISTORY — DX: Other complications of anesthesia, initial encounter: T88.59XA

## 2022-06-09 HISTORY — DX: Anxiety disorder, unspecified: F41.9

## 2022-06-09 LAB — COMPREHENSIVE METABOLIC PANEL
ALT: 35 U/L (ref 0–44)
AST: 27 U/L (ref 15–41)
Albumin: 3.7 g/dL (ref 3.5–5.0)
Alkaline Phosphatase: 189 U/L — ABNORMAL HIGH (ref 38–126)
Anion gap: 10 (ref 5–15)
BUN: 16 mg/dL (ref 6–20)
CO2: 26 mmol/L (ref 22–32)
Calcium: 9.2 mg/dL (ref 8.9–10.3)
Chloride: 105 mmol/L (ref 98–111)
Creatinine, Ser: 0.83 mg/dL (ref 0.44–1.00)
GFR, Estimated: >60 mL/min (ref >60)
Glucose, Bld: 118 mg/dL — ABNORMAL HIGH (ref 70–99)
Potassium: 3.8 mmol/L (ref 3.5–5.1)
Sodium: 141 mmol/L (ref 135–145)
Total Bilirubin: 0.9 mg/dL (ref 0.3–1.2)
Total Protein: 7 g/dL (ref 6.5–8.1)

## 2022-06-09 LAB — CBC
HCT: 38.8 % (ref 36.0–46.0)
Hemoglobin: 12.9 g/dL (ref 12.0–15.0)
MCH: 28 pg (ref 26.0–34.0)
MCHC: 33.2 g/dL (ref 30.0–36.0)
MCV: 84.2 fL (ref 80.0–100.0)
Platelets: 159 10*3/uL (ref 150–400)
RBC: 4.61 MIL/uL (ref 3.87–5.11)
RDW: 13.3 % (ref 11.5–15.5)
WBC: 3.6 10*3/uL — ABNORMAL LOW (ref 4.0–10.5)
nRBC: 0 % (ref 0.0–0.2)

## 2022-06-09 LAB — URINALYSIS, ROUTINE W REFLEX MICROSCOPIC
Bilirubin Urine: NEGATIVE
Glucose, UA: NEGATIVE mg/dL
Hgb urine dipstick: NEGATIVE
Ketones, ur: NEGATIVE mg/dL
Leukocytes,Ua: NEGATIVE
Nitrite: NEGATIVE
Protein, ur: NEGATIVE mg/dL
Specific Gravity, Urine: 1.027 (ref 1.005–1.030)
pH: 5 (ref 5.0–8.0)

## 2022-06-09 LAB — PULMONARY FUNCTION TEST
DL/VA % pred: 105 %
DL/VA: 4.44 ml/min/mmHg/L
DLCO unc % pred: 92 %
DLCO unc: 21.39 ml/min/mmHg
FEF 25-75 Pre: 2.64 L/sec
FEF2575-%Pred-Pre: 92 %
FEV1-%Pred-Pre: 88 %
FEV1-Pre: 2.71 L
FEV1FVC-%Pred-Pre: 102 %
FEV6-%Pred-Pre: 88 %
FEV6-Pre: 3.33 L
FEV6FVC-%Pred-Pre: 102 %
FVC-%Pred-Pre: 85 %
FVC-Pre: 3.33 L
Pre FEV1/FVC ratio: 81 %
Pre FEV6/FVC Ratio: 100 %
RV % pred: 112 %
RV: 2.23 L
TLC % pred: 102 %
TLC: 5.62 L

## 2022-06-09 LAB — PROTIME-INR
INR: 1.1 (ref 0.8–1.2)
Prothrombin Time: 14.4 seconds (ref 11.4–15.2)

## 2022-06-09 LAB — SURGICAL PCR SCREEN
MRSA, PCR: NEGATIVE
Staphylococcus aureus: NEGATIVE

## 2022-06-09 LAB — TYPE AND SCREEN
ABO/RH(D): A POS
Antibody Screen: NEGATIVE

## 2022-06-09 LAB — APTT: aPTT: 32 seconds (ref 24–36)

## 2022-06-09 NOTE — Progress Notes (Signed)
PCP - Nicholes Rough PA Cardiologist - Denies Pulmonologist: Dr. June Leap GI: Owens Shark PA  PPM/ICD - Denies  Chest x-ray - 06/09/22 EKG - 06/09/22 Stress Test - Denies ECHO - Denies Cardiac Cath - Denies  Sleep Study - Denies  Diabetes: Denies  Blood Thinner Instructions: N/A Aspirin Instructions: N/A  ERAS Protcol - No  COVID TEST- 06/09/22 in PAT   Anesthesia review: Yes, abnormal EKG  Patient denies shortness of breath, fever, cough and chest pain at PAT appointment   All instructions explained to the patient, with a verbal understanding of the material. Patient agrees to go over the instructions while at home for a better understanding. Patient also instructed to self quarantine after being tested for COVID-19. The opportunity to ask questions was provided.

## 2022-06-10 LAB — SARS CORONAVIRUS 2 (TAT 6-24 HRS): SARS Coronavirus 2: NEGATIVE

## 2022-06-10 NOTE — Anesthesia Preprocedure Evaluation (Addendum)
Anesthesia Evaluation  Patient identified by MRN, date of birth, ID band Patient awake    Reviewed: Allergy & Precautions, NPO status , Patient's Chart, lab work & pertinent test results  History of Anesthesia Complications (+) history of anesthetic complications  Airway Mallampati: II  TM Distance: >3 FB Neck ROM: Full    Dental no notable dental hx. (+) Dental Advisory Given   Pulmonary neg pulmonary ROS   Pulmonary exam normal        Cardiovascular negative cardio ROS Normal cardiovascular exam     Neuro/Psych  PSYCHIATRIC DISORDERS Anxiety Depression    negative neurological ROS     GI/Hepatic Neg liver ROS,GERD  ,,  Endo/Other  negative endocrine ROS    Renal/GU negative Renal ROS  negative genitourinary   Musculoskeletal  (+) Arthritis ,    Abdominal   Peds  Hematology negative hematology ROS (+)   Anesthesia Other Findings   Reproductive/Obstetrics                             Anesthesia Physical Anesthesia Plan  ASA: 3  Anesthesia Plan: General   Post-op Pain Management: Minimal or no pain anticipated   Induction: Intravenous  PONV Risk Score and Plan: 2 and Treatment may vary due to age or medical condition, Propofol infusion and TIVA  Airway Management Planned: Oral ETT  Additional Equipment: ClearSight  Intra-op Plan:   Post-operative Plan: Extubation in OR  Informed Consent: I have reviewed the patients History and Physical, chart, labs and discussed the procedure including the risks, benefits and alternatives for the proposed anesthesia with the patient or authorized representative who has indicated his/her understanding and acceptance.     Dental advisory given  Plan Discussed with: Anesthesiologist and CRNA  Anesthesia Plan Comments:         Anesthesia Quick Evaluation

## 2022-06-11 ENCOUNTER — Inpatient Hospital Stay (HOSPITAL_COMMUNITY): Payer: 59 | Admitting: Vascular Surgery

## 2022-06-11 ENCOUNTER — Ambulatory Visit (HOSPITAL_COMMUNITY): Admission: RE | Admit: 2022-06-11 | Payer: 59 | Source: Ambulatory Visit | Admitting: Student

## 2022-06-11 ENCOUNTER — Encounter (HOSPITAL_COMMUNITY)
Admission: RE | Disposition: A | Discharge status: Home / Self Care | Payer: Self-pay | Source: Ambulatory Visit | Attending: Thoracic Surgery (Cardiothoracic Vascular Surgery)

## 2022-06-11 ENCOUNTER — Inpatient Hospital Stay (HOSPITAL_COMMUNITY): Payer: 59

## 2022-06-11 ENCOUNTER — Encounter (HOSPITAL_COMMUNITY): Payer: Self-pay | Admitting: Thoracic Surgery (Cardiothoracic Vascular Surgery)

## 2022-06-11 ENCOUNTER — Inpatient Hospital Stay (HOSPITAL_COMMUNITY): Payer: 59 | Admitting: Anesthesiology

## 2022-06-11 ENCOUNTER — Inpatient Hospital Stay (HOSPITAL_COMMUNITY)
Admission: RE | Admit: 2022-06-11 | Discharge: 2022-06-13 | DRG: 164 | Disposition: A | Discharge status: Home / Self Care | Payer: 59 | Attending: Thoracic Surgery (Cardiothoracic Vascular Surgery) | Admitting: Thoracic Surgery (Cardiothoracic Vascular Surgery)

## 2022-06-11 DIAGNOSIS — Z01818 Encounter for other preprocedural examination: Secondary | ICD-10-CM

## 2022-06-11 DIAGNOSIS — D62 Acute posthemorrhagic anemia: Secondary | ICD-10-CM | POA: Diagnosis not present

## 2022-06-11 DIAGNOSIS — R911 Solitary pulmonary nodule: Secondary | ICD-10-CM | POA: Diagnosis present

## 2022-06-11 DIAGNOSIS — F419 Anxiety disorder, unspecified: Secondary | ICD-10-CM | POA: Diagnosis present

## 2022-06-11 DIAGNOSIS — J9382 Other air leak: Secondary | ICD-10-CM | POA: Diagnosis not present

## 2022-06-11 DIAGNOSIS — F418 Other specified anxiety disorders: Secondary | ICD-10-CM

## 2022-06-11 DIAGNOSIS — D696 Thrombocytopenia, unspecified: Secondary | ICD-10-CM | POA: Diagnosis not present

## 2022-06-11 DIAGNOSIS — Z1152 Encounter for screening for COVID-19: Secondary | ICD-10-CM

## 2022-06-11 DIAGNOSIS — Z79899 Other long term (current) drug therapy: Secondary | ICD-10-CM

## 2022-06-11 DIAGNOSIS — C3431 Malignant neoplasm of lower lobe, right bronchus or lung: Secondary | ICD-10-CM | POA: Diagnosis present

## 2022-06-11 DIAGNOSIS — J948 Other specified pleural conditions: Secondary | ICD-10-CM | POA: Diagnosis not present

## 2022-06-11 DIAGNOSIS — M25511 Pain in right shoulder: Secondary | ICD-10-CM | POA: Diagnosis not present

## 2022-06-11 DIAGNOSIS — Z7901 Long term (current) use of anticoagulants: Secondary | ICD-10-CM | POA: Diagnosis not present

## 2022-06-11 DIAGNOSIS — M1990 Unspecified osteoarthritis, unspecified site: Secondary | ICD-10-CM

## 2022-06-11 DIAGNOSIS — Z9049 Acquired absence of other specified parts of digestive tract: Secondary | ICD-10-CM | POA: Diagnosis not present

## 2022-06-11 DIAGNOSIS — K219 Gastro-esophageal reflux disease without esophagitis: Secondary | ICD-10-CM | POA: Diagnosis present

## 2022-06-11 DIAGNOSIS — Z803 Family history of malignant neoplasm of breast: Secondary | ICD-10-CM

## 2022-06-11 DIAGNOSIS — J939 Pneumothorax, unspecified: Secondary | ICD-10-CM | POA: Diagnosis not present

## 2022-06-11 DIAGNOSIS — F32A Depression, unspecified: Secondary | ICD-10-CM | POA: Diagnosis present

## 2022-06-11 HISTORY — PX: BRONCHIAL BIOPSY: SHX5109

## 2022-06-11 HISTORY — PX: BRONCHIAL BRUSHINGS: SHX5108

## 2022-06-11 HISTORY — PX: BRONCHIAL NEEDLE ASPIRATION BIOPSY: SHX5106

## 2022-06-11 HISTORY — PX: INTERCOSTAL NERVE BLOCK: SHX5021

## 2022-06-11 HISTORY — PX: FIDUCIAL MARKER PLACEMENT: SHX6858

## 2022-06-11 LAB — ABO/RH: ABO/RH(D): A POS

## 2022-06-11 SURGERY — WEDGE RESECTION, LUNG, ROBOT-ASSISTED, THORACOSCOPIC
Anesthesia: General | Site: Chest | Laterality: Right

## 2022-06-11 SURGERY — BRONCHOSCOPY, WITH BIOPSY USING ELECTROMAGNETIC NAVIGATION
Anesthesia: General

## 2022-06-11 MED ORDER — MIDAZOLAM HCL 5 MG/5ML IJ SOLN
INTRAMUSCULAR | Status: DC | PRN
  Administered 2022-06-11: 2 mg via INTRAVENOUS

## 2022-06-11 MED ORDER — SUGAMMADEX SODIUM 200 MG/2ML IV SOLN
INTRAVENOUS | Status: DC | PRN
  Administered 2022-06-11: 200 mg via INTRAVENOUS

## 2022-06-11 MED ORDER — CHLORHEXIDINE GLUCONATE 0.12 % MT SOLN
15.0000 mL | Freq: Once | OROMUCOSAL | Status: AC
  Administered 2022-06-11: 15 mL via OROMUCOSAL
  Filled 2022-06-11: qty 15

## 2022-06-11 MED ORDER — SENNOSIDES-DOCUSATE SODIUM 8.6-50 MG PO TABS
1.0000 | ORAL_TABLET | Freq: Every day | ORAL | Status: DC
  Administered 2022-06-11: 1 via ORAL
  Filled 2022-06-11 (×2): qty 1

## 2022-06-11 MED ORDER — FENTANYL CITRATE (PF) 250 MCG/5ML IJ SOLN
INTRAMUSCULAR | Status: AC
  Filled 2022-06-11: qty 5

## 2022-06-11 MED ORDER — ORAL CARE MOUTH RINSE: 15.0000 mL | Freq: Once | OROMUCOSAL | Status: AC

## 2022-06-11 MED ORDER — CEFAZOLIN SODIUM-DEXTROSE 2-4 GM/100ML-% IV SOLN
2.0000 g | Freq: Three times a day (TID) | INTRAVENOUS | Status: AC
  Administered 2022-06-11 – 2022-06-12 (×2): 2 g via INTRAVENOUS
  Filled 2022-06-11 (×2): qty 100

## 2022-06-11 MED ORDER — LIDOCAINE 2% (20 MG/ML) 5 ML SYRINGE
INTRAMUSCULAR | Status: AC
  Filled 2022-06-11: qty 5

## 2022-06-11 MED ORDER — PANTOPRAZOLE SODIUM 40 MG PO TBEC
40.0000 mg | DELAYED_RELEASE_TABLET | Freq: Every day | ORAL | Status: DC
  Administered 2022-06-12 – 2022-06-13 (×2): 40 mg via ORAL
  Filled 2022-06-11 (×2): qty 1

## 2022-06-11 MED ORDER — METHYLENE BLUE 1 % INJ SOLN
INTRAVENOUS | Status: DC | PRN
  Administered 2022-06-11: 1 mL

## 2022-06-11 MED ORDER — CEFAZOLIN SODIUM-DEXTROSE 2-4 GM/100ML-% IV SOLN
2.0000 g | INTRAVENOUS | Status: AC
  Administered 2022-06-11: 2 g via INTRAVENOUS
  Filled 2022-06-11: qty 100

## 2022-06-11 MED ORDER — PROPOFOL 500 MG/50ML IV EMUL
INTRAVENOUS | Status: DC | PRN
  Administered 2022-06-11: 150 ug/kg/min via INTRAVENOUS
  Administered 2022-06-11: 15 ug/kg/min via INTRAVENOUS
  Administered 2022-06-11: 125 ug/kg/min via INTRAVENOUS

## 2022-06-11 MED ORDER — BUPIVACAINE LIPOSOME 1.3 % IJ SUSP
INTRAMUSCULAR | Status: AC
  Filled 2022-06-11: qty 20

## 2022-06-11 MED ORDER — BISACODYL 5 MG PO TBEC
10.0000 mg | DELAYED_RELEASE_TABLET | Freq: Every day | ORAL | Status: DC
  Administered 2022-06-11 – 2022-06-12 (×2): 10 mg via ORAL
  Filled 2022-06-11 (×2): qty 2

## 2022-06-11 MED ORDER — MIDAZOLAM HCL 2 MG/2ML IJ SOLN
INTRAMUSCULAR | Status: AC
  Filled 2022-06-11: qty 2

## 2022-06-11 MED ORDER — ALPRAZOLAM 0.5 MG PO TABS: 0.5000 mg | ORAL_TABLET | Freq: Every evening | ORAL | Status: DC | PRN

## 2022-06-11 MED ORDER — PROPOFOL 10 MG/ML IV BOLUS
INTRAVENOUS | Status: AC
  Filled 2022-06-11: qty 20

## 2022-06-11 MED ORDER — ONDANSETRON HCL 4 MG/2ML IJ SOLN
4.0000 mg | Freq: Four times a day (QID) | INTRAMUSCULAR | Status: DC | PRN
  Administered 2022-06-12: 4 mg via INTRAVENOUS
  Filled 2022-06-11: qty 2

## 2022-06-11 MED ORDER — DEXAMETHASONE SODIUM PHOSPHATE 10 MG/ML IJ SOLN
INTRAMUSCULAR | Status: AC
  Filled 2022-06-11: qty 1

## 2022-06-11 MED ORDER — DEXAMETHASONE SODIUM PHOSPHATE 10 MG/ML IJ SOLN
INTRAMUSCULAR | Status: DC | PRN
  Administered 2022-06-11: 10 mg via INTRAVENOUS

## 2022-06-11 MED ORDER — MEPERIDINE HCL 25 MG/ML IJ SOLN: 6.2500 mg | INTRAMUSCULAR | Status: DC | PRN

## 2022-06-11 MED ORDER — 0.9 % SODIUM CHLORIDE (POUR BTL) OPTIME
TOPICAL | Status: DC | PRN
  Administered 2022-06-11: 2000 mL

## 2022-06-11 MED ORDER — ACETAMINOPHEN 500 MG PO TABS
1000.0000 mg | ORAL_TABLET | Freq: Four times a day (QID) | ORAL | Status: DC
  Administered 2022-06-11 – 2022-06-13 (×8): 1000 mg via ORAL
  Filled 2022-06-11 (×8): qty 2

## 2022-06-11 MED ORDER — FENTANYL CITRATE (PF) 100 MCG/2ML IJ SOLN
INTRAMUSCULAR | Status: AC
  Filled 2022-06-11: qty 2

## 2022-06-11 MED ORDER — LACTATED RINGERS IV SOLN: INTRAVENOUS | Status: DC

## 2022-06-11 MED ORDER — METHOCARBAMOL 500 MG PO TABS: 500.0000 mg | ORAL_TABLET | Freq: Four times a day (QID) | ORAL | Status: DC | PRN

## 2022-06-11 MED ORDER — ACETAMINOPHEN 160 MG/5ML PO SOLN: 325.0000 mg | ORAL | Status: DC | PRN

## 2022-06-11 MED ORDER — FENTANYL CITRATE (PF) 250 MCG/5ML IJ SOLN
INTRAMUSCULAR | Status: DC | PRN
  Administered 2022-06-11: 100 ug via INTRAVENOUS
  Administered 2022-06-11 (×6): 50 ug via INTRAVENOUS

## 2022-06-11 MED ORDER — PROPOFOL 1000 MG/100ML IV EMUL
INTRAVENOUS | Status: AC
  Filled 2022-06-11: qty 100

## 2022-06-11 MED ORDER — ROCURONIUM BROMIDE 10 MG/ML (PF) SYRINGE
PREFILLED_SYRINGE | INTRAVENOUS | Status: DC | PRN
  Administered 2022-06-11: 50 mg via INTRAVENOUS
  Administered 2022-06-11: 100 mg via INTRAVENOUS
  Administered 2022-06-11: 10 mg via INTRAVENOUS

## 2022-06-11 MED ORDER — KETOROLAC TROMETHAMINE 15 MG/ML IJ SOLN
15.0000 mg | Freq: Four times a day (QID) | INTRAMUSCULAR | Status: AC
  Administered 2022-06-11 – 2022-06-13 (×8): 15 mg via INTRAVENOUS
  Filled 2022-06-11 (×8): qty 1

## 2022-06-11 MED ORDER — ENOXAPARIN SODIUM 40 MG/0.4ML IJ SOSY
40.0000 mg | PREFILLED_SYRINGE | Freq: Every day | INTRAMUSCULAR | Status: DC
  Administered 2022-06-12 – 2022-06-13 (×2): 40 mg via SUBCUTANEOUS
  Filled 2022-06-11 (×2): qty 0.4

## 2022-06-11 MED ORDER — PHENYLEPHRINE 80 MCG/ML (10ML) SYRINGE FOR IV PUSH (FOR BLOOD PRESSURE SUPPORT)
PREFILLED_SYRINGE | INTRAVENOUS | Status: DC | PRN
  Administered 2022-06-11: 120 ug via INTRAVENOUS
  Administered 2022-06-11: 80 ug via INTRAVENOUS
  Administered 2022-06-11: 40 ug via INTRAVENOUS
  Administered 2022-06-11 (×3): 80 ug via INTRAVENOUS

## 2022-06-11 MED ORDER — OXYCODONE HCL 5 MG/5ML PO SOLN: 5.0000 mg | Freq: Once | ORAL | Status: AC | PRN

## 2022-06-11 MED ORDER — ONDANSETRON HCL 4 MG/2ML IJ SOLN
INTRAMUSCULAR | Status: DC | PRN
  Administered 2022-06-11: 4 mg via INTRAVENOUS

## 2022-06-11 MED ORDER — ESCITALOPRAM OXALATE 10 MG PO TABS
10.0000 mg | ORAL_TABLET | Freq: Every day | ORAL | Status: DC
  Administered 2022-06-12 – 2022-06-13 (×2): 10 mg via ORAL
  Filled 2022-06-11 (×2): qty 1

## 2022-06-11 MED ORDER — TRAMADOL HCL 50 MG PO TABS
50.0000 mg | ORAL_TABLET | Freq: Four times a day (QID) | ORAL | Status: DC | PRN
  Administered 2022-06-11: 100 mg via ORAL
  Filled 2022-06-11: qty 2

## 2022-06-11 MED ORDER — PHENYLEPHRINE HCL-NACL 20-0.9 MG/250ML-% IV SOLN
INTRAVENOUS | Status: DC | PRN
  Administered 2022-06-11: 15 ug/min via INTRAVENOUS

## 2022-06-11 MED ORDER — FENTANYL CITRATE (PF) 100 MCG/2ML IJ SOLN
25.0000 ug | INTRAMUSCULAR | Status: DC | PRN
  Administered 2022-06-11 (×3): 50 ug via INTRAVENOUS

## 2022-06-11 MED ORDER — LACTATED RINGERS IV SOLN: INTRAVENOUS | Status: DC | PRN

## 2022-06-11 MED ORDER — ONDANSETRON HCL 4 MG/2ML IJ SOLN: 4.0000 mg | Freq: Once | INTRAMUSCULAR | Status: DC | PRN

## 2022-06-11 MED ORDER — ROCURONIUM BROMIDE 10 MG/ML (PF) SYRINGE
PREFILLED_SYRINGE | INTRAVENOUS | Status: AC
  Filled 2022-06-11: qty 10

## 2022-06-11 MED ORDER — OXYCODONE HCL 5 MG PO TABS
ORAL_TABLET | ORAL | Status: AC
  Filled 2022-06-11: qty 1

## 2022-06-11 MED ORDER — PROPOFOL 10 MG/ML IV BOLUS
INTRAVENOUS | Status: DC | PRN
  Administered 2022-06-11: 60 mg via INTRAVENOUS
  Administered 2022-06-11: 200 mg via INTRAVENOUS
  Administered 2022-06-11: 60 mg via INTRAVENOUS

## 2022-06-11 MED ORDER — LIDOCAINE 2% (20 MG/ML) 5 ML SYRINGE
INTRAMUSCULAR | Status: DC | PRN
  Administered 2022-06-11: 100 mg via INTRAVENOUS

## 2022-06-11 MED ORDER — OXYCODONE HCL 5 MG PO TABS
5.0000 mg | ORAL_TABLET | Freq: Once | ORAL | Status: AC | PRN
  Administered 2022-06-11: 5 mg via ORAL

## 2022-06-11 MED ORDER — BUPIVACAINE HCL (PF) 0.5 % IJ SOLN
INTRAMUSCULAR | Status: AC
  Filled 2022-06-11: qty 30

## 2022-06-11 MED ORDER — MORPHINE SULFATE (PF) 2 MG/ML IV SOLN
2.0000 mg | INTRAVENOUS | Status: DC | PRN
  Administered 2022-06-11 – 2022-06-12 (×2): 2 mg via INTRAVENOUS
  Filled 2022-06-11 (×2): qty 1

## 2022-06-11 MED ORDER — ACETAMINOPHEN 160 MG/5ML PO SOLN: 1000.0000 mg | Freq: Four times a day (QID) | ORAL | Status: DC

## 2022-06-11 MED ORDER — SODIUM CHLORIDE FLUSH 0.9 % IV SOLN
INTRAVENOUS | Status: DC | PRN
  Administered 2022-06-11: 100 mL

## 2022-06-11 MED ORDER — ACETAMINOPHEN 325 MG PO TABS: 325.0000 mg | ORAL_TABLET | ORAL | Status: DC | PRN

## 2022-06-11 MED ORDER — ONDANSETRON HCL 4 MG/2ML IJ SOLN
INTRAMUSCULAR | Status: AC
  Filled 2022-06-11: qty 2

## 2022-06-11 SURGICAL SUPPLY — 93 items
BLADE CLIPPER SURG (BLADE) ×1 IMPLANT
CANISTER SUCT 3000ML PPV (MISCELLANEOUS) ×2 IMPLANT
CANNULA REDUC XI 12-8 STAPL (CANNULA) ×2
CANNULA REDUCER 12-8 DVNC XI (CANNULA) ×2 IMPLANT
CATH TROCAR 20FR (CATHETERS) IMPLANT
CHLORAPREP W/TINT 26 (MISCELLANEOUS) ×1 IMPLANT
CNTNR URN SCR LID CUP LEK RST (MISCELLANEOUS) ×5 IMPLANT
CONN ST 1/4X3/8  BEN (MISCELLANEOUS)
CONN ST 1/4X3/8 BEN (MISCELLANEOUS) IMPLANT
CONT SPEC 4OZ STRL OR WHT (MISCELLANEOUS) ×5
DEFOGGER SCOPE WARMER CLEARIFY (MISCELLANEOUS) ×1 IMPLANT
DERMABOND ADVANCED .7 DNX12 (GAUZE/BANDAGES/DRESSINGS) ×1 IMPLANT
DRAIN CHANNEL 28F RND 3/8 FF (WOUND CARE) IMPLANT
DRAIN CHANNEL 32F RND 10.7 FF (WOUND CARE) IMPLANT
DRAPE ARM DVNC X/XI (DISPOSABLE) ×4 IMPLANT
DRAPE COLUMN DVNC XI (DISPOSABLE) ×1 IMPLANT
DRAPE CV SPLIT W-CLR ANES SCRN (DRAPES) ×1 IMPLANT
DRAPE DA VINCI XI ARM (DISPOSABLE) ×4
DRAPE DA VINCI XI COLUMN (DISPOSABLE) ×1
DRAPE HALF SHEET 40X57 (DRAPES) ×1 IMPLANT
DRAPE ORTHO SPLIT 77X108 STRL (DRAPES) ×1
DRAPE SURG ORHT 6 SPLT 77X108 (DRAPES) ×1 IMPLANT
ELECT REM PT RETURN 9FT ADLT (ELECTROSURGICAL) ×1
ELECTRODE REM PT RTRN 9FT ADLT (ELECTROSURGICAL) ×1 IMPLANT
GAUZE KITTNER 4X5 RF (MISCELLANEOUS) ×1 IMPLANT
GAUZE SPONGE 4X4 12PLY STRL (GAUZE/BANDAGES/DRESSINGS) IMPLANT
GLOVE BIO SURGEON STRL SZ7.5 (GLOVE) ×2 IMPLANT
GLOVE SURG SS PI 8.0 STRL IVOR (GLOVE) ×1 IMPLANT
GOWN STRL REUS W/ TWL LRG LVL3 (GOWN DISPOSABLE) ×2 IMPLANT
GOWN STRL REUS W/ TWL XL LVL3 (GOWN DISPOSABLE) ×2 IMPLANT
GOWN STRL REUS W/TWL 2XL LVL3 (GOWN DISPOSABLE) ×1 IMPLANT
GOWN STRL REUS W/TWL LRG LVL3 (GOWN DISPOSABLE) ×2
GOWN STRL REUS W/TWL XL LVL3 (GOWN DISPOSABLE) ×2
HEMOSTAT SURGICEL 2X14 (HEMOSTASIS) ×3 IMPLANT
KIT BASIN OR (CUSTOM PROCEDURE TRAY) ×1 IMPLANT
KIT TURNOVER KIT B (KITS) ×1 IMPLANT
NDL 22X1.5 STRL (OR ONLY) (MISCELLANEOUS) ×1 IMPLANT
NEEDLE 22X1.5 STRL (OR ONLY) (MISCELLANEOUS) ×1 IMPLANT
NS IRRIG 1000ML POUR BTL (IV SOLUTION) ×3 IMPLANT
PACK CHEST (CUSTOM PROCEDURE TRAY) ×1 IMPLANT
PAD ARMBOARD 7.5X6 YLW CONV (MISCELLANEOUS) ×5 IMPLANT
PORT ACCESS TROCAR AIRSEAL 12 (TROCAR) ×1 IMPLANT
RELOAD STAPLE 45 2.0 GRY DVNC (STAPLE) IMPLANT
RELOAD STAPLE 45 2.5 WHT DVNC (STAPLE) IMPLANT
RELOAD STAPLE 45 3.5 BLU DVNC (STAPLE) IMPLANT
RELOAD STAPLE 45 4.3 GRN DVNC (STAPLE) IMPLANT
RELOAD STAPLER 2.5X45 WHT DVNC (STAPLE) ×2 IMPLANT
RELOAD STAPLER 3.5X45 BLU DVNC (STAPLE) ×2 IMPLANT
RELOAD STAPLER 4.3X45 GRN DVNC (STAPLE) ×5 IMPLANT
SEAL CANN UNIV 5-8 DVNC XI (MISCELLANEOUS) ×2 IMPLANT
SEAL XI 5MM-8MM UNIVERSAL (MISCELLANEOUS) ×2
SET TRI-LUMEN FLTR TB AIRSEAL (TUBING) ×1 IMPLANT
SOLUTION ELECTROLUBE (MISCELLANEOUS) IMPLANT
SPONGE INTESTINAL PEANUT (DISPOSABLE) IMPLANT
STAPLE RELOAD 45 2.0 GRAY (STAPLE) ×1
STAPLE RELOAD 45 2.0 GRAY DVNC (STAPLE) ×1 IMPLANT
STAPLER 45 SUREFORM CVD (STAPLE) ×1
STAPLER 45 SUREFORM CVD DVNC (STAPLE) IMPLANT
STAPLER CANNULA SEAL DVNC XI (STAPLE) ×2 IMPLANT
STAPLER CANNULA SEAL XI (STAPLE) ×2
STAPLER RELOAD 2.5X45 WHITE (STAPLE) ×2
STAPLER RELOAD 2.5X45 WHT DVNC (STAPLE) ×2
STAPLER RELOAD 3.5X45 BLU DVNC (STAPLE) ×2
STAPLER RELOAD 3.5X45 BLUE (STAPLE) ×2
STAPLER RELOAD 4.3X45 GREEN (STAPLE) ×5
STAPLER RELOAD 4.3X45 GRN DVNC (STAPLE) ×5
STOPCOCK 4 WAY LG BORE MALE ST (IV SETS) ×1 IMPLANT
SUT PDS AB 1 CTX 36 (SUTURE) IMPLANT
SUT SILK  1 MH (SUTURE) ×1
SUT SILK 1 MH (SUTURE) ×1 IMPLANT
SUT SILK 2 0 SH (SUTURE) IMPLANT
SUT SILK 2 0SH CR/8 30 (SUTURE) IMPLANT
SUT VIC AB 1 CTX 36 (SUTURE)
SUT VIC AB 1 CTX36XBRD ANBCTR (SUTURE) IMPLANT
SUT VIC AB 2-0 CT1 27 (SUTURE) ×1
SUT VIC AB 2-0 CT1 TAPERPNT 27 (SUTURE) ×1 IMPLANT
SUT VIC AB 3-0 SH 27 (SUTURE) ×2
SUT VIC AB 3-0 SH 27X BRD (SUTURE) ×3 IMPLANT
SUT VICRYL 0 TIES 12 18 (SUTURE) ×1 IMPLANT
SUT VICRYL 0 UR6 27IN ABS (SUTURE) ×2 IMPLANT
SYR 10ML LL (SYRINGE) ×1 IMPLANT
SYR 20ML LL LF (SYRINGE) ×1 IMPLANT
SYR 50ML LL SCALE MARK (SYRINGE) ×1 IMPLANT
SYSTEM RETRIEVAL ANCHOR 15 (MISCELLANEOUS) IMPLANT
SYSTEM RETRIEVAL ANCHOR 8 (MISCELLANEOUS) IMPLANT
SYSTEM SAHARA CHEST DRAIN ATS (WOUND CARE) ×1 IMPLANT
TAPE CLOTH 4X10 WHT NS (GAUZE/BANDAGES/DRESSINGS) ×1 IMPLANT
TAPE CLOTH SURG 4X10 WHT LF (GAUZE/BANDAGES/DRESSINGS) IMPLANT
TIP APPLICATOR SPRAY EXTEND 16 (VASCULAR PRODUCTS) IMPLANT
TOWEL GREEN STERILE (TOWEL DISPOSABLE) ×1 IMPLANT
TRAY FOLEY MTR SLVR 16FR STAT (SET/KITS/TRAYS/PACK) ×1 IMPLANT
TUBING EXTENTION W/L.L. (IV SETS) ×1 IMPLANT
WATER STERILE IRR 1000ML POUR (IV SOLUTION) ×1 IMPLANT

## 2022-06-11 NOTE — Discharge Instructions (Signed)
Robot-Assisted Thoracic Surgery, Care After The following information offers guidance on how to care for yourself after your procedure. Your health care provider may also give you more specific instructions. If you have problems or questions, contact your health care provider. What can I expect after the procedure? After the procedure, it is common to have: Some pain and aches in the area of your surgical incisions. Pain when breathing in (inhaling) and coughing. Tiredness (fatigue). Trouble sleeping. Constipation. Follow these instructions at home: Medicines Take over-the-counter and prescription medicines only as told by your health care provider. If you were prescribed an antibiotic medicine, take it as told by your health care provider. Do not stop taking the antibiotic even if you start to feel better. Talk with your health care provider about safe and effective ways to manage pain after your procedure. Pain management should fit your specific health needs. Take pain medicine before pain becomes severe. Relieving and controlling your pain will make breathing easier for you. Ask your health care provider if the medicine prescribed to you requires you to avoid driving or using machinery. Eating and drinking Follow instructions from your health care provider about eating or drinking restrictions. These will vary depending on what procedure you had. Your health care provider may recommend: A liquid diet or soft diet for the first few days. Meals that are smaller and more frequent. A diet of fruits, vegetables, whole grains, and low-fat proteins. Limiting foods that are high in fat and processed sugar, including fried or sweet foods. Incision care Follow instructions from your health care provider about how to take care of your incisions. Make sure you: Wash your hands with soap and water for at least 20 seconds before and after you change your bandage (dressing). If soap and water are not  available, use hand sanitizer. Change your dressing as told by your health care provider. Leave stitches (sutures), skin glue, or adhesive strips in place. These skin closures may need to stay in place for 2 weeks or longer. If adhesive strip edges start to loosen and curl up, you may trim the loose edges. Do not remove adhesive strips completely unless your health care provider tells you to do that. Check your incision area every day for signs of infection. Check for: Redness, swelling, or more pain. Fluid or blood. Warmth. Pus or a bad smell. Activity Return to your normal activities as told by your health care provider. Ask your health care provider what activities are safe for you. Ask your health care provider when it is safe for you to drive. Do not lift anything that is heavier than 10 lb (4.5 kg), or the limit that you are told, until your health care provider says that it is safe. Rest as told by your health care provider. Avoid sitting for a long time without moving. Get up to take short walks every 1-2 hours. This is important to improve blood flow and breathing. Ask for help if you feel weak or unsteady. Do exercises as told by your health care provider. Pneumonia prevention  Do deep breathing exercises and cough regularly as directed. This helps clear mucus and opens your lungs. Doing this helps prevent lung infection (pneumonia). If you were given an incentive spirometer, use it as told. An incentive spirometer is a tool that measures how well you are filling your lungs with each breath. Coughing may hurt less if you try to support your chest. This is called splinting. Try one of these when you  cough: Hold a pillow against your chest. Place the palms of both hands on top of your incision area. Do not use any products that contain nicotine or tobacco. These products include cigarettes, chewing tobacco, and vaping devices, such as e-cigarettes. If you need help quitting, ask your  health care provider. Avoid secondhand smoke. General instructions If you have a drainage tube: Follow instructions from your health care provider about how to take care of it. Do not travel by airplane after your tube is removed until your health care provider tells you it is safe. You may need to take these actions to prevent or treat constipation: Drink enough fluid to keep your urine pale yellow. Take over-the-counter or prescription medicines. Eat foods that are high in fiber, such as beans, whole grains, and fresh fruits and vegetables. Limit foods that are high in fat and processed sugars, such as fried or sweet foods. Keep all follow-up visits. This is important. Contact a health care provider if: You have redness, swelling, or more pain around an incision. You have fluid or blood coming from an incision. An incision feels warm to the touch. You have pus or a bad smell coming from an incision. You have a fever. You cannot eat or drink without vomiting. Your pain medicine is not controlling your pain. Get help right away if: You have chest pain. Your heart is beating quickly. You have trouble breathing. You have trouble speaking. You are confused. You feel weak or dizzy, or you faint. These symptoms may represent a serious problem that is an emergency. Do not wait to see if the symptoms will go away. Get medical help right away. Call your local emergency services (911 in the [place] Do not drive yourself to the hospital. Summary Talk with your health care provider about safe and effective ways to manage pain after your procedure. Pain management should fit your specific health needs. Return to your normal activities as told by your health care provider. Ask your health care provider what activities are safe for you. Do deep breathing exercises and cough regularly as directed. This helps to clear mucus and prevent pneumonia. If it hurts to cough, ease pain by holding a pillow  against your chest or by placing the palms of both hands over your incisions. This information is not intended to replace advice given to you by your health care provider. Make sure you discuss any questions you have with your health care provider. Document Revised: 04/12/2020 Document Reviewed: 04/12/2020 Elsevier Patient Education  [employer].

## 2022-06-11 NOTE — Brief Op Note (Signed)
06/11/2022  12:06 PM  PATIENT:  Kimberly Kelly  51 y.o. female  PRE-OPERATIVE DIAGNOSIS:  Pulmonary Nodule  POST-OPERATIVE DIAGNOSIS:  Pulmonary Nodule  PROCEDURE:   XI ROBOTIC ASSISTED THORACOSCOPY-RIGHT LOWER LOBE WEDGE RESECTION, RIGHT LOWER LOBECTOMY  INTERCOSTAL NERVE BLOCK (Right)  SURGEON:  Surgeon(s) and Role:  Lightfoot, Lucile Crater, MD - Primary  PHYSICIAN ASSISTANT: Wynelle Beckmann PA-C  ASSISTANTS: none   ANESTHESIA:   local and general  EBL:  25 mL   BLOOD ADMINISTERED:none  DRAINS:  Right pleural chest tube    LOCAL MEDICATIONS USED:  OTHER Exparel  SPECIMEN:  Source of Specimen:  RLL and lymph nodes  DISPOSITION OF SPECIMEN:  PATHOLOGY  COUNTS CORRECT:  YES  DICTATION: .Dragon Dictation  PLAN OF CARE: Admit to inpatient   PATIENT DISPOSITION:  PACU - hemodynamically stable.   Delay start of Pharmacological VTE agent (>24hrs) due to surgical blood loss or risk of bleeding: no

## 2022-06-11 NOTE — Op Note (Signed)
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 Stehler, PA-C  An experienced assistant was required given the complexity of this surgery and the standard of surgical care. The assistant was needed for exposure, dissection, suctioning, retraction of delicate tissues and sutures, instrument exchange and for overall help during this procedure.    Anesthesia  general EBL: 100 ml Blood Administration: None Specimen: Right lower lobe, hilar and mediastinal nodes  Drains: 28 F argyle chest tube in right chest Counts: correct   Indications: 51 year old female with a 1.4 cm right lower lobe pulmonary nodule.  This has been stable for over a year, however there have been some changes noted within the solid component on the.  PET/CT showed no significant uptake, but the nodule has changed over time.  We talked about the risks and benefits of navigational bronchoscopy with biopsy followed by surgical resection if this is a cancer.  Regardless of the biopsy results she would like this removed that she has also consented to a right robotic assisted thoracoscopy with right lower lobe wedge and possible lobectomy.   Findings: Wedge resections were performed.  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  Operative Technique: After the risks, benefits and alternatives were thoroughly discussed, the patient was brought to the operative theatre.  Anesthesia was induced, and the patient was then placed in a lateral decubitus position and was prepped and draped in  normal sterile fashion.  An appropriate surgical pause was performed, and pre-operative antibiotics were dosed accordingly.  We began by placing our 4 robotic ports in the the 7th intercostal space targeting the hilum of the lung.  A 25mm assistant port was placed in the 9th intercostal space in the anterior axillary line.  The robot was then docked and all instruments were passed under direct visualization.    The wedge resection of the superior segment of the right lower lobe was performed.  The ICG marking helped [place] localize the nodule.  The specimen was removed with an Endo Catch bag.  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  We elected to proceed with a lobectomy.   The lung was then retracted superiorly, and the inferior pulmonary ligament was divided.  The hilum was mobilized anteriorly and posteriorly.  We identified the lower lobe pulmonary vein, and after careful isolation, it was divided with a vascular stapler.  We next moved to the pulmonary artery.  The artery was then divided with a vascular load stapler.  The bronchus to the lower lobe was then isolated.  After a test clamp, with good ventilation of the remaining lung, the bronchus was then divided.  The fissure was completed, and the specimen was passed into an endocatch bag.  It was removed from the anterior access site.    Lymph nodes were then sampled at hilum and mediastinum.  The chest was irrigated, and an air leak test was performed.  An intercostal nerve block was performed under direct visualization.  A 28 F chest tube was then placed, and we watch the remaining lobes re-expand.  The skin and soft tissue were closed with absorbable suture    The patient tolerated the procedure without any immediate complications, and was transferred to the PACU in stable condition.  Kimyah Frein Bary Leriche

## 2022-06-11 NOTE — Anesthesia Procedure Notes (Signed)
Procedure Name: Intubation Date/Time: 06/11/2022 9:10 AM  Performed by: Colin Benton, CRNAPre-anesthesia Checklist: Patient identified, Emergency Drugs available, Suction available and Patient being monitored Patient Re-evaluated:Patient Re-evaluated prior to induction Oxygen Delivery Method: Circle system utilized Preoxygenation: Pre-oxygenation with 100% oxygen Induction Type: Inhalational induction with existing ETT Endobronchial tube: Left, Double lumen EBT and EBT position confirmed by auscultation and 37 Fr Number of attempts: 1 Placement Confirmation: ETT inserted through vocal cords under direct vision, positive ETCO2 and breath sounds checked- equal and bilateral Tube secured with: Tape Dental Injury: Teeth and Oropharynx as per pre-operative assessment  Comments: 3 French L DLT placed over cook airway exchange catheter by Dr. Tobias Alexander.

## 2022-06-11 NOTE — TOC Progression Note (Signed)
Transition of Care [facility]) - Progression Note    Patient Details  Name: STELA IWASAKI MRN: [id number] Date of Birth: 08/14/1970  [facility]) CM/SW Contact  Zenon Mayo, RN Phone Number: 06/11/2022, 4:04 PM  Clinical Narrative:    from home, s/p robotic lobectomy, chest tube in place.  TOC following.        Expected Discharge Plan and Services                                                 Social Determinants of Health (SDOH) Interventions    Readmission Risk Interventions     No data to display

## 2022-06-11 NOTE — Interval H&P Note (Signed)
History and Physical Interval Note:  06/11/2022 7:19 AM  Kimberly Kelly  has presented today for surgery, with the diagnosis of Pulmonary Nodule.  The various methods of treatment have been discussed with the patient and family. After consideration of risks, benefits and other options for treatment, the patient has consented to  Procedure(s): XI ROBOTIC ASSISTED THORACOSCOPY-RIGHT LOWER LOBE WEDGE RESECTION, POSSIBLE LOBECTOMY (Right) as a surgical intervention.  The patient's history has been reviewed, patient examined, no change in status, stable for surgery.  I have reviewed the patient's chart and labs.  Questions were answered to the patient's satisfaction.     [name]

## 2022-06-11 NOTE — Anesthesia Procedure Notes (Signed)
Procedure Name: Intubation Date/Time: 06/11/2022 7:44 AM  Performed by: Colin Benton, CRNAPre-anesthesia Checklist: Patient identified, Emergency Drugs available, Suction available and Patient being monitored Patient Re-evaluated:Patient Re-evaluated prior to induction Oxygen Delivery Method: Circle system utilized Preoxygenation: Pre-oxygenation with 100% oxygen Induction Type: IV induction Ventilation: Mask ventilation without difficulty Laryngoscope Size: Miller and 2 Grade View: Grade II Tube type: Oral Tube size: 8.5 mm Number of attempts: 3 Airway Equipment and Method: Stylet Placement Confirmation: ETT inserted through vocal cords under direct vision, positive ETCO2 and breath sounds checked- equal and bilateral Secured at: 23 cm Tube secured with: Tape Dental Injury: Teeth and Oropharynx as per pre-operative assessment  Comments: DL x 1 with Mil 2 by CRNA.  Grade 2 view.  Unable to pass ETT up through cords anteriorly due to size.  Dr. Tobias Alexander with attempt and Mil 2.  Successful intubation with Mil 2.  EBBS and VSS.

## 2022-06-11 NOTE — H&P (Signed)
Kimberly Kelly is an 51 y.o. female.   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 No dyspnea, CP, cough.   Past Medical History:  Diagnosis Date   Anxiety    Arthritis    Complication of anesthesia    Certain medication that gives her general myalgia   Necrotizing pancreatitis 2022    Past Surgical History:  Procedure Laterality Date   BIOPSY  02/07/2021   Procedure: BIOPSY;  Surgeon: Rush Landmark Telford Nab., MD;  Location: [facility];  Service: Gastroenterology;;   CESAREAN SECTION     CHOLECYSTECTOMY N/A 10/10/2016   Procedure: LAPAROSCOPIC CHOLECYSTECTOMY WITH INTRAOPERATIVE CHOLANGIOGRAM;  Surgeon: Erroll Luna, MD;  Location: [facility];  Service: General;  Laterality: N/A;   ERCP     ESOPHAGOGASTRODUODENOSCOPY (EGD) WITH PROPOFOL N/A 02/01/2021   Procedure: ESOPHAGOGASTRODUODENOSCOPY (EGD) WITH PROPOFOL;  Surgeon: Clarene Essex, MD;  Location: [facility];  Service: Endoscopy;  Laterality: N/A;   ESOPHAGOGASTRODUODENOSCOPY (EGD) WITH PROPOFOL N/A 02/07/2021   Procedure: ESOPHAGOGASTRODUODENOSCOPY (EGD) WITH PROPOFOL;  Surgeon: Rush Landmark Telford Nab., MD;  Location: [place] [facility];  Service: Gastroenterology;  Laterality: N/A;   SHOULDER ARTHROSCOPY     TUBAL LIGATION     UPPER ESOPHAGEAL ENDOSCOPIC ULTRASOUND (EUS) Left 02/07/2021   Procedure: UPPER ESOPHAGEAL ENDOSCOPIC ULTRASOUND (EUS);  Surgeon: Irving Copas., MD;  Location: [facility];  Service: Gastroenterology;  Laterality: Left;    Family History  Problem Relation Age of Onset   Breast cancer Paternal Grandmother    Social History:  reports that she has never smoked. She has never used smokeless tobacco. She reports current alcohol use. She reports that she does not use drugs.  Allergies: No Known Allergies  Medications Prior to Admission  Medication Sig Dispense Refill   ALPRAZolam (XANAX) 0.5 MG tablet Take 0.5 mg by  mouth at bedtime as needed for anxiety or sleep.     escitalopram (LEXAPRO) 10 MG tablet Take 10 mg by mouth daily.     methocarbamol (ROBAXIN) 500 MG tablet Take 500 mg by mouth every 6 (six) hours as needed for muscle spasms.      Results for orders placed or performed during the hospital encounter of 06/11/22 (from the past 48 hour(s))  ABO/Rh     Status: None (Preliminary result)   Collection Time: 06/11/22  6:50 AM  Result Value Ref Range   ABO/RH(D) PENDING    No results found.  12 point review of systems is negative except as in HPI  Blood pressure 134/84, pulse 93, temperature 98 F (36.7 C), temperature source Oral, resp. rate 18, height 5\' 7"  (1.702 m), weight 87.1 kg, last menstrual period 02/14/2020, SpO2 100 %.  PE: General appearance: 51 y.o., female, NAD, conversant  Eyes: anicteric sclerae; PERRL, tracking appropriately HENT: NCAT; MMM Neck: Trachea midline; no lymphadenopathy, no JVD Lungs: CTAB, no crackles, no wheeze, with normal respiratory effort CV: RRR, no murmur  Abdomen: Soft, non-tender; non-distended, BS present  Extremities: No peripheral edema, warm Skin: Normal turgor and texture; no rash Psych: Appropriate affect Neuro: Alert and oriented to person and place, no focal deficit    Assessment/Plan # RLL subsolid nodule - nav with dye-marking and biopsy under general   Kimberly Hurter, MD 06/11/2022, 7:09 AM

## 2022-06-11 NOTE — Plan of Care (Signed)
  Problem: Education: Goal: Knowledge of disease or condition will improve Outcome: Progressing Goal: Knowledge of the prescribed therapeutic regimen will improve Outcome: Progressing   Problem: Activity: Goal: Risk for activity intolerance will decrease Outcome: Progressing   Problem: Cardiac: Goal: Will achieve and/or maintain hemodynamic stability Outcome: Progressing   Problem: Clinical Measurements: Goal: Postoperative complications will be avoided or minimized Outcome: Progressing   Problem: Respiratory: Goal: Respiratory status will improve Outcome: Progressing   Problem: Pain Management: Goal: Pain level will decrease Outcome: Progressing   Problem: Clinical Measurements: Goal: Will remain free from infection Outcome: Progressing

## 2022-06-11 NOTE — Anesthesia Postprocedure Evaluation (Signed)
Anesthesia Post Note  Patient: Kimberly Kelly  Procedure(s) Performed: ROBOTIC ASSISTED NAVIGATIONAL BRONCHOSCOPY BRONCHIAL NEEDLE ASPIRATION BIOPSIES BRONCHIAL BRUSHINGS BRONCHIAL BIOPSIES FIDUCIAL DYE MARKING XI ROBOTIC ASSISTED THORACOSCOPY-RIGHT LOWER LOBE WEDGE RESECTION, POSSIBLE LOBECTOMY (Right: Chest) INTERCOSTAL NERVE BLOCK (Right: Chest)     Patient location during evaluation: PACU Anesthesia Type: General Level of consciousness: sedated Pain management: pain level controlled Vital Signs Assessment: post-procedure vital signs reviewed and stable Respiratory status: spontaneous breathing and respiratory function stable Cardiovascular status: stable Postop Assessment: no apparent nausea or vomiting Anesthetic complications: no   No notable events documented.  Last Vitals:  Vitals:   06/11/22 1330 06/11/22 1345  BP: 117/72 118/78  Pulse: 93 96  Resp: 14 15  Temp:    SpO2: 99% 99%                    Rakia Frayne DANIEL

## 2022-06-11 NOTE — Transfer of Care (Signed)
Immediate Anesthesia Transfer of Care Note  Patient: Kimberly Kelly  Procedure(s) Performed: ROBOTIC ASSISTED NAVIGATIONAL BRONCHOSCOPY BRONCHIAL NEEDLE ASPIRATION BIOPSIES BRONCHIAL BRUSHINGS BRONCHIAL BIOPSIES FIDUCIAL DYE MARKING  Patient Location: PACU  Anesthesia Type:General  Level of Consciousness: drowsy and patient cooperative  Airway & Oxygen Therapy: Patient Spontanous Breathing and Patient connected to face mask oxygen  Post-op Assessment: Report given to RN, Post -op Vital signs reviewed and stable, and Patient moving all extremities X 4  Post vital signs: Reviewed and stable  Last Vitals:  Vitals Value Taken Time  BP 116/80 06/11/22 1223  Temp    Pulse 96 06/11/22 1226  Resp 15 06/11/22 1226  SpO2 100 % 06/11/22 1226  Vitals shown include unvalidated device data.  Last Pain:  Vitals:   06/11/22 0629  TempSrc:   PainSc: 0-No pain      Patients Stated Pain Goal: 0 (33/00/76 2263)  Complications: No notable events documented.

## 2022-06-11 NOTE — Op Note (Signed)
Video Bronchoscopy with Robotic Assisted Bronchoscopic Navigation   Date of Operation: 06/11/2022   Pre-op Diagnosis: RLL nodule  Post-op Diagnosis: RLL nodule  Surgeon: Walker Shadow  Anesthesia: General endotracheal anesthesia  Operation: Flexible video fiberoptic bronchoscopy with robotic assistance and biopsies.  Estimated Blood Loss: Minimal  Complications: None  Indications and History: Kimberly Kelly is a 51 y.o. female with history of never smoker, persistent RLL subsolid nodule. The risks, benefits, complications, treatment options and expected outcomes were discussed with the patient.  The possibilities of pneumothorax, pneumonia, reaction to medication, pulmonary aspiration, perforation of a viscus, bleeding, failure to diagnose a condition and creating a complication requiring transfusion or operation were discussed with the patient who freely signed the consent.    Description of Procedure: The patient was seen in the Preoperative Area, was examined and was deemed appropriate to proceed.  The patient was taken to [facility] endoscopy room 3, identified as Kimberly Kelly and the procedure verified as Flexible Video Fiberoptic Bronchoscopy.  A Time Out was held and the above information confirmed.   Prior to the date of the procedure a high-resolution CT scan of the chest was performed. Utilizing ION software program a virtual tracheobronchial tree was generated to allow the creation of distinct navigation pathways to the patient's parenchymal abnormalities. After being taken to the operating room general anesthesia was initiated and the patient  was orally intubated. The video fiberoptic bronchoscope was introduced via the endotracheal tube and a general inspection was performed which showed normal right and left lung anatomy, aspiration of the bilateral mainstems was completed to remove any remaining secretions. Robotic catheter inserted into patient's endotracheal tube.   Target #1 RLL  nodule: The distinct navigation pathways prepared prior to this procedure were then utilized to navigate to patient's lesion identified on CT scan. CIOS imaging was used to aid navigation and confirm ideal location for biopsy. The robotic catheter was secured into place and the vision probe was withdrawn.  Lesion location was approximated using fluoroscopy. Under fluoroscopic guidance transbronchial needle brushings, transbronchial needle biopsies, and transbronchial forceps biopsies were performed to be sent for cytology and pathology. The position of the lesion was then marked with dye.       Samples Target #1: 1. Transbronchial needle brushings from RLL nodule 2. Transbronchial Wang needle biopsies from RLL nodule 3. Transbronchial forceps biopsies from RLL nodule 4. Endobronchial biopsies from RLL nodule   Plans:  The patient will be transferred from the PACU to the hospital after RATS wedge resection vs lobectomy. We will review the cytology, pathology and microbiology results with the patient when they become available. Outpatient followup will be with Dr. Valeta Harms and Dr. Kipp Brood.

## 2022-06-12 ENCOUNTER — Inpatient Hospital Stay (HOSPITAL_COMMUNITY): Payer: 59

## 2022-06-12 ENCOUNTER — Encounter (HOSPITAL_COMMUNITY): Payer: Self-pay | Admitting: Thoracic Surgery (Cardiothoracic Vascular Surgery)

## 2022-06-12 LAB — BASIC METABOLIC PANEL
Anion gap: 6 (ref 5–15)
BUN: 17 mg/dL (ref 6–20)
CO2: 25 mmol/L (ref 22–32)
Calcium: 8.6 mg/dL — ABNORMAL LOW (ref 8.9–10.3)
Chloride: 105 mmol/L (ref 98–111)
Creatinine, Ser: 0.94 mg/dL (ref 0.44–1.00)
GFR, Estimated: >60 mL/min (ref >60)
Glucose, Bld: 145 mg/dL — ABNORMAL HIGH (ref 70–99)
Potassium: 4.3 mmol/L (ref 3.5–5.1)
Sodium: 136 mmol/L (ref 135–145)

## 2022-06-12 LAB — CBC
HCT: 34.8 % — ABNORMAL LOW (ref 36.0–46.0)
Hemoglobin: 11.6 g/dL — ABNORMAL LOW (ref 12.0–15.0)
MCH: 27.8 pg (ref 26.0–34.0)
MCHC: 33.3 g/dL (ref 30.0–36.0)
MCV: 83.3 fL (ref 80.0–100.0)
Platelets: 127 10*3/uL — ABNORMAL LOW (ref 150–400)
RBC: 4.18 MIL/uL (ref 3.87–5.11)
RDW: 13.3 % (ref 11.5–15.5)
WBC: 6.5 10*3/uL (ref 4.0–10.5)
nRBC: 0 % (ref 0.0–0.2)

## 2022-06-12 MED ORDER — OXYCODONE HCL 5 MG PO TABS
5.0000 mg | ORAL_TABLET | ORAL | Status: DC | PRN
  Administered 2022-06-12 (×2): 5 mg via ORAL
  Filled 2022-06-12 (×2): qty 1

## 2022-06-12 MED ORDER — OXYCODONE HCL 5 MG PO TABS: 5.0000 mg | ORAL_TABLET | Freq: Four times a day (QID) | ORAL | 0 refills | Status: AC | PRN

## 2022-06-12 NOTE — Progress Notes (Addendum)
      [address]       [place] [address]             [phone]       1 Day Post-Op Procedure(s) (LRB): XI ROBOTIC ASSISTED THORACOSCOPY-RIGHT LOWER LOBE WEDGE RESECTION, POSSIBLE LOBECTOMY (Right) INTERCOSTAL NERVE BLOCK (Right)  Subjective: Patient with right shoulder pain (chest tube)  Objective: Vital signs in last 24 hours: Temp:  [97.6 F (36.4 C)-98.3 F (36.8 C)] 97.6 F (36.4 C) (11/10 0438) Pulse Rate:  [79-101] 79 (11/10 0438) Cardiac Rhythm: Normal sinus rhythm (11/10 0710) Resp:  [10-17] 16 (11/10 0438) BP: (100-118)/(64-80) 104/74 (11/10 0438) SpO2:  [94 %-99 %] 94 % (11/10 0438)      Intake/Output from previous day: 11/09 0701 - 11/10 0700 In: 1350 [I.V.:1150; IV Piggyback:200] Out: 1105 [Urine:840; Blood:25; Chest Tube:240]   Physical Exam:  Cardiovascular: RRR Pulmonary: Clear to auscultation on left and rub on right (chest tube in place) Abdomen: Soft, non tender, bowel sounds present. Extremities: SCDs in place Wounds: Clean and dry.  No erythema or signs of infection. Chest Tube: to water seal, small air leak with cough  Lab Results: CBC: Recent Labs    06/09/22 0930 06/12/22 0013  WBC 3.6* 6.5  HGB 12.9 11.6*  HCT 38.8 34.8*  PLT 159 127*   BMET:  Recent Labs    06/09/22 0930 06/12/22 0013  NA 141 136  K 3.8 4.3  CL 105 105  CO2 26 25  GLUCOSE 118* 145*  BUN 16 17  CREATININE 0.83 0.94  CALCIUM 9.2 8.6*    PT/INR:  Recent Labs    06/09/22 0930  LABPROT 14.4  INR 1.1   ABG:  INR: Will add last result for INR, ABG once components are confirmed Will add last 4 CBG results once components are confirmed  Assessment/Plan:  1. CV - SR. 2.  Pulmonary - On room air. Chest tube with 240 cc since surgery. Chest tube is to water seal, small air leak with cough. CXR this am appears stable. Chest tube to remain for now. Encourage incentive spirometer. Await final pathology. 3. Expected post op blood  loss anemia-H and Ht his am slightly decreased to 11.6 and 34.8 4. Mild thrombocytopenia-platelets this am 127,000 5. On Lovenox for DVT prophylaxis 6. Regarding pain control, will stop Ultram as not helping and give Oxy PRN. Continue scheduled Toradol and MS PRN  Hiroshi Krummel M ZimmermanPA-C 06/12/2022,7:37 AM

## 2022-06-12 NOTE — Plan of Care (Signed)

## 2022-06-12 NOTE — Hospital Course (Addendum)
History of Present Illness:    Kimberly Kelly 51 y.o. female referred for surgical evaluation of a right lower lobe pulmonary nodule.  Has been followed for over a year, and there has been some change noted within the solid component of it.  She is a lifelong non-smoker.  This was originally identified when she was being worked up for gallstone pancreatitis.  She since has recovered from that after having extensive hospitalization.  She also has a history of a cystic lesion in her breast which has been followed with routine mammograms.   She denies any neurologic symptoms or shortness of breath.  Her weight has been stable. She works out regularly.  Dr. Kipp Brood reviewed the patient's chart, labs and diagnsotic studies and determined the robotic assisted right lower lobe wedge resection with possible lobectomy would provide this patient the best long term treatment. He reviewed the treatment options with Kimberly Kelly and the risks and benefits of surgery. Kimberly Kelly was agreeable to proceed with surgery.   Hospital Course: Kimberly Kelly presented to [facility] and was brought to the operating room on 06/11/22. 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 She tolerated the procedure well and was brought to the PACU in stable condition. Her chest tube had a small air leak and was left in place on POD1. However, after being examined by Dr. Kipp Brood, it was decided to remove the chest tube as there did not appear to be an air leak. Follow up CXR showed trace hydropneumothorax on right. Her pain was not well controlled so tramadol was discontinued and oxycodone was started PRN. She is tolerating a diet. She is ambulating on room air with good oxygenation. All wounds are clean, dry, and healing without signs of infection. She is felt surgically stable for discharge today.

## 2022-06-12 NOTE — Discharge Summary (Signed)
[address]       [place] [address]             [phone]    Physician Discharge Summary  Patient ID: Kimberly Kelly MRN: [id number] DOB/AGE: March 09, 1971 51 y.o.  Admit date: 06/11/2022 Discharge date: 06/13/2022  Admission Diagnoses:  Patient Active Problem List   Diagnosis Date Noted   Lung nodule 06/11/2022   Right lower lobe pulmonary nodule 06/11/2022   Pancreatitis 01/31/2021   Hypokalemia 01/08/2021   Thrombocytosis 01/08/2021   Leukocytosis 01/08/2021   Bradycardia 01/08/2021   Depression 01/08/2021   Anxiety 01/08/2021   Acute pancreatitis 01/07/2021   S/P ERCP 01/07/2021   Acute urinary retention 01/07/2021   Pulmonary nodule 12/27/2020   Symptomatic cholelithiasis 10/10/2016     Discharge Diagnoses:  S/p Xi robotic assisted right thoracoscopy, wedge RLL, RLL, mediastinal LN sampling, intercostal nerve block Patient Active Problem List   Diagnosis Date Noted   Lung nodule 06/11/2022   Right lower lobe pulmonary nodule 06/11/2022   Pancreatitis 01/31/2021   Hypokalemia 01/08/2021   Thrombocytosis 01/08/2021   Leukocytosis 01/08/2021   Bradycardia 01/08/2021   Depression 01/08/2021   Anxiety 01/08/2021   Acute pancreatitis 01/07/2021   S/P ERCP 01/07/2021   Acute urinary retention 01/07/2021   Pulmonary nodule 12/27/2020   Symptomatic cholelithiasis 10/10/2016     Discharged Condition: stable  History of Present Illness:    Kimberly Kelly 51 y.o. female referred for surgical evaluation of a right lower lobe pulmonary nodule.  Has been followed for over a year, and there has been some change noted within the solid component of it.  She is a lifelong non-smoker.  This was originally identified when she was being worked up for gallstone pancreatitis.  She since has recovered from that after having extensive hospitalization.  She also has a history of a cystic lesion in her breast which has been followed with routine mammograms.    She denies any neurologic symptoms or shortness of breath.  Her weight has been stable. She works out regularly.  Dr. Kipp Brood reviewed the patient's chart, labs and diagnsotic studies and determined the robotic assisted right lower lobe wedge resection with possible lobectomy would provide this patient the best long term treatment. He reviewed the treatment options with Kimberly Kelly and the risks and benefits of surgery. Kimberly Kelly was agreeable to proceed with surgery.   Hospital Course: Kimberly Kelly presented to [facility] (1-Rh) and was brought to the operating room on 06/11/22. 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 She tolerated the procedure well and was brought to the PACU in stable condition. Her chest tube had a small air leak and was left in place on POD1. However, after being examined by Dr. Kipp Brood, it was decided to remove the chest tube as there did not appear to be an air leak. Follow up CXR showed trace hydropneumothorax on right. Her pain was not well controlled so tramadol was discontinued and oxycodone was started PRN. She is tolerating a diet. She is ambulating on room air with good oxygenation. All wounds are clean, dry, and healing without signs of infection. She is felt surgically stable for discharge today.   Consults: None  Significant Diagnostic Studies:   Narrative & Impression  CLINICAL DATA:  Chest tube status post surgery.   EXAM: PORTABLE CHEST 1 VIEW   COMPARISON:  June 11, 2022.   FINDINGS: Stable cardiomediastinal silhouette. Stable position of right-sided chest tube without definite pneumothorax. Minimal right basilar subsegmental atelectasis is noted. Left lung is clear. Bony thorax unremarkable.   IMPRESSION: Stable position of right-sided chest tube without definite pneumothorax.     Electronically Signed   By: Marijo Conception  M.D.   On: 06/12/2022 08:39   Narrative & Impression  CLINICAL DATA:  RIGHT LOWER lobe pulmonary nodule. Status post surgery.   EXAM: PORTABLE CHEST 1 VIEW   COMPARISON:  06/09/2022   FINDINGS: Interval placement of RIGHT-sided chest tube. Small pneumothorax suspected at the RIGHT lung base. Minimal streaky atelectasis in the RIGHT LOWER lobe. LEFT lung is clear. No pulmonary edema.   IMPRESSION: Interval placement of RIGHT-sided chest tube. Small pneumothorax suspected at the RIGHT lung base.     Electronically Signed   By: Nolon Nations M.D.   On: 06/11/2022 13:41    Treatments: surgery:  Xi robotic assisted right thoracoscopy, wedge RLL, RLL, mediastinal Lymph node sampling, and intercostal nerve block by Dr. Tyson Alias on 06/12/2022.  Pathology: Pending   Discharge Exam: Blood pressure 108/66, pulse 81, temperature 97.7 F (36.5 C), temperature source Oral, resp. rate 14, height 5\' 7"  (1.702 m), weight 87.1 kg, last menstrual period 02/14/2020, SpO2 96 %.  General appearance: alert, cooperative, and no distress Heart: regular rate and rhythm Lungs: diminished breath sounds right base  Wound: clean and dry  Discharge Medications:  Cardiovascular: RRR Pulmonary: Clear to auscultation on left and rub on right (chest tube in place) Abdomen: Soft, non tender, bowel sounds present. Extremities: SCDs in place Wounds: Clean and dry.  No erythema or signs of infection.   Allergies as of 06/13/2022   No Known Allergies      Medication List     TAKE these medications    ALPRAZolam 0.5 MG tablet Commonly known as: XANAX Take 0.5 mg by mouth at bedtime as needed for anxiety or sleep.   escitalopram 10 MG tablet Commonly known as: LEXAPRO Take 10 mg by mouth daily.   methocarbamol 500 MG tablet Commonly known as: ROBAXIN Take 500 mg by mouth every 6 (six) hours as needed for muscle spasms.   oxyCODONE 5 MG immediate release tablet Commonly known as:  Oxy IR/ROXICODONE Take 1 tablet (5 mg total) by mouth every 6 (six) hours as needed for up to 7 days for severe pain.        Follow-up Information     Lajuana Matte, MD. Go on 06/19/2022.   Specialty: Cardiothoracic Surgery Why: Appointment time is at 11:50 am Contact information: [address] [place] [address] [phone]                 Signed:  Ellwood Handler, PA-C 06/13/2022, 12:04 PM

## 2022-06-12 NOTE — Progress Notes (Signed)
Mobility Specialist Progress Note    06/12/22 1002  Mobility  Activity Ambulated with assistance in hallway  Level of Assistance Standby assist, set-up cues, supervision of patient - no hands on  Assistive Device Front wheel walker  Distance Ambulated (ft) 800 ft  Activity Response Tolerated well  Mobility Referral Yes  $Mobility charge 1 Mobility   Pre-Mobility: 81 HR, 124/89 (99) BP, 97% SpO2 During Mobility: 107 HR, >/=88% SpO2 Post-Mobility: 93 HR, 95% SpO2  Pt received in bed and agreeable. C/o shoulder and back pain. Returned to standing at sink with family present.   Hildred Alamin Mobility Specialist  Please Psychologist, sport and exercise or Rehab Office at [phone]

## 2022-06-13 ENCOUNTER — Inpatient Hospital Stay (HOSPITAL_COMMUNITY): Payer: 59

## 2022-06-13 LAB — COMPREHENSIVE METABOLIC PANEL
ALT: 55 U/L — ABNORMAL HIGH (ref 0–44)
AST: 47 U/L — ABNORMAL HIGH (ref 15–41)
Albumin: 2.8 g/dL — ABNORMAL LOW (ref 3.5–5.0)
Alkaline Phosphatase: 150 U/L — ABNORMAL HIGH (ref 38–126)
Anion gap: 7 (ref 5–15)
BUN: 23 mg/dL — ABNORMAL HIGH (ref 6–20)
CO2: 24 mmol/L (ref 22–32)
Calcium: 8.6 mg/dL — ABNORMAL LOW (ref 8.9–10.3)
Chloride: 108 mmol/L (ref 98–111)
Creatinine, Ser: 1.17 mg/dL — ABNORMAL HIGH (ref 0.44–1.00)
GFR, Estimated: 56 mL/min — ABNORMAL LOW (ref >60)
Glucose, Bld: 185 mg/dL — ABNORMAL HIGH (ref 70–99)
Potassium: 4 mmol/L (ref 3.5–5.1)
Sodium: 139 mmol/L (ref 135–145)
Total Bilirubin: 0.7 mg/dL (ref 0.3–1.2)
Total Protein: 5.3 g/dL — ABNORMAL LOW (ref 6.5–8.1)

## 2022-06-13 LAB — CBC
HCT: 34.3 % — ABNORMAL LOW (ref 36.0–46.0)
Hemoglobin: 11.4 g/dL — ABNORMAL LOW (ref 12.0–15.0)
MCH: 27.9 pg (ref 26.0–34.0)
MCHC: 33.2 g/dL (ref 30.0–36.0)
MCV: 83.9 fL (ref 80.0–100.0)
Platelets: 124 10*3/uL — ABNORMAL LOW (ref 150–400)
RBC: 4.09 MIL/uL (ref 3.87–5.11)
RDW: 13.5 % (ref 11.5–15.5)
WBC: 5.1 10*3/uL (ref 4.0–10.5)
nRBC: 0 % (ref 0.0–0.2)

## 2022-06-13 MED ORDER — ALBUMIN HUMAN 5 % IV SOLN
12.5000 g | Freq: Once | INTRAVENOUS | Status: AC
  Administered 2022-06-13: 12.5 g via INTRAVENOUS
  Filled 2022-06-13: qty 250

## 2022-06-13 NOTE — Plan of Care (Signed)
  Problem: Education: Goal: Knowledge of disease or condition will improve Outcome: Completed/Met Goal: Knowledge of the prescribed therapeutic regimen will improve Outcome: Completed/Met   Problem: Activity: Goal: Risk for activity intolerance will decrease Outcome: Completed/Met   Problem: Cardiac: Goal: Will achieve and/or maintain hemodynamic stability Outcome: Completed/Met   Problem: Clinical Measurements: Goal: Postoperative complications will be avoided or minimized Outcome: Completed/Met   Problem: Respiratory: Goal: Respiratory status will improve Outcome: Completed/Met   Problem: Pain Management: Goal: Pain level will decrease Outcome: Completed/Met   Problem: Skin Integrity: Goal: Wound healing without signs and symptoms infection will improve Outcome: Completed/Met   Problem: Education: Goal: Knowledge of General Education information will improve Description: Including pain rating scale, medication(s)/side effects and non-pharmacologic comfort measures Outcome: Completed/Met   Problem: Health Behavior/Discharge Planning: Goal: Ability to manage health-related needs will improve Outcome: Completed/Met   Problem: Clinical Measurements: Goal: Ability to maintain clinical measurements within normal limits will improve Outcome: Completed/Met Goal: Will remain free from infection Outcome: Completed/Met Goal: Diagnostic test results will improve Outcome: Completed/Met Goal: Respiratory complications will improve Outcome: Completed/Met Goal: Cardiovascular complication will be avoided Outcome: Completed/Met   Problem: Activity: Goal: Risk for activity intolerance will decrease Outcome: Completed/Met   Problem: Nutrition: Goal: Adequate nutrition will be maintained Outcome: Completed/Met   Problem: Coping: Goal: Level of anxiety will decrease Outcome: Completed/Met   Problem: Elimination: Goal: Will not experience complications related to bowel  motility Outcome: Completed/Met Goal: Will not experience complications related to urinary retention Outcome: Completed/Met   Problem: Pain Managment: Goal: General experience of comfort will improve Outcome: Completed/Met   Problem: Safety: Goal: Ability to remain free from injury will improve Outcome: Completed/Met   Problem: Skin Integrity: Goal: Risk for impaired skin integrity will decrease Outcome: Completed/Met

## 2022-06-13 NOTE — Progress Notes (Addendum)
      [address]       [place] [address]             [phone]      2 Days Post-Op Procedure(s) (LRB): XI ROBOTIC ASSISTED THORACOSCOPY-RIGHT LOWER LOBE WEDGE RESECTION, POSSIBLE LOBECTOMY (Right) INTERCOSTAL NERVE BLOCK (Right)  Subjective:  Patient without complaints.  Surprised how good she feels.  Denies shortness of breath.  Objective: Vital signs in last 24 hours: Temp:  [97.6 F (36.4 C)-98.2 F (36.8 C)] 97.7 F (36.5 C) (11/11 1117) Pulse Rate:  [72-109] 81 (11/11 1117) Cardiac Rhythm: Sinus tachycardia (11/10 1902) Resp:  [14-18] 14 (11/11 1117) BP: (81-110)/(52-73) 108/66 (11/11 1117) SpO2:  [94 %-96 %] 96 % (11/11 1117)  Intake/Output from previous day: 11/10 0701 - 11/11 0700 In: 18.7 [IV Piggyback:18.7] Out: 0   General appearance: alert, cooperative, and no distress Heart: regular rate and rhythm Lungs: diminished breath sounds right base  Wound: clean and dry  Lab Results: Recent Labs    06/12/22 0013 06/13/22 0013  WBC 6.5 5.1  HGB 11.6* 11.4*  HCT 34.8* 34.3*  PLT 127* 124*   BMET:  Recent Labs    06/12/22 0013 06/13/22 0013  NA 136 139  K 4.3 4.0  CL 105 108  CO2 25 24  GLUCOSE 145* 185*  BUN 17 23*  CREATININE 0.94 1.17*  CALCIUM 8.6* 8.6*    PT/INR: No results for input(s): "LABPROT", "INR" in the last 72 hours. ABG No results found for: "PHART", "HCO3", "TCO2", "ACIDBASEDEF", "O2SAT" CBG (last 3)  No results for input(s): "GLUCAP" in the last 72 hours.  Assessment/Plan: S/P Procedure(s) (LRB): XI ROBOTIC ASSISTED THORACOSCOPY-RIGHT LOWER LOBE WEDGE RESECTION, POSSIBLE LOBECTOMY (Right) INTERCOSTAL NERVE BLOCK (Right)  Chest tube removed yesterday, trace hydropneumothorax Pain control- improved with oxy, better after chest tube removed CV- hemodynamically stable  Home today   LOS: 2 days    Ellwood Handler, PA-C 06/13/2022  Agree with above. CXR looks ok. Feels well. Plan home today.

## 2022-06-13 NOTE — Progress Notes (Signed)
Pt got discharged to home, discharge instructions provided and patient showed understanding to it, IV taken out,Telemonitor DC,pt left unit in wheelchair with all of the belongings accompanied with a family members (Husband and Father)  Palma Holter, Therapist, sports

## 2022-06-14 ENCOUNTER — Encounter (HOSPITAL_COMMUNITY): Payer: Self-pay | Admitting: Student

## 2022-06-15 LAB — SURGICAL PATHOLOGY

## 2022-06-18 NOTE — Progress Notes (Signed)
      [address]       [place] [address]             [phone]        Markell J [facility] [place] Medical Record #[id number] Date of Birth: [birth year]  Referring: Maryjane Hurter, MD Primary Care: [name], FNP Primary Cardiologist:None  Reason for visit:   follow-up  History of Present Illness:     51 year old female presents for 1 week follow-up appointment.  She does complain of some incisional pain.  She denies any shortness of breath.  Physical Exam: BP 131/84 (BP Location: Left Arm, Patient Position: Sitting)   Pulse 81   Resp 18   Ht 5\' 7"  (1.702 m)   Wt 191 lb (86.6 kg)   LMP 02/14/2020   SpO2 96% Comment: RA  BMI 29.91 kg/m   Alert NAD Incision clean.   Abdomen, ND No peripheral edema   Diagnostic Studies & Laboratory data:  Path: FINAL MICROSCOPIC DIAGNOSIS:  A. RIGHT LUNG, LOWER LOBE, NODULE, WEDGE RESECTION: Invasive moderately differentiated adenocarcinoma Tumor measures 1.1 x 0.8 x 0.7 cm (pT1b) Margins free  B. LYMPH NODE, STATION 7, EXCISION: One benign lymph node, negative for carcinoma (0/1)  C.  RIGHT HILAR LYMPH NODE, EXCISION: One benign lymph node, negative for carcinoma (0/1)  D. LYMPH NODE, STATION 7 #2, EXCISION: One benign lymph node, negative for carcinoma (0/1)  E. RIGHT LUNG, LOWER LOBE, LOBECTOMY: Negative for residual carcinoma Bronchial margin free of tumor Seven lobar lymph nodes negative for carcinoma (0/7)  ONCOLOGY TABLE:  LUNG: Resection  Synchronous Tumors: Not applicabl] Total Number of Primary Tumors: 1 Procedure: Wedge resection with subsequent lobectomy and node sampling Specimen Laterality: Right Tumor Focality: Unifocal Tumor Site: Lower lobe Tumor Size:      Total Tumor Size: 1.1 x 0.8 x 0.7 cm      Invasive Tumor Size: 1.0 x 0.7 x 0.6 cm Histologic Type: Nonmucinous adenocarcinoma, acinar type Visceral Pleura Invasion: Not identified Direct Invasion of Adjacent  Structures: No adjacent structures present Lymphovascular Invasion: Not identified Margins: All margins negative for invasive carcinoma      Closest Margin(s) to Invasive Carcinoma: Bronchial margin      Margin(s) Involved by Invasive Carcinoma: Not applicable       Margin Status for Non-Invasive Tumor: Not applicable Treatment Effect: No known presurgical therapy Regional Lymph Nodes:      Number of Lymph Nodes Involved: 0                           Nodal Sites with Tumor: Not applicable      Number of Lymph Nodes Examined: 10                      Nodal Sites Examined: Station 7,10R and 12R Distant Metastasis: Not applicable Pathologic Stage Classification (pTNM, AJCC 8th Edition): pT1b, pN0     Assessment / Plan:   51 year old female status post a right lower lobectomy for T1b N0 M0 stage I adenocarcinoma.  Overall she is doing well.  She will meet with medical oncology to establish care.  She will follow-up in 1 month with a chest x-ray.   Kimberly Kelly 06/20/2022 5:25 PM

## 2022-06-19 ENCOUNTER — Ambulatory Visit (INDEPENDENT_AMBULATORY_CARE_PROVIDER_SITE_OTHER): Payer: Self-pay | Admitting: Thoracic Surgery (Cardiothoracic Vascular Surgery)

## 2022-06-19 VITALS — BP 131/84 | HR 81 | Resp 18 | Ht 67.0 in | Wt 191.0 lb

## 2022-06-19 DIAGNOSIS — Z902 Acquired absence of lung [part of]: Secondary | ICD-10-CM

## 2022-06-19 DIAGNOSIS — R911 Solitary pulmonary nodule: Secondary | ICD-10-CM

## 2022-06-19 MED ORDER — TRAMADOL HCL 50 MG PO TABS: 50.0000 mg | ORAL_TABLET | Freq: Four times a day (QID) | ORAL | 0 refills | Status: DC | PRN

## 2022-06-22 ENCOUNTER — Telehealth: Payer: Self-pay | Admitting: Internal Medicine

## 2022-06-22 NOTE — Telephone Encounter (Signed)
Scheduled appointment per 11/20 referral. Patient is aware of appointment date and time. Patient is aware to arrive 15 mins prior to appointment time and to bring updated insurance cards. Patient is aware of location.

## 2022-06-23 LAB — CYTOLOGY - NON PAP

## 2022-07-13 ENCOUNTER — Other Ambulatory Visit: Payer: Self-pay | Admitting: Medical Oncology

## 2022-07-13 DIAGNOSIS — R911 Solitary pulmonary nodule: Secondary | ICD-10-CM

## 2022-07-14 ENCOUNTER — Inpatient Hospital Stay: Payer: 59 | Attending: Internal Medicine | Admitting: Internal Medicine

## 2022-07-14 ENCOUNTER — Inpatient Hospital Stay: Payer: 59

## 2022-07-14 VITALS — BP 142/93 | HR 80 | Temp 97.7°F | Resp 16 | Wt 189.9 lb

## 2022-07-14 DIAGNOSIS — K8591 Acute pancreatitis with uninfected necrosis, unspecified: Secondary | ICD-10-CM

## 2022-07-14 DIAGNOSIS — C3490 Malignant neoplasm of unspecified part of unspecified bronchus or lung: Secondary | ICD-10-CM

## 2022-07-14 DIAGNOSIS — R911 Solitary pulmonary nodule: Secondary | ICD-10-CM

## 2022-07-14 DIAGNOSIS — C3431 Malignant neoplasm of lower lobe, right bronchus or lung: Secondary | ICD-10-CM

## 2022-07-14 LAB — CBC WITH DIFFERENTIAL (CANCER CENTER ONLY)
Abs Immature Granulocytes: 0.01 10*3/uL (ref 0.00–0.07)
Basophils Absolute: 0 10*3/uL (ref 0.0–0.1)
Basophils Relative: 1 %
Eosinophils Absolute: 0.2 10*3/uL (ref 0.0–0.5)
Eosinophils Relative: 5 %
HCT: 40.6 % (ref 36.0–46.0)
Hemoglobin: 13.4 g/dL (ref 12.0–15.0)
Immature Granulocytes: 0 %
Lymphocytes Relative: 16 %
Lymphs Abs: 0.7 10*3/uL (ref 0.7–4.0)
MCH: 27.4 pg (ref 26.0–34.0)
MCHC: 33 g/dL (ref 30.0–36.0)
MCV: 83 fL (ref 80.0–100.0)
Monocytes Absolute: 0.2 10*3/uL (ref 0.1–1.0)
Monocytes Relative: 6 %
Neutro Abs: 3.1 10*3/uL (ref 1.7–7.7)
Neutrophils Relative %: 72 %
Platelet Count: 140 10*3/uL — ABNORMAL LOW (ref 150–400)
RBC: 4.89 MIL/uL (ref 3.87–5.11)
RDW: 14.3 % (ref 11.5–15.5)
WBC Count: 4.3 10*3/uL (ref 4.0–10.5)
nRBC: 0 % (ref 0.0–0.2)

## 2022-07-14 LAB — CMP (CANCER CENTER ONLY)
ALT: 32 U/L (ref 0–44)
AST: 29 U/L (ref 15–41)
Albumin: 4.3 g/dL (ref 3.5–5.0)
Alkaline Phosphatase: 173 U/L — ABNORMAL HIGH (ref 38–126)
Anion gap: 6 (ref 5–15)
BUN: 16 mg/dL (ref 6–20)
CO2: 29 mmol/L (ref 22–32)
Calcium: 9.8 mg/dL (ref 8.9–10.3)
Chloride: 105 mmol/L (ref 98–111)
Creatinine: 0.85 mg/dL (ref 0.44–1.00)
GFR, Estimated: >60 mL/min (ref >60)
Glucose, Bld: 114 mg/dL — ABNORMAL HIGH (ref 70–99)
Potassium: 4.5 mmol/L (ref 3.5–5.1)
Sodium: 140 mmol/L (ref 135–145)
Total Bilirubin: 1 mg/dL (ref 0.3–1.2)
Total Protein: 7.1 g/dL (ref 6.5–8.1)

## 2022-07-14 NOTE — Progress Notes (Signed)
[facility] [phone]   [phone]  CONSULT NOTE  REFERRING PHYSICIAN: Dr. Melodie Bouillon  REASON FOR CONSULTATION:  51 years old white female recently diagnosed with lung cancer.  HPI Kimberly Kelly is a 51 y.o. female never smoker with past medical history significant for anxiety, osteoarthritis as well as necrotizing pancreatitis.  The patient mentioned that during one of her episodes of the necrotizing pancreatitis she had imaging studies including the lower part of the chest which showed groundglass airspace opacity in the right lower lobe.  This was followed by CT scan of the chest without contrast on 11/22/2021 and that showed a groundglass airspace opacity in the right lower lobe measuring 1.3 cm suspicious to be postinfectious or adenocarcinoma.  She was followed by observation and repeat CT super D of the chest on 03/31/2022 showed the previously noted groundglass attenuation nodule in the right lower lobe is essentially stable in size and appearance and measuring 1.4 x 1.0 x 1.4 cm.  Repeat CT scan of the chest in 1 year was recommended.  This was performed on 04/20/2022 and that showed a stable appearance of the groundglass 1.4 cm superior segment right lower lobe pulmonary nodule.  The patient had a PET scan on 05/13/2022 and that showed the superior segment right lower lobe subsolid/groundglass nodule was stable in size and measuring approximately 1.2 cm with no hypermetabolism identified with SUV max of 1.05.  On June 11, 2022 the patient underwent video bronchoscopy with robotic assisted bronchoscopic navigation by Dr. Verlee Monte followed by robotic assisted right video thoracoscopy with right lower lobectomy and mediastinal lymph node sampling under the care of Dr. Kipp Brood. The final pathology [id number] ) showed invasive moderately differentiated adenocarcinoma with the tumor measuring 1.1 x 0.8 x 0.7 cm with free resection margin and no  evidence for visceral pleural or lymphovascular invasion. The patient is recovering well from her surgery except for the numbness and intermittent soreness on the right side of the chest.  She was referred to me today for evaluation and recommendation regarding her condition. When seen today she continues to have the soreness on the right side of the chest as well as shortness of breath with exertion and mild cough but no hemoptysis.  She denied having any current nausea, vomiting, diarrhea or constipation.  She has no headache or visual changes.  She has no recent weight loss or night sweats. Family history significant for mother with hypertension and rheumatoid arthritis.  Father had diabetes mellitus.  Paternal grandmother had breast cancer. The patient is married and has 2 children a daughter who is age 75 and son age 36.  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  She was accompanied today by her husband Kimberly Kelly.  She has no history of smoking and drinks alcohol occasionally and no history of drug abuse.  HPI  Past Medical History:  Diagnosis Date   Anxiety    Arthritis    Complication of anesthesia    Certain medication that gives her general myalgia   Necrotizing pancreatitis 2022    Past Surgical History:  Procedure Laterality Date   BIOPSY  02/07/2021   Procedure: BIOPSY;  Surgeon: Rush Landmark Telford Nab., MD;  Location: [facility];  Service: Gastroenterology;;   BRONCHIAL BIOPSY  06/11/2022   Procedure: BRONCHIAL BIOPSIES;  Surgeon: Maryjane Hurter, MD;  Location: [facility] ENDOSCOPY;  Service: Pulmonary;;   BRONCHIAL BRUSHINGS  06/11/2022   Procedure: BRONCHIAL BRUSHINGS;  Surgeon: Maryjane Hurter, MD;  Location: [facility];  Service: Pulmonary;;   BRONCHIAL NEEDLE ASPIRATION BIOPSY  06/11/2022   Procedure: BRONCHIAL NEEDLE ASPIRATION BIOPSIES;  Surgeon: Maryjane Hurter, MD;  Location: [facility];  Service: Pulmonary;;   CESAREAN SECTION      CHOLECYSTECTOMY N/A 10/10/2016   Procedure: LAPAROSCOPIC CHOLECYSTECTOMY WITH INTRAOPERATIVE CHOLANGIOGRAM;  Surgeon: Erroll Luna, MD;  Location: [facility];  Service: General;  Laterality: N/A;   ERCP     ESOPHAGOGASTRODUODENOSCOPY (EGD) WITH PROPOFOL N/A 02/01/2021   Procedure: ESOPHAGOGASTRODUODENOSCOPY (EGD) WITH PROPOFOL;  Surgeon: Clarene Essex, MD;  Location: [facility];  Service: Endoscopy;  Laterality: N/A;   ESOPHAGOGASTRODUODENOSCOPY (EGD) WITH PROPOFOL N/A 02/07/2021   Procedure: ESOPHAGOGASTRODUODENOSCOPY (EGD) WITH PROPOFOL;  Surgeon: Rush Landmark Telford Nab., MD;  Location: [facility];  Service: Gastroenterology;  Laterality: N/A;   FIDUCIAL MARKER PLACEMENT  06/11/2022   Procedure: FIDUCIAL DYE MARKING;  Surgeon: Maryjane Hurter, MD;  Location: [facility] ENDOSCOPY;  Service: Pulmonary;;   INTERCOSTAL NERVE BLOCK Right 06/11/2022   Procedure: INTERCOSTAL NERVE BLOCK;  Surgeon: Lajuana Matte, MD;  Location: [facility];  Service: Thoracic;  Laterality: Right;   SHOULDER ARTHROSCOPY     TUBAL LIGATION     UPPER ESOPHAGEAL ENDOSCOPIC ULTRASOUND (EUS) Left 02/07/2021   Procedure: UPPER ESOPHAGEAL ENDOSCOPIC ULTRASOUND (EUS);  Surgeon: Irving Copas., MD;  Location: [facility];  Service: Gastroenterology;  Laterality: Left;    Family History  Problem Relation Age of Onset   Breast cancer Paternal Grandmother     Social History Social History   Tobacco Use   Smoking status: Never   Smokeless tobacco: Never  Vaping Use   Vaping Use: Never used  Substance Use Topics   Alcohol use: Yes    Comment: occasionally   Drug use: No    No Known Allergies  Current Outpatient Medications  Medication Sig Dispense Refill   ALPRAZolam (XANAX) 0.5 MG tablet Take 0.5 mg by mouth at bedtime as needed for anxiety or sleep.     escitalopram (LEXAPRO) 10 MG tablet Take 10 mg by mouth daily.     methocarbamol (ROBAXIN) 500 MG tablet Take 500 mg by mouth every 6 (six) hours as needed  for muscle spasms.     traMADol (ULTRAM) 50 MG tablet Take 1 tablet (50 mg total) by mouth every 6 (six) hours as needed. 40 tablet 0   No current facility-administered medications for this visit.    Review of Systems  Constitutional: negative Eyes: negative Ears, nose, mouth, throat, and face: negative Respiratory: positive for cough and dyspnea on exertion Cardiovascular: negative Gastrointestinal: negative Genitourinary:negative Integument/breast: negative Hematologic/lymphatic: negative Musculoskeletal:negative Neurological: negative Behavioral/Psych: negative Endocrine: negative Allergic/Immunologic: negative  Physical Exam  FUX:NATFT, healthy, no distress, well nourished, well developed, and anxious SKIN: skin color, texture, turgor are normal, no rashes or significant lesions HEAD: Normocephalic, No masses, lesions, tenderness or abnormalities EYES: normal, PERRLA, Conjunctiva are pink and non-injected EARS: External ears normal, Canals clear OROPHARYNX:no exudate, no erythema, and lips, buccal mucosa, and tongue normal  NECK: supple, no adenopathy, no JVD LYMPH:  no palpable lymphadenopathy, no hepatosplenomegaly BREAST:not examined LUNGS: clear to auscultation , and palpation HEART: regular rate & rhythm, no murmurs, and no gallops ABDOMEN:abdomen soft, non-tender, normal bowel sounds, and no masses or organomegaly BACK: Back symmetric, no curvature., No CVA tenderness EXTREMITIES:no joint deformities, effusion, or inflammation, no edema  NEURO: alert & oriented x 3 with fluent speech, no focal motor/sensory deficits  PERFORMANCE STATUS: ECOG 1  LABORATORY DATA: Lab Results  Component Value Date   WBC 4.3 07/14/2022   HGB 13.4 07/14/2022   HCT 40.6 07/14/2022   MCV 83.0 07/14/2022   PLT 140 (L) 07/14/2022      Chemistry      Component Value Date/Time   NA 139 06/13/2022 0013   K 4.0 06/13/2022 0013   CL 108 06/13/2022 0013   CO2 24 06/13/2022 0013    BUN 23 (H) 06/13/2022 0013   CREATININE 1.17 (H) 06/13/2022 0013      Component Value Date/Time   CALCIUM 8.6 (L) 06/13/2022 0013   ALKPHOS 150 (H) 06/13/2022 0013   AST 47 (H) 06/13/2022 0013   ALT 55 (H) 06/13/2022 0013   BILITOT 0.7 06/13/2022 0013       RADIOGRAPHIC STUDIES: No results found.  ASSESSMENT: This is a very pleasant never smoker 51 years old white female recently diagnosed with a stage Ia (T1b, N0, M0) non-small cell lung cancer, adenocarcinoma presented with superior segment right lower lobe lung nodule status post right lower lobectomy with lymph node sampling under the care of Dr. Kipp Brood on June 11, 2022.   PLAN: I had a lengthy discussion with the patient and her husband today about her current disease stage, prognosis and treatment options. I personally and independently reviewed the scan images as well as the pathology report and discussed them with the patient. I explained to the patient that she already received the curative treatment for her condition with the surgical resection. I also explained to the patient that there is no survival benefit for any adjuvant treatment with chemotherapy, radiation or immunotherapy for patient with a stage Ia non-small cell lung cancer. I also do not see a need to do the molecular studies on her tumor at this point but this would be a consideration in the future if she develop any disease recurrence. I recommended for the patient to continue on observation with repeat CT scan of the chest in 6 months. She was advised to call immediately if she has any other concerning symptoms in the interval.  The patient voices understanding of current disease status and treatment options and is in agreement with the current care plan.  All questions were answered. The patient knows to call the clinic with any problems, questions or concerns. We can certainly see the patient much sooner if necessary.  Thank you so much for allowing  me to participate in the care of Kimberly Kelly. I will continue to follow up the patient with you and assist in her care.  The total time spent in the appointment was 60 minutes.  Disclaimer: This note was dictated with voice recognition software. Similar sounding words can inadvertently be transcribed and may not be corrected upon review.   Kimberly Kelly July 14, 2022, 11:42 AM

## 2022-07-31 ENCOUNTER — Ambulatory Visit
Admission: RE | Admit: 2022-07-31 | Discharge: 2022-07-31 | Disposition: A | Discharge status: Home / Self Care | Payer: 59 | Source: Ambulatory Visit | Attending: Thoracic Surgery (Cardiothoracic Vascular Surgery) | Admitting: Thoracic Surgery (Cardiothoracic Vascular Surgery)

## 2022-07-31 ENCOUNTER — Other Ambulatory Visit: Payer: Self-pay | Admitting: Thoracic Surgery (Cardiothoracic Vascular Surgery)

## 2022-07-31 ENCOUNTER — Ambulatory Visit (INDEPENDENT_AMBULATORY_CARE_PROVIDER_SITE_OTHER): Payer: 59 | Admitting: Thoracic Surgery (Cardiothoracic Vascular Surgery)

## 2022-07-31 ENCOUNTER — Encounter: Payer: Self-pay | Admitting: Thoracic Surgery (Cardiothoracic Vascular Surgery)

## 2022-07-31 ENCOUNTER — Ambulatory Visit: Payer: 59 | Admitting: Thoracic Surgery (Cardiothoracic Vascular Surgery)

## 2022-07-31 VITALS — BP 136/86 | HR 72 | Resp 20 | Ht 67.0 in | Wt 192.0 lb

## 2022-07-31 DIAGNOSIS — R911 Solitary pulmonary nodule: Secondary | ICD-10-CM

## 2022-08-04 NOTE — Progress Notes (Signed)
      [address]       [place] [address]             [phone]        Kimberly Kelly Kimberly Kelly Medical Record #[id number] Date of Birth: 12-05-1970  Referring: Maryjane Hurter, MD Primary Care: Kimberly Kelly, [place] Primary Cardiologist:None  Reason for visit:   follow-up  History of Present Illness:     [name] comes in for her 1 month appointment.  Overall she is doing well.  She denies any pain or shortness of breath.  Physical Exam: BP 136/86 (BP Location: Left Arm, Patient Position: Sitting, Cuff Size: Normal)   Pulse 72   Resp 20   Ht 5\' 7"  (1.702 m)   Wt 192 lb (87.1 kg)   LMP 02/14/2020   SpO2 98% Comment: RA  BMI 30.07 kg/m   Alert NAD Incision clean. Abdomen, ND No peripheral edema   Diagnostic Studies & Laboratory data: CXR: 1. Small residual right hydropneumothorax. 2. Right middle lobe collapse/consolidation with additional airspace opacification in the right lung base on the lateral view. Findings are worrisome for pneumonia.      Assessment / Plan:   52 year old female status post a right lower lobectomy for T1b N0 M0 stage I adenocarcinoma. Overall she is doing well.  She will follow-up as needed.   Kimberly Kelly 08/04/2022 10:15 AM

## 2022-12-03 ENCOUNTER — Encounter: Payer: Self-pay | Admitting: Internal Medicine

## 2022-12-04 ENCOUNTER — Encounter: Payer: Self-pay | Admitting: Medical Oncology

## 2022-12-04 ENCOUNTER — Telehealth: Payer: Self-pay | Admitting: Internal Medicine

## 2023-01-06 ENCOUNTER — Ambulatory Visit (HOSPITAL_COMMUNITY)
Admission: RE | Admit: 2023-01-06 | Discharge: 2023-01-06 | Disposition: A | Discharge status: Home / Self Care | Payer: 59 | Source: Ambulatory Visit | Attending: Internal Medicine | Admitting: Internal Medicine

## 2023-01-06 ENCOUNTER — Inpatient Hospital Stay: Payer: 59 | Attending: Internal Medicine

## 2023-01-06 DIAGNOSIS — R0602 Shortness of breath: Secondary | ICD-10-CM | POA: Insufficient documentation

## 2023-01-06 DIAGNOSIS — R161 Splenomegaly, not elsewhere classified: Secondary | ICD-10-CM | POA: Insufficient documentation

## 2023-01-06 DIAGNOSIS — C3431 Malignant neoplasm of lower lobe, right bronchus or lung: Secondary | ICD-10-CM | POA: Insufficient documentation

## 2023-01-06 DIAGNOSIS — C3490 Malignant neoplasm of unspecified part of unspecified bronchus or lung: Secondary | ICD-10-CM | POA: Diagnosis present

## 2023-01-06 LAB — CMP (CANCER CENTER ONLY)
ALT: 39 U/L (ref 0–44)
AST: 33 U/L (ref 15–41)
Albumin: 4.3 g/dL (ref 3.5–5.0)
Alkaline Phosphatase: 163 U/L — ABNORMAL HIGH (ref 38–126)
Anion gap: 8 (ref 5–15)
BUN: 19 mg/dL (ref 6–20)
CO2: 27 mmol/L (ref 22–32)
Calcium: 9.5 mg/dL (ref 8.9–10.3)
Chloride: 106 mmol/L (ref 98–111)
Creatinine: 0.85 mg/dL (ref 0.44–1.00)
GFR, Estimated: >60 mL/min (ref >60)
Glucose, Bld: 123 mg/dL — ABNORMAL HIGH (ref 70–99)
Potassium: 4.2 mmol/L (ref 3.5–5.1)
Sodium: 141 mmol/L (ref 135–145)
Total Bilirubin: 0.9 mg/dL (ref 0.3–1.2)
Total Protein: 7.3 g/dL (ref 6.5–8.1)

## 2023-01-06 LAB — CBC WITH DIFFERENTIAL (CANCER CENTER ONLY)
Abs Immature Granulocytes: 0.02 10*3/uL (ref 0.00–0.07)
Basophils Absolute: 0 10*3/uL (ref 0.0–0.1)
Basophils Relative: 1 %
Eosinophils Absolute: 0.1 10*3/uL (ref 0.0–0.5)
Eosinophils Relative: 3 %
HCT: 41.5 % (ref 36.0–46.0)
Hemoglobin: 13.9 g/dL (ref 12.0–15.0)
Immature Granulocytes: 1 %
Lymphocytes Relative: 17 %
Lymphs Abs: 0.7 10*3/uL (ref 0.7–4.0)
MCH: 27.9 pg (ref 26.0–34.0)
MCHC: 33.5 g/dL (ref 30.0–36.0)
MCV: 83.3 fL (ref 80.0–100.0)
Monocytes Absolute: 0.2 10*3/uL (ref 0.1–1.0)
Monocytes Relative: 4 %
Neutro Abs: 2.9 10*3/uL (ref 1.7–7.7)
Neutrophils Relative %: 74 %
Platelet Count: 136 10*3/uL — ABNORMAL LOW (ref 150–400)
RBC: 4.98 MIL/uL (ref 3.87–5.11)
RDW: 14.6 % (ref 11.5–15.5)
WBC Count: 3.9 10*3/uL — ABNORMAL LOW (ref 4.0–10.5)
nRBC: 0 % (ref 0.0–0.2)

## 2023-01-06 MED ORDER — IOHEXOL 300 MG/ML  SOLN
75.0000 mL | Freq: Once | INTRAMUSCULAR | Status: AC | PRN
  Administered 2023-01-06: 75 mL via INTRAVENOUS

## 2023-01-08 ENCOUNTER — Other Ambulatory Visit: Payer: 59

## 2023-01-11 ENCOUNTER — Encounter: Payer: Self-pay | Admitting: Internal Medicine

## 2023-01-12 ENCOUNTER — Other Ambulatory Visit: Payer: Self-pay

## 2023-01-12 ENCOUNTER — Inpatient Hospital Stay: Payer: 59 | Admitting: Internal Medicine

## 2023-01-12 VITALS — BP 139/90 | HR 88 | Temp 97.4°F | Resp 17 | Wt 204.5 lb

## 2023-01-12 DIAGNOSIS — C3431 Malignant neoplasm of lower lobe, right bronchus or lung: Secondary | ICD-10-CM | POA: Diagnosis present

## 2023-01-12 DIAGNOSIS — R161 Splenomegaly, not elsewhere classified: Secondary | ICD-10-CM | POA: Diagnosis not present

## 2023-01-12 DIAGNOSIS — C3490 Malignant neoplasm of unspecified part of unspecified bronchus or lung: Secondary | ICD-10-CM

## 2023-01-12 DIAGNOSIS — R0602 Shortness of breath: Secondary | ICD-10-CM | POA: Diagnosis not present

## 2023-01-12 NOTE — Progress Notes (Signed)
[facility] [phone]   [phone]  OFFICE PROGRESS NOTE  Elizabeth Palau, FNP [address] [place] [address]  DIAGNOSIS: Stage IA (T1b, N0, M0) non-small cell lung cancer, adenocarcinoma presented with superior segment right lower lobe lung nodule   PRIOR THERAPY: status post right lower lobectomy with lymph node sampling under the care of Dr. Cliffton Asters on June 11, 2022.   CURRENT THERAPY: Observation.   INTERVAL HISTORY: Kimberly Kelly 52 y.o. female returns to the clinic today for 6 months follow-up visit accompanied by her husband, Jesusita Oka.  The patient is feeling fine today with no concerning complaints except for occasional shortness of breath with exertion.  She denied having any chest pain, cough or hemoptysis.  She has no nausea, vomiting, diarrhea or constipation.  She has no headache or visual changes.  She denied having any recent weight loss or night sweats.  She is here today for evaluation with repeat CT scan of the chest for restaging of her disease.  MEDICAL HISTORY: Past Medical History:  Diagnosis Date   Anxiety    Arthritis    Complication of anesthesia    Certain medication that gives her general myalgia   Necrotizing pancreatitis 2022    ALLERGIES:  has No Known Allergies.  MEDICATIONS:  Current Outpatient Medications  Medication Sig Dispense Refill   ALPRAZolam (XANAX) 0.5 MG tablet Take 0.5 mg by mouth at bedtime as needed for anxiety or sleep.     escitalopram (LEXAPRO) 10 MG tablet Take 10 mg by mouth daily.     methocarbamol (ROBAXIN) 500 MG tablet Take 500 mg by mouth every 6 (six) hours as needed for muscle spasms.     No current facility-administered medications for this visit.    SURGICAL HISTORY:  Past Surgical History:  Procedure Laterality Date   BIOPSY  02/07/2021   Procedure: BIOPSY;  Surgeon: Mansouraty, Netty Starring., MD;  Location: [facility] ENDOSCOPY;  Service:  Gastroenterology;;   BRONCHIAL BIOPSY  06/11/2022   Procedure: BRONCHIAL BIOPSIES;  Surgeon: Omar Person, MD;  Location: [facility] ENDOSCOPY;  Service: Pulmonary;;   BRONCHIAL BRUSHINGS  06/11/2022   Procedure: BRONCHIAL BRUSHINGS;  Surgeon: Omar Person, MD;  Location: [facility] ENDOSCOPY;  Service: Pulmonary;;   BRONCHIAL NEEDLE ASPIRATION BIOPSY  06/11/2022   Procedure: BRONCHIAL NEEDLE ASPIRATION BIOPSIES;  Surgeon: Omar Person, MD;  Location: [facility] ENDOSCOPY;  Service: Pulmonary;;   CESAREAN SECTION     CHOLECYSTECTOMY N/A 10/10/2016   Procedure: LAPAROSCOPIC CHOLECYSTECTOMY WITH INTRAOPERATIVE CHOLANGIOGRAM;  Surgeon: Harriette Bouillon, MD;  Location: [facility] OR;  Service: General;  Laterality: N/A;   ERCP     ESOPHAGOGASTRODUODENOSCOPY (EGD) WITH PROPOFOL N/A 02/01/2021   Procedure: ESOPHAGOGASTRODUODENOSCOPY (EGD) WITH PROPOFOL;  Surgeon: Vida Rigger, MD;  Location: [facility] ENDOSCOPY;  Service: Endoscopy;  Laterality: N/A;   ESOPHAGOGASTRODUODENOSCOPY (EGD) WITH PROPOFOL N/A 02/07/2021   Procedure: ESOPHAGOGASTRODUODENOSCOPY (EGD) WITH PROPOFOL;  Surgeon: Meridee Score Netty Starring., MD;  Location: [facility] ENDOSCOPY;  Service: Gastroenterology;  Laterality: N/A;   FIDUCIAL MARKER PLACEMENT  06/11/2022   Procedure: FIDUCIAL DYE MARKING;  Surgeon: Omar Person, MD;  Location: [facility] ENDOSCOPY;  Service: Pulmonary;;   INTERCOSTAL NERVE BLOCK Right 06/11/2022   Procedure: INTERCOSTAL NERVE BLOCK;  Surgeon: Corliss Skains, MD;  Location: [facility] OR;  Service: Thoracic;  Laterality: Right;   SHOULDER ARTHROSCOPY     TUBAL LIGATION     UPPER ESOPHAGEAL ENDOSCOPIC ULTRASOUND (EUS) Left 02/07/2021   Procedure: UPPER ESOPHAGEAL  ENDOSCOPIC ULTRASOUND (EUS);  Surgeon: Lemar Lofty., MD;  Location: [facility] ENDOSCOPY;  Service: Gastroenterology;  Laterality: Left;    REVIEW OF SYSTEMS:  A comprehensive review of systems was negative except for: Respiratory: positive for dyspnea on exertion   PHYSICAL  EXAMINATION: General appearance: alert, cooperative, and no distress Head: Normocephalic, without obvious abnormality, atraumatic Neck: no adenopathy, no JVD, supple, symmetrical, trachea midline, and thyroid not enlarged, symmetric, no tenderness/mass/nodules Lymph nodes: Cervical, supraclavicular, and axillary nodes normal. Resp: clear to auscultation bilaterally Back: symmetric, no curvature. ROM normal. No CVA tenderness. Cardio: regular rate and rhythm, S1, S2 normal, no murmur, click, rub or gallop GI: soft, non-tender; bowel sounds normal; no masses,  no organomegaly Extremities: extremities normal, atraumatic, no cyanosis or edema  ECOG PERFORMANCE STATUS: 1 - Symptomatic but completely ambulatory  Blood pressure (!) 139/90, pulse 88, temperature (!) 97.4 F (36.3 C), temperature source Oral, resp. rate 17, weight 204 lb 8 oz (92.8 kg), last menstrual period 02/14/2020, SpO2 96 %.  LABORATORY DATA: Lab Results  Component Value Date   WBC 3.9 (L) 01/06/2023   HGB 13.9 01/06/2023   HCT 41.5 01/06/2023   MCV 83.3 01/06/2023   PLT 136 (L) 01/06/2023      Chemistry      Component Value Date/Time   NA 141 01/06/2023 0902   K 4.2 01/06/2023 0902   CL 106 01/06/2023 0902   CO2 27 01/06/2023 0902   BUN 19 01/06/2023 0902   CREATININE 0.85 01/06/2023 0902      Component Value Date/Time   CALCIUM 9.5 01/06/2023 0902   ALKPHOS 163 (H) 01/06/2023 0902   AST 33 01/06/2023 0902   ALT 39 01/06/2023 0902   BILITOT 0.9 01/06/2023 0902       RADIOGRAPHIC STUDIES: CT Chest W Contrast  Result Date: 01/12/2023 CLINICAL DATA:  Non-small-cell lung cancer, staging. Status post right lower lobectomy. * Tracking Code: BO * EXAM: CT CHEST WITH CONTRAST TECHNIQUE: Multidetector CT imaging of the chest was performed during intravenous contrast administration. RADIATION DOSE REDUCTION: This exam was performed according to the departmental dose-optimization program which includes automated  exposure control, adjustment of the mA and/or kV according to patient size and/or use of iterative reconstruction technique. CONTRAST:  75mL OMNIPAQUE IOHEXOL 300 MG/ML  SOLN COMPARISON:  05/13/2022 PET. 04/20/2022 CT. Clinic note of 07/31/2022. FINDINGS: Cardiovascular: Aortic atherosclerosis. Borderline cardiomegaly, without pericardial effusion. No central pulmonary embolism, on this non-dedicated study. Mediastinum/Nodes: No supraclavicular adenopathy. No mediastinal or hilar adenopathy. Lungs/Pleura: Trace loculated inferior right pleural fluid on 216/10. No pneumothorax. Right lower lobectomy. Macrolobulated anterior left lower lobe pulmonary nodule of 1.2 cm has been stable over multiple prior exams back and is considered benign. Upper Abdomen: Marked caudate lobe enlargement. Incompletely imaged marked splenomegaly. Drainage catheter in the stomach related to prior pseudocyst drainage. Enlarged gastroepiploic collaterals. Musculoskeletal: No acute osseous abnormality. IMPRESSION: 1. Status post right lower lobectomy, without recurrent or metastatic disease. 2. Trace, loculated right-sided pleural fluid. 3. Marked splenomegaly and probable cirrhosis, incompletely imaged. Gastroepiploic collaterals may be related to splenic vein thrombosis and/or portal venous hypertension. 4.  Aortic Atherosclerosis (ICD10-I70.0). Electronically Signed   By: Jeronimo Greaves M.D.   On: 01/12/2023 08:38    ASSESSMENT AND PLAN: This is a very pleasant 52 years old white female with stage Ia (T1b, N0, M0) non-small cell lung cancer, adenocarcinoma presented with superior segment right lower lobe lung nodule status post right lower lobectomy with lymph node sampling under the care of  Dr. Cliffton Asters on June 11, 2022.  The patient is currently on observation and she is feeling fine with no concerning complaints. She had repeat CT scan of the chest performed recently.  I personally and independently reviewed the scan and discussed  the result with the patient today. Her scan showed no evidence for disease recurrence or metastasis. I recommended for her to continue on observation with repeat CT scan of the chest in 6 months. Her scan showed marked splenomegaly with probable cirrhosis.  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  She will reach out to her gastroenterologist for additional evaluation. She was advised to call immediately if she has any other concerning symptoms in the interval. The patient voices understanding of current disease status and treatment options and is in agreement with the current care plan.  All questions were answered. The patient knows to call the clinic with any problems, questions or concerns. We can certainly see the patient much sooner if necessary.  The total time spent in the appointment was 20 minutes.  Disclaimer: This note was dictated with voice recognition software. Similar sounding words can inadvertently be transcribed and may not be corrected upon review.

## 2023-07-07 ENCOUNTER — Inpatient Hospital Stay: Payer: 59 | Attending: Internal Medicine

## 2023-07-07 ENCOUNTER — Encounter (HOSPITAL_COMMUNITY): Payer: Self-pay

## 2023-07-07 ENCOUNTER — Ambulatory Visit (HOSPITAL_COMMUNITY)
Admission: RE | Admit: 2023-07-07 | Discharge: 2023-07-07 | Disposition: A | Discharge status: Home / Self Care | Payer: 59 | Source: Ambulatory Visit | Attending: Internal Medicine | Admitting: Internal Medicine

## 2023-07-07 DIAGNOSIS — C3490 Malignant neoplasm of unspecified part of unspecified bronchus or lung: Secondary | ICD-10-CM | POA: Diagnosis present

## 2023-07-07 LAB — CMP (CANCER CENTER ONLY)
ALT: 31 U/L (ref 0–44)
AST: 25 U/L (ref 15–41)
Albumin: 4.2 g/dL (ref 3.5–5.0)
Alkaline Phosphatase: 113 U/L (ref 38–126)
Anion gap: 6 (ref 5–15)
BUN: 17 mg/dL (ref 6–20)
CO2: 25 mmol/L (ref 22–32)
Calcium: 9.3 mg/dL (ref 8.9–10.3)
Chloride: 111 mmol/L (ref 98–111)
Creatinine: 0.81 mg/dL (ref 0.44–1.00)
GFR, Estimated: >60 mL/min (ref >60)
Glucose, Bld: 119 mg/dL — ABNORMAL HIGH (ref 70–99)
Potassium: 4.3 mmol/L (ref 3.5–5.1)
Sodium: 142 mmol/L (ref 135–145)
Total Bilirubin: 0.7 mg/dL (ref <1.2)
Total Protein: 6.7 g/dL (ref 6.5–8.1)

## 2023-07-07 LAB — CBC WITH DIFFERENTIAL (CANCER CENTER ONLY)
Abs Immature Granulocytes: 0.03 10*3/uL (ref 0.00–0.07)
Basophils Absolute: 0 10*3/uL (ref 0.0–0.1)
Basophils Relative: 1 %
Eosinophils Absolute: 0.1 10*3/uL (ref 0.0–0.5)
Eosinophils Relative: 4 %
HCT: 41 % (ref 36.0–46.0)
Hemoglobin: 13.7 g/dL (ref 12.0–15.0)
Immature Granulocytes: 1 %
Lymphocytes Relative: 20 %
Lymphs Abs: 0.5 10*3/uL — ABNORMAL LOW (ref 0.7–4.0)
MCH: 27.9 pg (ref 26.0–34.0)
MCHC: 33.4 g/dL (ref 30.0–36.0)
MCV: 83.5 fL (ref 80.0–100.0)
Monocytes Absolute: 0.1 10*3/uL (ref 0.1–1.0)
Monocytes Relative: 4 %
Neutro Abs: 1.9 10*3/uL (ref 1.7–7.7)
Neutrophils Relative %: 70 %
Platelet Count: 117 10*3/uL — ABNORMAL LOW (ref 150–400)
RBC: 4.91 MIL/uL (ref 3.87–5.11)
RDW: 13.6 % (ref 11.5–15.5)
WBC Count: 2.7 10*3/uL — ABNORMAL LOW (ref 4.0–10.5)
nRBC: 0 % (ref 0.0–0.2)

## 2023-07-07 MED ORDER — IOHEXOL 300 MG/ML  SOLN
75.0000 mL | Freq: Once | INTRAMUSCULAR | Status: AC | PRN
  Administered 2023-07-07: 75 mL via INTRAVENOUS

## 2023-07-08 ENCOUNTER — Other Ambulatory Visit: Payer: 59

## 2023-07-12 ENCOUNTER — Encounter: Payer: Self-pay | Admitting: Internal Medicine

## 2023-07-13 ENCOUNTER — Inpatient Hospital Stay (HOSPITAL_BASED_OUTPATIENT_CLINIC_OR_DEPARTMENT_OTHER): Payer: 59 | Admitting: Internal Medicine

## 2023-07-13 VITALS — BP 136/92 | HR 93 | Temp 98.7°F | Resp 17 | Wt 206.0 lb

## 2023-07-13 DIAGNOSIS — Z85118 Personal history of other malignant neoplasm of bronchus and lung: Secondary | ICD-10-CM | POA: Diagnosis present

## 2023-07-13 DIAGNOSIS — R161 Splenomegaly, not elsewhere classified: Secondary | ICD-10-CM | POA: Insufficient documentation

## 2023-07-13 DIAGNOSIS — D696 Thrombocytopenia, unspecified: Secondary | ICD-10-CM | POA: Insufficient documentation

## 2023-07-13 DIAGNOSIS — K7460 Unspecified cirrhosis of liver: Secondary | ICD-10-CM | POA: Insufficient documentation

## 2023-07-13 DIAGNOSIS — C3490 Malignant neoplasm of unspecified part of unspecified bronchus or lung: Secondary | ICD-10-CM | POA: Diagnosis not present

## 2023-07-13 NOTE — Progress Notes (Signed)
[facility] Telephone:([phone]   Fax:(336) [phone]  OFFICE PROGRESS NOTE  Elizabeth Palau, FNP [address] [place] [address]  DIAGNOSIS: Stage IA (T1b, N0, M0) non-small cell lung cancer, adenocarcinoma presented with superior segment right lower lobe lung nodule   PRIOR THERAPY: status post right lower lobectomy with lymph node sampling under the care of Dr. Cliffton Asters on June 11, 2022.   CURRENT THERAPY: Observation.   INTERVAL HISTORY: Kimberly Kelly 52 y.o. female returns to the clinic today for 30-month follow-up visit.Discussed the use of AI scribe software for clinical note transcription with the patient, who gave verbal consent to proceed.  History of Present Illness   Kimberly Kelly, a 52 year old with a history of stage 1A non-small cell lung adenocarcinoma, underwent a right lower lobectomy in November 2023. The patient has been under surveillance since the surgery. In the past six months, the patient underwent a liver biopsy due to findings of liver cirrhosis on a CT scan performed in June. The biopsy results were normal, ruling out cirrhosis.  The patient has a history of pancreatitis, which progressed to necrotizing pancreatitis and was complicated by blood clots. This condition resulted in spleenomegaly, which is suspected to be the cause of the patient's low white blood cell count and low platelet count. The patient also has large upper abdominal varices.  The patient's most recent chest CT scan showed no evidence of recurrent or metastatic disease in the chest, indicating stability in her lung cancer status. The scan also confirmed the chronic spleenomegaly.       MEDICAL HISTORY: Past Medical History:  Diagnosis Date   Anxiety    Arthritis    Complication of anesthesia    Certain medication that gives her general myalgia   Necrotizing pancreatitis 2022    ALLERGIES:  has No Known Allergies.  MEDICATIONS:   Current Outpatient Medications  Medication Sig Dispense Refill   ALPRAZolam (XANAX) 0.5 MG tablet Take 0.5 mg by mouth at bedtime as needed for anxiety or sleep.     escitalopram (LEXAPRO) 10 MG tablet Take 10 mg by mouth daily.     methocarbamol (ROBAXIN) 500 MG tablet Take 500 mg by mouth every 6 (six) hours as needed for muscle spasms.     No current facility-administered medications for this visit.    SURGICAL HISTORY:  Past Surgical History:  Procedure Laterality Date   BIOPSY  02/07/2021   Procedure: BIOPSY;  Surgeon: Mansouraty, Netty Starring., MD;  Location: [facility] ENDOSCOPY;  Service: Gastroenterology;;   BRONCHIAL BIOPSY  06/11/2022   Procedure: BRONCHIAL BIOPSIES;  Surgeon: Omar Person, MD;  Location: [facility] ENDOSCOPY;  Service: Pulmonary;;   BRONCHIAL BRUSHINGS  06/11/2022   Procedure: BRONCHIAL BRUSHINGS;  Surgeon: Omar Person, MD;  Location: [facility] ENDOSCOPY;  Service: Pulmonary;;   BRONCHIAL NEEDLE ASPIRATION BIOPSY  06/11/2022   Procedure: BRONCHIAL NEEDLE ASPIRATION BIOPSIES;  Surgeon: Omar Person, MD;  Location: [facility] ENDOSCOPY;  Service: Pulmonary;;   CESAREAN SECTION     CHOLECYSTECTOMY N/A 10/10/2016   Procedure: LAPAROSCOPIC CHOLECYSTECTOMY WITH INTRAOPERATIVE CHOLANGIOGRAM;  Surgeon: Harriette Bouillon, MD;  Location: [facility] OR;  Service: General;  Laterality: N/A;   ERCP     ESOPHAGOGASTRODUODENOSCOPY (EGD) WITH PROPOFOL N/A 02/01/2021   Procedure: ESOPHAGOGASTRODUODENOSCOPY (EGD) WITH PROPOFOL;  Surgeon: Vida Rigger, MD;  Location: [facility] ENDOSCOPY;  Service: Endoscopy;  Laterality: N/A;   ESOPHAGOGASTRODUODENOSCOPY (EGD) WITH PROPOFOL N/A 02/07/2021   Procedure: ESOPHAGOGASTRODUODENOSCOPY (EGD) WITH PROPOFOL;  Surgeon: Lemar Lofty., MD;  Location: [facility] ENDOSCOPY;  Service: Gastroenterology;  Laterality: N/A;   FIDUCIAL MARKER PLACEMENT  06/11/2022   Procedure: FIDUCIAL DYE MARKING;  Surgeon: Omar Person, MD;  Location: [facility] ENDOSCOPY;  Service: Pulmonary;;    INTERCOSTAL NERVE BLOCK Right 06/11/2022   Procedure: INTERCOSTAL NERVE BLOCK;  Surgeon: Corliss Skains, MD;  Location: [facility] OR;  Service: Thoracic;  Laterality: Right;   SHOULDER ARTHROSCOPY     TUBAL LIGATION     UPPER ESOPHAGEAL ENDOSCOPIC ULTRASOUND (EUS) Left 02/07/2021   Procedure: UPPER ESOPHAGEAL ENDOSCOPIC ULTRASOUND (EUS);  Surgeon: Lemar Lofty., MD;  Location: [facility] ENDOSCOPY;  Service: Gastroenterology;  Laterality: Left;    REVIEW OF SYSTEMS:  A comprehensive review of systems was negative.   PHYSICAL EXAMINATION: General appearance: alert, cooperative, and no distress Head: Normocephalic, without obvious abnormality, atraumatic Neck: no adenopathy, no JVD, supple, symmetrical, trachea midline, and thyroid not enlarged, symmetric, no tenderness/mass/nodules Lymph nodes: Cervical, supraclavicular, and axillary nodes normal. Resp: clear to auscultation bilaterally Back: symmetric, no curvature. ROM normal. No CVA tenderness. Cardio: regular rate and rhythm, S1, S2 normal, no murmur, click, rub or gallop GI: soft, non-tender; bowel sounds normal; no masses,  no organomegaly Extremities: extremities normal, atraumatic, no cyanosis or edema  ECOG PERFORMANCE STATUS: 1 - Symptomatic but completely ambulatory  Blood pressure (!) 136/92, pulse 93, temperature 98.7 F (37.1 C), temperature source Temporal, resp. rate 17, weight 206 lb (93.4 kg), last menstrual period 02/14/2020, SpO2 99%.  LABORATORY DATA: Lab Results  Component Value Date   WBC 2.7 (L) 07/07/2023   HGB 13.7 07/07/2023   HCT 41.0 07/07/2023   MCV 83.5 07/07/2023   PLT 117 (L) 07/07/2023      Chemistry      Component Value Date/Time   NA 142 07/07/2023 0824   K 4.3 07/07/2023 0824   CL 111 07/07/2023 0824   CO2 25 07/07/2023 0824   BUN 17 07/07/2023 0824   CREATININE 0.81 07/07/2023 0824      Component Value Date/Time   CALCIUM 9.3 07/07/2023 0824   ALKPHOS 113 07/07/2023 0824   AST 25  07/07/2023 0824   ALT 31 07/07/2023 0824   BILITOT 0.7 07/07/2023 0824       RADIOGRAPHIC STUDIES: CT Chest W Contrast  Result Date: 07/12/2023 CLINICAL DATA:  Non-small cell lung cancer (NSCLC), restaging. Right lower lobectomy. * Tracking Code: BO * EXAM: CT CHEST WITH CONTRAST TECHNIQUE: Multidetector CT imaging of the chest was performed during intravenous contrast administration. RADIATION DOSE REDUCTION: This exam was performed according to the departmental dose-optimization program which includes automated exposure control, adjustment of the mA and/or kV according to patient size and/or use of iterative reconstruction technique. CONTRAST:  75mL OMNIPAQUE IOHEXOL 300 MG/ML  SOLN COMPARISON:  01/06/2023 chest CT. FINDINGS: Cardiovascular: Normal heart size. No significant pericardial effusion/thickening. Great vessels are normal in course and caliber. No central pulmonary emboli. Mediastinum/Nodes: No significant thyroid nodules. Stable small lower thoracic esophageal varices. No pathologically enlarged axillary, mediastinal or hilar lymph nodes. Lungs/Pleura: No pneumothorax. No pleural effusion. Status post right lower lobectomy. Solid 1.3 x 1.0 cm anterior left lower lobe nodule (series 5/image 107), previously 1.3 x 1.0 cm, unchanged. No acute consolidative airspace disease or new significant pulmonary nodules. Upper abdomen: Chronic double pigtail catheter between distal stomach and pancreatic head and neck. Extensive peripancreatic, porta hepatis and perigastric varices are unchanged. Stable splenomegaly, partially visualized. Musculoskeletal: No aggressive appearing focal osseous lesions. Minimal thoracic spondylosis. IMPRESSION:  1. Stable chest CT status post right lower lobectomy. No evidence of recurrent or metastatic disease in the chest. 2. 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 Electronically Signed   By: Delbert Phenix M.D.   On: 07/12/2023 17:07    ASSESSMENT AND PLAN:  This is a very pleasant 52 years old white female with stage Ia (T1b, N0, M0) non-small cell lung cancer, adenocarcinoma presented with superior segment right lower lobe lung nodule status post right lower lobectomy with lymph node sampling under the care of Dr. Cliffton Asters on June 11, 2022.  The patient is currently on observation and she is feeling fine.  She had repeat CT scan of the chest performed recently.  I personally and independently reviewed the scan and discussed the result with the patient today.  Her scan showed no concerning findings for disease recurrence or metastasis.    Stage 1A Non-Small Cell Lung Cancer (NSCLC) Adenocarcinoma Diagnosed in November 2023. Underwent right lower lobectomy with lymph node sampling by Dr. Cliffton Asters. Follow-up chest CT shows no evidence of recurrent or metastatic disease. Prognosis is favorable with high survival rates. - Continue surveillance with chest CT every six months for one more year, then annually.  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 - Monitor blood counts regularly.  Liver cirrhosis Detected on lung CT in June. Follow-up abdominal CT and liver biopsy by Dr. Hamilton Capri showed no cirrhosis.  Follow-up - Schedule follow-up visit in six months.   The patient was vies to call immediately if she has any other concerning symptoms in the interval. The patient voices understanding of current disease status and treatment options and is in agreement with the current care plan.  All questions were answered. The patient knows to call the clinic with any problems, questions or concerns. We can certainly see the patient much sooner if necessary.  The total time spent in the appointment was 20 minutes.  Disclaimer: This note was dictated with voice recognition software. Similar sounding words can inadvertently be transcribed and may not be corrected upon  review.

## 2023-10-26 ENCOUNTER — Other Ambulatory Visit: Payer: Self-pay

## 2023-10-26 ENCOUNTER — Encounter (HOSPITAL_BASED_OUTPATIENT_CLINIC_OR_DEPARTMENT_OTHER): Payer: Self-pay | Admitting: *Deleted

## 2023-10-26 ENCOUNTER — Observation Stay (HOSPITAL_BASED_OUTPATIENT_CLINIC_OR_DEPARTMENT_OTHER)
Admission: EM | Admit: 2023-10-26 | Discharge: 2023-10-27 | Disposition: A | Discharge status: Home / Self Care | Attending: Internal Medicine | Admitting: Internal Medicine

## 2023-10-26 DIAGNOSIS — F419 Anxiety disorder, unspecified: Secondary | ICD-10-CM | POA: Diagnosis not present

## 2023-10-26 DIAGNOSIS — K922 Gastrointestinal hemorrhage, unspecified: Secondary | ICD-10-CM | POA: Diagnosis not present

## 2023-10-26 DIAGNOSIS — K921 Melena: Secondary | ICD-10-CM | POA: Diagnosis present

## 2023-10-26 DIAGNOSIS — D696 Thrombocytopenia, unspecified: Secondary | ICD-10-CM | POA: Diagnosis not present

## 2023-10-26 DIAGNOSIS — Z79899 Other long term (current) drug therapy: Secondary | ICD-10-CM | POA: Diagnosis not present

## 2023-10-26 LAB — CBC WITH DIFFERENTIAL/PLATELET
Abs Immature Granulocytes: 0.02 10*3/uL (ref 0.00–0.07)
Basophils Absolute: 0 10*3/uL (ref 0.0–0.1)
Basophils Relative: 0 %
Eosinophils Absolute: 0.1 10*3/uL (ref 0.0–0.5)
Eosinophils Relative: 3 %
HCT: 37.2 % (ref 36.0–46.0)
Hemoglobin: 12.4 g/dL (ref 12.0–15.0)
Immature Granulocytes: 0 %
Lymphocytes Relative: 22 %
Lymphs Abs: 1 10*3/uL (ref 0.7–4.0)
MCH: 27.7 pg (ref 26.0–34.0)
MCHC: 33.3 g/dL (ref 30.0–36.0)
MCV: 83.2 fL (ref 80.0–100.0)
Monocytes Absolute: 0.2 10*3/uL (ref 0.1–1.0)
Monocytes Relative: 5 %
Neutro Abs: 3.2 10*3/uL (ref 1.7–7.7)
Neutrophils Relative %: 70 %
Platelets: 122 10*3/uL — ABNORMAL LOW (ref 150–400)
RBC: 4.47 MIL/uL (ref 3.87–5.11)
RDW: 14.6 % (ref 11.5–15.5)
WBC: 4.7 10*3/uL (ref 4.0–10.5)
nRBC: 0 % (ref 0.0–0.2)

## 2023-10-26 LAB — BASIC METABOLIC PANEL
Anion gap: 7 (ref 5–15)
BUN: 19 mg/dL (ref 6–20)
CO2: 27 mmol/L (ref 22–32)
Calcium: 9.1 mg/dL (ref 8.9–10.3)
Chloride: 106 mmol/L (ref 98–111)
Creatinine, Ser: 0.79 mg/dL (ref 0.44–1.00)
GFR, Estimated: >60 mL/min (ref >60)
Glucose, Bld: 120 mg/dL — ABNORMAL HIGH (ref 70–99)
Potassium: 4.1 mmol/L (ref 3.5–5.1)
Sodium: 140 mmol/L (ref 135–145)

## 2023-10-26 MED ORDER — PANTOPRAZOLE SODIUM 40 MG IV SOLR
40.0000 mg | Freq: Once | INTRAVENOUS | Status: AC
  Administered 2023-10-26: 40 mg via INTRAVENOUS
  Filled 2023-10-26: qty 10

## 2023-10-26 NOTE — ED Provider Notes (Signed)
 [place] EMERGENCY DEPARTMENT AT [facility] Provider Note   CSN: [id number] Arrival date & time: 10/26/23  2006     History  Chief Complaint  Patient presents with   Melena    Kimberly Kelly is a 53 y.o. female.  53 yo F with a chief complaints of dark and tarry stools.  Going on for about 12 to 24 hours.  Has had 2 bowel movements.  Kimberly Kelly denies any abdominal pain denies feeling lightheaded or dizzy.  Kimberly Kelly had talked with her hepatologist and had stat labs performed.  Kimberly Kelly then was told to contact her gastroenterologist.  When Kimberly Kelly did they encouraged her to come to the emergency department.  Kimberly Kelly went to [facility] and had waited about 7 hours and when Kimberly Kelly inquired when Kimberly Kelly would likely be seen they told her maybe tomorrow.  Kimberly Kelly then left and came here to be evaluated.  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  He has been banded and her last endoscopy about a month ago did not show any obvious varices.   Kimberly Kelly has recently been on steroids for a sprained ankle.        Home Medications Prior to Admission medications   Medication Sig Start Date End Date Taking? Authorizing Provider  ALPRAZolam Prudy Feeler) 0.5 MG tablet Take 0.5 mg by mouth at bedtime as needed for anxiety or sleep.    [provider]  escitalopram (LEXAPRO) 10 MG tablet Take 10 mg by mouth daily. 08/13/20   [provider]  methocarbamol (ROBAXIN) 500 MG tablet Take 500 mg by mouth every 6 (six) hours as needed for muscle spasms.    [provider]      Allergies    Patient has no known allergies.    Review of Systems   Review of Systems  Physical Exam Updated Vital Signs BP 133/83   Pulse 89   Temp 97.7 F (36.5 C) (Oral)   Resp 17   LMP 02/14/2020   SpO2 97%  Physical Exam Vitals and nursing note reviewed.  Constitutional:      General: Kimberly Kelly is not in acute distress.    Appearance: Kimberly Kelly is well-developed. Kimberly Kelly is not diaphoretic.   HENT:     Head: Normocephalic and atraumatic.  Eyes:     Pupils: Pupils are equal, round, and reactive to light.  Cardiovascular:     Rate and Rhythm: Normal rate and regular rhythm.     Heart sounds: No murmur heard.    No friction rub. No gallop.  Pulmonary:     Effort: Pulmonary effort is normal.     Breath sounds: No wheezing or rales.  Abdominal:     General: There is no distension.     Palpations: Abdomen is soft.     Tenderness: There is no abdominal tenderness.  Musculoskeletal:        General: No tenderness.     Cervical back: Normal range of motion and neck supple.  Skin:    General: Skin is warm and dry.  Neurological:     Mental Status: Kimberly Kelly is alert and oriented to person, place, and time.  Psychiatric:        Behavior: Behavior normal.     ED Results / Procedures / Treatments   Labs (all labs ordered are listed, but only abnormal results are displayed) Labs Reviewed  CBC WITH DIFFERENTIAL/PLATELET - Abnormal; Notable for the following components:      Result Value  Platelets 122 (*)    All other components within normal limits  BASIC METABOLIC PANEL - Abnormal; Notable for the following components:   Glucose, Bld 120 (*)    All other components within normal limits    EKG None  Radiology No results found.  Procedures Procedures    Medications Ordered in ED Medications  pantoprazole (PROTONIX) injection 40 mg (40 mg Intravenous Given 10/26/23 2130)    ED Course/ Medical Decision Making/ A&P                                 Medical Decision Making Amount and/or Complexity of Data Reviewed Labs: ordered.  Risk Prescription drug management. Decision regarding hospitalization.   53 yo F with a chief complaints of dark tarry stools.  Kimberly Kelly said Kimberly Kelly has had an upper GI bleed in the past when Kimberly Kelly had necrotizing pancreatitis and thinks this looks the same.  Kimberly Kelly has a history of esophageal varices.  Kimberly Kelly status post banding last endoscopy about a  month ago did not demonstrate any obvious active varices.  Kimberly Kelly had a hemoglobin this morning that was normal.  Will check labs here.  Hemoglobin from 12.9 this morning to 12.4 here this afternoon.  BUN is unremarkable.  With her history of varices and slowly downward trending hemoglobin suspect likely observation will be beneficial.  Kimberly Kelly also may benefit from an evaluation from GI acutely.  Will discuss with medicine.  Given a dose of Protonix.  With recent endoscopy and no varices we will hold off on the octreotide therapy.  The patients results and plan were reviewed and discussed.   Any x-rays performed were independently reviewed by myself.   Differential diagnosis were considered with the presenting HPI.  Medications  pantoprazole (PROTONIX) injection 40 mg (40 mg Intravenous Given 10/26/23 2130)    Vitals:   10/26/23 2030 10/26/23 2100 10/26/23 2130 10/26/23 2147  BP: (!) 134/93 123/84 (!) 128/94 133/83  Pulse: 86 84 75 89  Resp: 18 16 17    Temp:      TempSrc:      SpO2: 100% 97% 99% 97%    Final diagnoses:  Upper GI bleed    Admission/ observation were discussed with the admitting physician, patient and/or family and they are comfortable with the plan.          Final Clinical Impression(s) / ED Diagnoses Final diagnoses:  Upper GI bleed    Rx / DC Orders ED Discharge Orders     None         Melene Plan, DO 10/26/23 2310

## 2023-10-26 NOTE — Progress Notes (Signed)
 Hospitalist Transfer Note:    Nursing staff, Please call [facility] Admits & Consults System-Wide number on Amion [phone]) as soon as patient's arrival, so appropriate admitting provider can evaluate the pt.   Transferring facility: [facility] Requesting provider: Dr.  (EDP at [facility]) Reason for transfer: admission for further evaluation and management of acute upper GI bleed.     53 year old female, who presented to [facility] ED complaining of 2-3 episodes of dark-colored stool over the course of the last day.  Associated any abdominal discomfort, nausea, vomiting, hematemesis, chest pain, shortness of breath, dizziness.  She is not a blood thinners as an outpatient.  In the setting of these episodes and dark-colored stool, she had outpatient labs performed earlier today, which were notable for hemoglobin of 12.9, which is relative to most recent prior hemoglobin value of 13.7 on 07/07/2023.  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  It is noted that her most recent EGD showed interval resolution of esophageal variceQs. Afebrile; Vital signs in the ED were notable for the following: Heart rates in the 70s to 90s; systolic blood pressures in the 120s to 140s.   Labs were notable for hemoglobin 12.4.  BUN 19.  Medications administered prior to transfer included the following: Protonix 40 mg IV x 1 dose.  The patient conveys her preference for admission to [facility] as opposed to [facility].  EDP has contacted on-call [facility] requesting consultation in the morning.   Subsequently, I accepted this patient for transfer for observation admission to a med-tele bed at [facility] (per pt preference, as above) for further work-up and management of the above.      Newton Pigg, DO Hospitalist

## 2023-10-26 NOTE — ED Triage Notes (Signed)
 Blood in stool x 2 day. Lab work performed today with [facility] (liver doctor) was normal but Gastroenterologist referred patient to ED for further workup due to patient's history.   Patients hx includes pancreatitis, necrotizing pancreatitis, GI bleed, Sepsis, portal vein blood clot, esophageal and stomach varices.

## 2023-10-27 DIAGNOSIS — K922 Gastrointestinal hemorrhage, unspecified: Secondary | ICD-10-CM

## 2023-10-27 LAB — BASIC METABOLIC PANEL
Anion gap: 9 (ref 5–15)
BUN: 16 mg/dL (ref 6–20)
CO2: 22 mmol/L (ref 22–32)
Calcium: 9.1 mg/dL (ref 8.9–10.3)
Chloride: 109 mmol/L (ref 98–111)
Creatinine, Ser: 0.81 mg/dL (ref 0.44–1.00)
GFR, Estimated: >60 mL/min (ref >60)
Glucose, Bld: 132 mg/dL — ABNORMAL HIGH (ref 70–99)
Potassium: 4 mmol/L (ref 3.5–5.1)
Sodium: 140 mmol/L (ref 135–145)

## 2023-10-27 LAB — CBC
HCT: 36 % (ref 36.0–46.0)
Hemoglobin: 12.1 g/dL (ref 12.0–15.0)
MCH: 27.9 pg (ref 26.0–34.0)
MCHC: 33.6 g/dL (ref 30.0–36.0)
MCV: 83.1 fL (ref 80.0–100.0)
Platelets: 114 10*3/uL — ABNORMAL LOW (ref 150–400)
RBC: 4.33 MIL/uL (ref 3.87–5.11)
RDW: 14.5 % (ref 11.5–15.5)
WBC: 3.3 10*3/uL — ABNORMAL LOW (ref 4.0–10.5)
nRBC: 0 % (ref 0.0–0.2)

## 2023-10-27 LAB — HEMOGLOBIN AND HEMATOCRIT, BLOOD
HCT: 33.8 % — ABNORMAL LOW (ref 36.0–46.0)
Hemoglobin: 11.4 g/dL — ABNORMAL LOW (ref 12.0–15.0)

## 2023-10-27 LAB — PHOSPHORUS: Phosphorus: 4.1 mg/dL (ref 2.5–4.6)

## 2023-10-27 LAB — MAGNESIUM: Magnesium: 2 mg/dL (ref 1.7–2.4)

## 2023-10-27 MED ORDER — ALPRAZOLAM 0.5 MG PO TABS
0.5000 mg | ORAL_TABLET | ORAL | Status: AC
  Administered 2023-10-27: 0.5 mg via ORAL
  Filled 2023-10-27: qty 1

## 2023-10-27 MED ORDER — LACTATED RINGERS IV SOLN: INTRAVENOUS | Status: DC

## 2023-10-27 MED ORDER — PANTOPRAZOLE SODIUM 40 MG IV SOLR
40.0000 mg | Freq: Two times a day (BID) | INTRAVENOUS | Status: DC
  Administered 2023-10-27 (×2): 40 mg via INTRAVENOUS
  Filled 2023-10-27 (×2): qty 10

## 2023-10-27 MED ORDER — MELATONIN 5 MG PO TABS
5.0000 mg | ORAL_TABLET | Freq: Every evening | ORAL | Status: DC | PRN
  Filled 2023-10-27: qty 1

## 2023-10-27 MED ORDER — ACETAMINOPHEN 325 MG PO TABS
650.0000 mg | ORAL_TABLET | Freq: Four times a day (QID) | ORAL | Status: DC | PRN
Start: 2023-10-27 — End: 2023-10-27

## 2023-10-27 MED ORDER — OMEPRAZOLE MAGNESIUM 20 MG PO TBEC: 20.0000 mg | DELAYED_RELEASE_TABLET | Freq: Two times a day (BID) | ORAL | 0 refills | Status: AC

## 2023-10-27 MED ORDER — PROCHLORPERAZINE EDISYLATE 10 MG/2ML IJ SOLN: 5.0000 mg | Freq: Four times a day (QID) | INTRAMUSCULAR | Status: DC | PRN

## 2023-10-27 NOTE — TOC Transition Note (Signed)
 Transition of Care [facility]) - Discharge Note   Patient Details  Name: Kimberly Kelly MRN: [id number] Date of Birth: 08-26-1970  Transition of Care [facility]) CM/SW Contact:  Tom-Johnson, Hershal Coria, RN Phone Number: 10/27/2023, 1:31 PM   Clinical Narrative:     Patient is scheduled for discharge today.  Outpatient f/u, hospital f/u and discharge instructions on AVS. No TOC needs or recommendations noted. Family to transport at discharge.  No further TOC needs noted.        Final next level of care: Home/Self Care Barriers to Discharge: Barriers Resolved   Patient Goals and CMS Choice Patient states their goals for this hospitalization and ongoing recovery are:: To return home CMS Medicare.gov Compare Post Acute Care list provided to:: Patient Choice offered to / list presented to : NA      Discharge Placement                Patient to be transferred to facility by: Family      Discharge Plan and Services Additional resources added to the After Visit Summary for                  DME Arranged: N/A DME Agency: NA       HH Arranged: NA HH Agency: NA        Social Drivers of Health (SDOH) Interventions SDOH Screenings   Food Insecurity: No Food Insecurity (10/27/2023)  Housing: Low Risk  (10/27/2023)  Transportation Needs: No Transportation Needs (10/27/2023)  Utilities: Not At Risk (10/27/2023)  Financial Resource Strain: Low Risk  (11/06/2022)   Received from [facility], [facility]  Physical Activity: Sufficiently Active (11/06/2022)   Received from [facility], [facility]  Social Connections: Socially Integrated (11/06/2022)   Received from [facility], [facility]  Stress: No Stress Concern Present (11/06/2022)   Received from [facility], [facility]  Tobacco Use: Low Risk  (10/26/2023)     Readmission Risk Interventions     No data to display

## 2023-10-27 NOTE — Discharge Summary (Addendum)
 Physician Discharge Summary   Patient: Kimberly Kelly MRN: [id number] DOB: 29-Sep-1970  Admit date:     10/26/2023  Discharge date: 10/27/23  Discharge Physician: Briant Cedar   PCP: Elizabeth Palau, FNP   Recommendations at discharge:   Follow up with PCP for repeat labs Follow with GI at [facility]  Discharge Diagnoses: Principal Problem:   Acute upper GI bleed    Hospital Course: Kimberly Kelly is a 53 y.o. 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  Has been off PPI prior to taking prednisone.  Endorses dark stools for the past 24 hours.  No use of NSAIDs.  No abdominal pain or lightheadedness.  Due to her personal experience with upper GI bleed, the patient became alarmed and decided to come to the ER for further evaluation. In the ER, vital signs and hemoglobin are fairly stable. Pt admitted for observation, started on IVF and IV protonix, with serial H&H monitoring. Today, Pt denied any further dark tarry stool and reported recent BM was normal, denies any BRBPR, hematemesis, abdominal pain, nausea/vomiting, fever/chills. GI was consulted, but stated no need to see as patient's hemoglobin remained fairly stable and denied any recent melena/hematochezia/bright red blood per rectum, advised outpatient follow up. Patient recently had an EGD about 4 weeks ago and was supposedly told to stop taking PPI as per patient.    Today, patient continues to deny any new complaints, very eager to be discharged.  Husband at bedside. Advised to follow-up with her GI specialist as soon as possible, and return to the ED if melena/hematochezia/bright red blood  per rectum is noted.  Advised patient to restart PPI BID (has been on omeprazole, and wishes to continue).   Assessment and Plan:  Suspected acute upper GI bleed with history of moderate severe esophagitis, esophageal ulcer, acute gastritis seen on EGD 01/31/2021 Recent use of steroid taper, was no longer on PPI as noted above No recent use of NSAIDs  Hemoglobin has remained fairly stable 12.1-->11.4 (possible some hemodilution as patient has been on IV fluid), normal BUN GI recommended outpatient follow-up, did not consult, as mentioned above Continue PPI twice daily Outpatient GI follow-up, advised to return to the ED if patient develops melena/hematemesis/bright red blood per rectum   Anxiety On Xanax as needed   Thrombocytopenia Platelet count 122K Monitor for now    Consultants: GI Procedures performed: None Disposition: Home Diet recommendation:  Discharge Diet Orders (From admission, onward)     Start     Ordered   10/27/23 0000  Diet - low sodium heart healthy        10/27/23 1306           Regular diet DISCHARGE MEDICATION: Allergies as of 10/27/2023   No Known Allergies      Medication List     STOP taking these medications    methylPREDNISolone 4 MG Tbpk tablet Commonly known as: MEDROL DOSEPAK       TAKE these medications    ALPRAZolam 0.5 MG tablet Commonly known as: XANAX Take 0.25 mg by mouth daily as needed for anxiety or sleep.   carvedilol 3.125 MG tablet Commonly known as: COREG Take 3.125 mg by  mouth 2 (two) times daily with a meal.   methocarbamol 500 MG tablet Commonly known as: ROBAXIN Take 500 mg by mouth daily as needed for muscle spasms.   omeprazole 20 MG tablet Commonly known as: PriLOSEC OTC Take 1 tablet (20 mg total) by mouth 2 (two) times daily.        Follow-up Information     Elizabeth Palau, FNP. Schedule an appointment as soon as possible for a visit in 1 week(s).   Specialty: Nurse Practitioner Contact  information: [address] [place] [address] [phone]                Discharge Exam: There were no vitals filed for this visit. General: NAD  Cardiovascular: S1, S2 present Respiratory: CTAB Abdomen: Soft, nontender, nondistended, bowel sounds present Musculoskeletal: No bilateral pedal edema noted Skin: Normal Psychiatry: Normal mood    Condition at discharge: stable  The results of significant diagnostics from this hospitalization (including imaging, microbiology, ancillary and laboratory) are listed below for reference.   Imaging Studies: No results found.  Microbiology: Results for orders placed or performed during the hospital encounter of 06/09/22  Surgical pcr screen     Status: None   Collection Time: 06/09/22  8:31 AM   Specimen: Nasal Mucosa; Nasal Swab  Result Value Ref Range Status   MRSA, PCR NEGATIVE NEGATIVE Final   Staphylococcus aureus NEGATIVE NEGATIVE Final    Comment: (NOTE) The Xpert SA Assay (FDA approved for NASAL specimens in patients 74 years of age and older), is one component of a comprehensive surveillance program. It is not intended to diagnose infection nor to guide or monitor treatment. Performed at [facility], [address], [place], [place] [address]   SARS CORONAVIRUS 2 (TAT 6-24 HRS) Anterior Nasal Swab     Status: None   Collection Time: 06/09/22  8:31 AM   Specimen: Anterior Nasal Swab  Result Value Ref Range Status   SARS Coronavirus 2 NEGATIVE NEGATIVE Final    Comment: (NOTE) SARS-CoV-2 target nucleic acids are NOT DETECTED.  The SARS-CoV-2 RNA is generally detectable in upper and lower respiratory specimens during the acute phase of infection. Negative results do not preclude SARS-CoV-2 infection, do not rule out co-infections with other pathogens, and should not be used as the sole basis for treatment or other patient management decisions. Negative results must be combined with  clinical observations, patient history, and epidemiological information. The expected result is Negative.  Fact Sheet for Patients: HairSlick.no  Fact Sheet for Healthcare Providers: quierodirigir.com  This test is not yet approved or cleared by the [place] FDA and  has been authorized for detection and/or diagnosis of SARS-CoV-2 by FDA under an Emergency Use Authorization (EUA). This EUA will remain  in effect (meaning this test can be used) for the duration of the COVID-19 declaration under Se ction 564(b)(1) of the Act, 21 U.S.C. section 360bbb-3(b)(1), unless the authorization is terminated or revoked sooner.  Performed at [facility], [address] [place], [place] [address]     Labs: CBC: Recent Labs  Lab 10/26/23 2042 10/27/23 0929 10/27/23 1218  WBC 4.7 3.3*  --   NEUTROABS 3.2  --   --   HGB 12.4 12.1 11.4*  HCT 37.2 36.0 33.8*  MCV 83.2 83.1  --   PLT 122* 114*  --    Basic Metabolic Panel: Recent Labs  Lab 10/26/23 2042 10/27/23 0930  NA 140 140  K  4.1 4.0  CL 106 109  CO2 27 22  GLUCOSE 120* 132*  BUN 19 16  CREATININE 0.79 0.81  CALCIUM 9.1 9.1  MG  --  2.0  PHOS  --  4.1   Liver Function Tests: No results for input(s): "AST", "ALT", "ALKPHOS", "BILITOT", "PROT", "ALBUMIN" in the last 168 hours. CBG: No results for input(s): "GLUCAP" in the last 168 hours.  Discharge time spent: less than 30 minutes.  Signed: Briant Cedar, MD [facility] 10/27/2023

## 2023-10-27 NOTE — H&P (Incomplete)
 History and Physical  Kimberly Kelly WGN:[id number] DOB: 1970/08/05 DOA: 10/26/2023  Referring physician: Accepted by Dr. Arlean Hopping, [facility], hospitalist service. PCP: Elizabeth Palau, FNP  Outpatient Specialists: GI. Patient coming from: [facility] through [facility].  Chief Complaint: Dark stools.  HPI: Kimberly Kelly is a 53 y.o. 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  Has been off PPI prior to taking prednisone.  Endorses dark stools for the past 24 hours.  No use of NSAIDs.  No abdominal pain or lightheadedness.  Due to her personal experience with upper GI bleed, the patient became alarmed and decided to come to the ER for further evaluation.  In the ER, vital signs and hemoglobin are stable.  The patient received a dose of IV Protonix.  EDP requested admission for further evaluation.  Admitted by [facility], hospitalist service.  ED Course: Temperature 97.4.  BP 138/82, pulse 83, respiration rate 18, O2 saturation 98% on room air.  Labs today notable for hemoglobin 12.4 from 13.7 on 07/07/2023.  Serum glucose 120.  Review of Systems: Review of systems as noted in the HPI. All other systems reviewed and are negative.   Past Medical History:  Diagnosis Date   Anxiety    Arthritis    Complication of anesthesia    Certain medication that gives her general myalgia   Necrotizing pancreatitis 2022   Past Surgical History:  Procedure Laterality Date   BIOPSY  02/07/2021   Procedure: BIOPSY;  Surgeon: Meridee Score Netty Starring., MD;  Location: [facility] ENDOSCOPY;  Service: Gastroenterology;;   BRONCHIAL BIOPSY  06/11/2022   Procedure: BRONCHIAL BIOPSIES;  Surgeon: Omar Person, MD;  Location: [facility] ENDOSCOPY;  Service: Pulmonary;;   BRONCHIAL BRUSHINGS  06/11/2022   Procedure: BRONCHIAL BRUSHINGS;  Surgeon: Omar Person, MD;  Location: [facility] ENDOSCOPY;  Service: Pulmonary;;   BRONCHIAL NEEDLE ASPIRATION BIOPSY  06/11/2022   Procedure: BRONCHIAL NEEDLE ASPIRATION BIOPSIES;  Surgeon: Omar Person, MD;  Location: [facility] ENDOSCOPY;  Service: Pulmonary;;   CESAREAN SECTION     CHOLECYSTECTOMY N/A 10/10/2016   Procedure: LAPAROSCOPIC CHOLECYSTECTOMY WITH INTRAOPERATIVE CHOLANGIOGRAM;  Surgeon: Harriette Bouillon, MD;  Location: [facility] OR;  Service: General;  Laterality: N/A;   ERCP     ESOPHAGOGASTRODUODENOSCOPY (EGD) WITH PROPOFOL N/A 02/01/2021   Procedure: ESOPHAGOGASTRODUODENOSCOPY (EGD) WITH PROPOFOL;  Surgeon: Vida Rigger, MD;  Location: [facility] ENDOSCOPY;  Service: Endoscopy;  Laterality: N/A;   ESOPHAGOGASTRODUODENOSCOPY (EGD) WITH PROPOFOL N/A 02/07/2021   Procedure: ESOPHAGOGASTRODUODENOSCOPY (EGD) WITH PROPOFOL;  Surgeon: Meridee Score Netty Starring., MD;  Location: [facility] ENDOSCOPY;  Service: Gastroenterology;  Laterality: N/A;   FIDUCIAL MARKER PLACEMENT  06/11/2022   Procedure: FIDUCIAL DYE MARKING;  Surgeon: Omar Person, MD;  Location: [facility] ENDOSCOPY;  Service: Pulmonary;;   INTERCOSTAL NERVE BLOCK Right 06/11/2022   Procedure: INTERCOSTAL NERVE BLOCK;  Surgeon: Corliss Skains, MD;  Location: [facility] OR;  Service: Thoracic;  Laterality: Right;   SHOULDER ARTHROSCOPY     TUBAL LIGATION     UPPER ESOPHAGEAL ENDOSCOPIC ULTRASOUND (EUS) Left 02/07/2021   Procedure: UPPER ESOPHAGEAL ENDOSCOPIC ULTRASOUND (EUS);  Surgeon: Lemar Lofty., MD;  Location: [facility] ENDOSCOPY;  Service: Gastroenterology;  Laterality: Left;    Social History:  reports that she  has never smoked. She has never used smokeless tobacco. She reports current alcohol use. She reports that she does not use drugs.   No Known Allergies  Family History  Problem Relation Age of Onset   Breast  cancer Paternal Grandmother       Prior to Admission medications   Medication Sig Start Date End Date Taking? Authorizing Provider  ALPRAZolam Prudy Feeler) 0.5 MG tablet Take 0.5 mg by mouth at bedtime as needed for anxiety or sleep.    [provider]  escitalopram (LEXAPRO) 10 MG tablet Take 10 mg by mouth daily. 08/13/20   [provider]  methocarbamol (ROBAXIN) 500 MG tablet Take 500 mg by mouth every 6 (six) hours as needed for muscle spasms.    [provider]    Physical Exam: BP 138/82 (BP Location: Left Arm)   Pulse 83   Temp (!) 97.4 F (36.3 C) (Oral)   Resp 18   LMP 02/14/2020   SpO2 98%   General: 53 y.o. year-old female well developed well nourished in no acute distress.  Alert and oriented x3. Cardiovascular: Regular rate and rhythm with no rubs or gallops.  No thyromegaly or JVD noted.  No lower extremity edema. 2/4 pulses in all 4 extremities. Respiratory: Clear to auscultation with no wheezes or rales. Good inspiratory effort. Abdomen: Soft nontender nondistended with normal bowel sounds x4 quadrants. Muskuloskeletal: No cyanosis, clubbing or edema noted bilaterally Neuro: CN II-XII intact, strength, sensation, reflexes Skin: No ulcerative lesions noted or rashes Psychiatry: Judgement and insight appear normal. Mood is appropriate for condition and setting          Labs on Admission:  Basic Metabolic Panel: Recent Labs  Lab 10/26/23 2042  NA 140  K 4.1  CL 106  CO2 27  GLUCOSE 120*  BUN 19  CREATININE 0.79  CALCIUM 9.1   Liver Function Tests: No results for input(s): "AST", "ALT", "ALKPHOS", "BILITOT", "PROT", "ALBUMIN" in the last 168 hours. No results for input(s): "LIPASE", "AMYLASE" in the last 168 hours. No results for input(s): "AMMONIA" in the last 168 hours. CBC: Recent Labs  Lab 10/26/23 2042  WBC 4.7  NEUTROABS 3.2  HGB 12.4  HCT 37.2  MCV 83.2  PLT 122*   Cardiac Enzymes: No results for input(s):  "CKTOTAL", "CKMB", "CKMBINDEX", "TROPONINI" in the last 168 hours.  BNP (last 3 results) No results for input(s): "BNP" in the last 8760 hours.  ProBNP (last 3 results) No results for input(s): "PROBNP" in the last 8760 hours.  CBG: No results for input(s): "GLUCAP" in the last 168 hours.  Radiological Exams on Admission: No results found.  EKG: I independently viewed the EKG done and my findings are as followed: None available at the time of this visit.  Assessment/Plan Present on Admission:  Acute upper GI bleed  Principal Problem:   Acute upper GI bleed  Suspected acute upper GI bleed with history of moderate severe esophagitis, esophageal ulcer, acute gastritis seen on EGD 01/31/2021 Recent use of steroid taper, was no longer on PPI No recent use of NSAIDs  Trend H&H every 6 hours x 2 Continue IV PPI twice daily [facility] consulted  Anxiety On Xanax as needed  Thrombocytopenia Platelet count 122K Monitor for now   Time: 75 minutes.   DVT prophylaxis: SCDs.   Code Status: Full code.  Family Communication: None at bedside.  Disposition Plan: Admitted to telemetry medical unit.  Consults called: [facility], Dr. Marina Goodell, consulted.  Admission status:  Observation status.   Status is: Observation    Darlin Drop MD [facility] Pager [phone]  If 7PM-7AM, please contact night-coverage [email or web address] Password [email or web address]  10/27/2023, 12:01 AM

## 2024-01-03 ENCOUNTER — Inpatient Hospital Stay: Payer: 59 | Attending: Internal Medicine

## 2024-01-03 ENCOUNTER — Ambulatory Visit (HOSPITAL_COMMUNITY)
Admission: RE | Admit: 2024-01-03 | Discharge: 2024-01-03 | Disposition: A | Discharge status: Home / Self Care | Source: Ambulatory Visit | Attending: Internal Medicine | Admitting: Internal Medicine

## 2024-01-03 DIAGNOSIS — C3490 Malignant neoplasm of unspecified part of unspecified bronchus or lung: Secondary | ICD-10-CM | POA: Insufficient documentation

## 2024-01-03 DIAGNOSIS — R161 Splenomegaly, not elsewhere classified: Secondary | ICD-10-CM | POA: Diagnosis not present

## 2024-01-03 DIAGNOSIS — D72819 Decreased white blood cell count, unspecified: Secondary | ICD-10-CM | POA: Insufficient documentation

## 2024-01-03 DIAGNOSIS — Z85118 Personal history of other malignant neoplasm of bronchus and lung: Secondary | ICD-10-CM | POA: Insufficient documentation

## 2024-01-03 LAB — CMP (CANCER CENTER ONLY)
ALT: 22 U/L (ref 0–44)
AST: 15 U/L (ref 15–41)
Albumin: 4.4 g/dL (ref 3.5–5.0)
Alkaline Phosphatase: 95 U/L (ref 38–126)
Anion gap: 5 (ref 5–15)
BUN: 20 mg/dL (ref 6–20)
CO2: 29 mmol/L (ref 22–32)
Calcium: 9.1 mg/dL (ref 8.9–10.3)
Chloride: 106 mmol/L (ref 98–111)
Creatinine: 0.88 mg/dL (ref 0.44–1.00)
GFR, Estimated: >60 mL/min (ref >60)
Glucose, Bld: 134 mg/dL — ABNORMAL HIGH (ref 70–99)
Potassium: 4 mmol/L (ref 3.5–5.1)
Sodium: 140 mmol/L (ref 135–145)
Total Bilirubin: 0.9 mg/dL (ref 0.0–1.2)
Total Protein: 6.9 g/dL (ref 6.5–8.1)

## 2024-01-03 LAB — CBC WITH DIFFERENTIAL (CANCER CENTER ONLY)
Abs Immature Granulocytes: 0.03 10*3/uL (ref 0.00–0.07)
Basophils Absolute: 0 10*3/uL (ref 0.0–0.1)
Basophils Relative: 1 %
Eosinophils Absolute: 0.1 10*3/uL (ref 0.0–0.5)
Eosinophils Relative: 4 %
HCT: 39.4 % (ref 36.0–46.0)
Hemoglobin: 13.1 g/dL (ref 12.0–15.0)
Immature Granulocytes: 1 %
Lymphocytes Relative: 16 %
Lymphs Abs: 0.5 10*3/uL — ABNORMAL LOW (ref 0.7–4.0)
MCH: 26.8 pg (ref 26.0–34.0)
MCHC: 33.2 g/dL (ref 30.0–36.0)
MCV: 80.6 fL (ref 80.0–100.0)
Monocytes Absolute: 0.2 10*3/uL (ref 0.1–1.0)
Monocytes Relative: 5 %
Neutro Abs: 2.2 10*3/uL (ref 1.7–7.7)
Neutrophils Relative %: 73 %
Platelet Count: 116 10*3/uL — ABNORMAL LOW (ref 150–400)
RBC: 4.89 MIL/uL (ref 3.87–5.11)
RDW: 12.9 % (ref 11.5–15.5)
WBC Count: 3 10*3/uL — ABNORMAL LOW (ref 4.0–10.5)
nRBC: 0 % (ref 0.0–0.2)

## 2024-01-03 MED ORDER — IOHEXOL 300 MG/ML  SOLN
75.0000 mL | Freq: Once | INTRAMUSCULAR | Status: AC | PRN
  Administered 2024-01-03: 75 mL via INTRAVENOUS

## 2024-01-11 ENCOUNTER — Inpatient Hospital Stay (HOSPITAL_BASED_OUTPATIENT_CLINIC_OR_DEPARTMENT_OTHER): Payer: 59 | Admitting: Internal Medicine

## 2024-01-11 VITALS — BP 128/77 | HR 72 | Temp 98.0°F | Resp 16 | Wt 207.8 lb

## 2024-01-11 DIAGNOSIS — Z85118 Personal history of other malignant neoplasm of bronchus and lung: Secondary | ICD-10-CM | POA: Diagnosis not present

## 2024-01-11 DIAGNOSIS — C3490 Malignant neoplasm of unspecified part of unspecified bronchus or lung: Secondary | ICD-10-CM | POA: Diagnosis not present

## 2024-01-11 NOTE — Progress Notes (Signed)
 [facility] Telephone:([phone]) [phone]   Fax:([phone]) [phone]  OFFICE PROGRESS NOTE  Kimberly Heritage, FNP [address] [place] [address]  DIAGNOSIS: Stage IA (T1b, N0, M0) non-small cell lung cancer, adenocarcinoma presented with superior segment right lower lobe lung nodule   PRIOR THERAPY: status post right lower lobectomy with lymph node sampling under the care of Dr. Deloise Ferries on June 11, 2022.   CURRENT THERAPY: Observation.   INTERVAL HISTORY: Kimberly Kelly 53 y.o. female returns to the clinic today for 78-month follow-up visit.Discussed the use of AI scribe software for clinical note transcription with the patient, who gave verbal consent to proceed.  History of Present Illness   Kimberly Kelly is a 53 year old female with stage one Non- small cell lung cancer and adenocarcinoma, status post right lower lobectomy, who presents for evaluation and repeat CT scan for restaging of her disease.  She underwent a right lower lobectomy with lymph node dissection in November 2023 and has been on observation since then. No changes in her health or new conditions have been noted since her last visit six months ago.  Radiologists have repeatedly noted cirrhosis in her reports, but a liver biopsy confirmed her liver is normal.   Recent evaluations by a hematologist ruled out any bone marrow issues. She has a clot in the portal vein, contributing to symptoms such as dyspnea and varices. Her white blood count has been low.  She has been informed of an elevated risk for breast cancer based on recent mammograms, despite no family history of breast cancer. She is overweight but active, has dense breast tissue, and has breastfed her children. She is unsure why she is categorized as high risk.       MEDICAL HISTORY: Past Medical History:  Diagnosis Date   Anxiety    Arthritis    Complication of anesthesia    Certain medication that gives her general  myalgia   Necrotizing pancreatitis 2022    ALLERGIES:  has no known allergies.  MEDICATIONS:  Current Outpatient Medications  Medication Sig Dispense Refill   ALPRAZolam  (XANAX ) 0.5 MG tablet Take 0.25 mg by mouth daily as needed for anxiety or sleep.     carvedilol (COREG) 3.125 MG tablet Take 3.125 mg by mouth 2 (two) times daily with a meal.     methocarbamol  (ROBAXIN ) 500 MG tablet Take 500 mg by mouth daily as needed for muscle spasms.     omeprazole  (PRILOSEC  OTC) 20 MG tablet Take 1 tablet (20 mg total) by mouth 2 (two) times daily. 60 tablet 0   No current facility-administered medications for this visit.    SURGICAL HISTORY:  Past Surgical History:  Procedure Laterality Date   BIOPSY  02/07/2021   Procedure: BIOPSY;  Surgeon: Mansouraty, Albino Alu., MD;  Location: [facility] ENDOSCOPY;  Service: Gastroenterology;;   BRONCHIAL BIOPSY  06/11/2022   Procedure: BRONCHIAL BIOPSIES;  Surgeon: Gloriajean Large, MD;  Location: [facility] ENDOSCOPY;  Service: Pulmonary;;   BRONCHIAL BRUSHINGS  06/11/2022   Procedure: BRONCHIAL BRUSHINGS;  Surgeon: Gloriajean Large, MD;  Location: [facility] ENDOSCOPY;  Service: Pulmonary;;   BRONCHIAL NEEDLE ASPIRATION BIOPSY  06/11/2022   Procedure: BRONCHIAL NEEDLE ASPIRATION BIOPSIES;  Surgeon: Gloriajean Large, MD;  Location: [facility] ENDOSCOPY;  Service: Pulmonary;;   CESAREAN SECTION     CHOLECYSTECTOMY N/A 10/10/2016   Procedure: LAPAROSCOPIC CHOLECYSTECTOMY WITH INTRAOPERATIVE CHOLANGIOGRAM;  Surgeon: Sim Dryer, MD;  Location: [facility] OR;  Service:  General;  Laterality: N/A;   ERCP     ESOPHAGOGASTRODUODENOSCOPY (EGD) WITH PROPOFOL  N/A 02/01/2021   Procedure: ESOPHAGOGASTRODUODENOSCOPY (EGD) WITH PROPOFOL ;  Surgeon: Ozell Blunt, MD;  Location: [facility] ENDOSCOPY;  Service: Endoscopy;  Laterality: N/A;   ESOPHAGOGASTRODUODENOSCOPY (EGD) WITH PROPOFOL  N/A 02/07/2021   Procedure: ESOPHAGOGASTRODUODENOSCOPY (EGD) WITH PROPOFOL ;  Surgeon: Brice Campi Albino Alu., MD;   Location: [facility] ENDOSCOPY;  Service: Gastroenterology;  Laterality: N/A;   FIDUCIAL MARKER PLACEMENT  06/11/2022   Procedure: FIDUCIAL DYE MARKING;  Surgeon: Gloriajean Large, MD;  Location: [facility] ENDOSCOPY;  Service: Pulmonary;;   INTERCOSTAL NERVE BLOCK Right 06/11/2022   Procedure: INTERCOSTAL NERVE BLOCK;  Surgeon: Hilarie Lovely, MD;  Location: [facility] OR;  Service: Thoracic;  Laterality: Right;   SHOULDER ARTHROSCOPY     TUBAL LIGATION     UPPER ESOPHAGEAL ENDOSCOPIC ULTRASOUND (EUS) Left 02/07/2021   Procedure: UPPER ESOPHAGEAL ENDOSCOPIC ULTRASOUND (EUS);  Surgeon: Normie Becton., MD;  Location: [facility] ENDOSCOPY;  Service: Gastroenterology;  Laterality: Left;    REVIEW OF SYSTEMS:  Constitutional: negative Eyes: negative Ears, nose, mouth, throat, and face: negative Respiratory: negative Cardiovascular: negative Gastrointestinal: negative Genitourinary:negative Integument/breast: negative Hematologic/lymphatic: negative Musculoskeletal:negative Neurological: negative Behavioral/Psych: negative Endocrine: negative Allergic/Immunologic: negative   PHYSICAL EXAMINATION: General appearance: alert, cooperative, and no distress Head: Normocephalic, without obvious abnormality, atraumatic Neck: no adenopathy, no JVD, supple, symmetrical, trachea midline, and thyroid  not enlarged, symmetric, no tenderness/mass/nodules Lymph nodes: Cervical, supraclavicular, and axillary nodes normal. Resp: clear to auscultation bilaterally Back: symmetric, no curvature. ROM normal. No CVA tenderness. Cardio: regular rate and rhythm, S1, S2 normal, no murmur, click, rub or gallop GI: soft, non-tender; bowel sounds normal; no masses,  no organomegaly Extremities: extremities normal, atraumatic, no cyanosis or edema Neurologic: Alert and oriented X 3, normal strength and tone. Normal symmetric reflexes. Normal coordination and gait  ECOG PERFORMANCE STATUS: 1 - Symptomatic but completely  ambulatory  Blood pressure 128/77, pulse 72, temperature 98 F (36.7 C), temperature source Temporal, resp. rate 16, weight 207 lb 12.8 oz (94.3 kg), last menstrual period 02/14/2020, SpO2 98%.  LABORATORY DATA: Lab Results  Component Value Date   WBC 3.0 (L) 01/03/2024   HGB 13.1 01/03/2024   HCT 39.4 01/03/2024   MCV 80.6 01/03/2024   PLT 116 (L) 01/03/2024      Chemistry      Component Value Date/Time   NA 140 01/03/2024 0751   K 4.0 01/03/2024 0751   CL 106 01/03/2024 0751   CO2 29 01/03/2024 0751   BUN 20 01/03/2024 0751   CREATININE 0.88 01/03/2024 0751      Component Value Date/Time   CALCIUM 9.1 01/03/2024 0751   ALKPHOS 95 01/03/2024 0751   AST 15 01/03/2024 0751   ALT 22 01/03/2024 0751   BILITOT 0.9 01/03/2024 0751       RADIOGRAPHIC STUDIES: CT Chest W Contrast Result Date: 01/05/2024 EXAM: CT CHEST WITH CONTRAST 01/03/2024 09:19:00 AM TECHNIQUE: CT of the chest was performed with the administration of intravenous contrast. Multiplanar reformatted images are provided for review. Automated exposure control, iterative reconstruction, and/or weight based adjustment of the mA/kV was utilized to reduce the radiation dose to as low as reasonably achievable. COMPARISON: 07/07/2023 CLINICAL HISTORY: Non-small cell lung cancer (NSCLC), staging. Scan due to NSCLC. FINDINGS: MEDIASTINUM: Heart and pericardium are unremarkable. The central airways are clear. LYMPH NODES: No mediastinal, hilar or axillary lymphadenopathy. LUNGS AND PLEURA: Status post right lower lobectomy. Lobulated 13 x 9 mm left lobe of nodule (image 106),  unchanged. No new or suspicious pulmonary nodules. SOFT TISSUES/BONES: No acute abnormality of the bones or soft tissues. UPPER ABDOMEN: Cirrhosis. Splenomegaly. Upper abdominal varices, incompletely visualized. Cholecystectomy. IMPRESSION: 1. Status post right lower lobectomy. 2. Stable 13 mm left lower lobe nodule, benign. 3. No evidence of recurrent or  metastatic disease. Electronically signed by: Zadie Herter MD 01/05/2024 08:40 PM EDT RP Workstation: [id number]    ASSESSMENT AND PLAN: This is a very pleasant 53 years old white female with stage Ia (T1b, N0, M0) non-small cell lung cancer, adenocarcinoma presented with superior segment right lower lobe lung nodule status post right lower lobectomy with lymph node sampling under the care of Dr. Deloise Ferries on June 11, 2022.  The patient is feeling fine with no concerning complaints. She had repeat CT scan of the chest performed recently.  I personally and independently reviewed the scan and discussed the result with the patient today.  Her scan showed no concerning findings for disease recurrence or metastasis. Assessment and Plan    Stage I Non-small cell lung cancer Status post right lower lobectomy with lymph node dissection in November 2023. Currently asymptomatic with no evidence of disease progression on recent chest CT scan. 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 Hematologist is not concerned about the thrombosis, which may persist indefinitely without significant issues.  Leukopenia Leukopenia likely related to portal vein thrombosis. Hematologist is aware and not concerned at this time.  Splenomegaly Mild splenomegaly with no bone marrow issues identified. No current concerns from hematologist or oncologist.  Elevated risk of breast cancer Very elevated risk for breast cancer, possibly due to dense breast tissue. No family history or other significant risk factors identified. Regular mammograms are recommended. Referral to a high-risk breast cancer clinic is available if needed, but not deemed necessary at this time. - Continue regular mammograms - Consider referral  to high-risk breast cancer clinic if future concerns arise    The patient voices understanding of current disease status and treatment options and is in agreement with the current care plan.  All questions were answered. The patient knows to call the clinic with any problems, questions or concerns. We can certainly see the patient much sooner if necessary. The total time spent in the appointment was 30 minutes including review of chart and various tests results, discussions about plan of care and coordination of care plan .   Disclaimer: This note was dictated with voice recognition software. Similar sounding words can inadvertently be transcribed and may not be corrected upon review.

## 2024-07-03 ENCOUNTER — Inpatient Hospital Stay: Attending: Internal Medicine

## 2024-07-03 ENCOUNTER — Ambulatory Visit (HOSPITAL_COMMUNITY)
Admission: RE | Admit: 2024-07-03 | Discharge: 2024-07-03 | Disposition: A | Source: Ambulatory Visit | Attending: Internal Medicine | Admitting: Internal Medicine

## 2024-07-03 DIAGNOSIS — Z85118 Personal history of other malignant neoplasm of bronchus and lung: Secondary | ICD-10-CM | POA: Insufficient documentation

## 2024-07-03 DIAGNOSIS — C3490 Malignant neoplasm of unspecified part of unspecified bronchus or lung: Secondary | ICD-10-CM | POA: Insufficient documentation

## 2024-07-03 DIAGNOSIS — Z902 Acquired absence of lung [part of]: Secondary | ICD-10-CM | POA: Insufficient documentation

## 2024-07-03 DIAGNOSIS — Z79899 Other long term (current) drug therapy: Secondary | ICD-10-CM | POA: Diagnosis not present

## 2024-07-03 LAB — CMP (CANCER CENTER ONLY)
ALT: 77 U/L — ABNORMAL HIGH (ref 0–44)
AST: 37 U/L (ref 15–41)
Albumin: 4.6 g/dL (ref 3.5–5.0)
Alkaline Phosphatase: 155 U/L — ABNORMAL HIGH (ref 38–126)
Anion gap: 11 (ref 5–15)
BUN: 19 mg/dL (ref 6–20)
CO2: 25 mmol/L (ref 22–32)
Calcium: 9.6 mg/dL (ref 8.9–10.3)
Chloride: 104 mmol/L (ref 98–111)
Creatinine: 0.9 mg/dL (ref 0.44–1.00)
GFR, Estimated: >60 mL/min (ref >60)
Glucose, Bld: 127 mg/dL — ABNORMAL HIGH (ref 70–99)
Potassium: 4.2 mmol/L (ref 3.5–5.1)
Sodium: 140 mmol/L (ref 135–145)
Total Bilirubin: 0.8 mg/dL (ref 0.0–1.2)
Total Protein: 7.1 g/dL (ref 6.5–8.1)

## 2024-07-03 LAB — CBC WITH DIFFERENTIAL (CANCER CENTER ONLY)
Abs Immature Granulocytes: 0.02 K/uL (ref 0.00–0.07)
Basophils Absolute: 0 K/uL (ref 0.0–0.1)
Basophils Relative: 1 %
Eosinophils Absolute: 0.1 K/uL (ref 0.0–0.5)
Eosinophils Relative: 3 %
HCT: 39.4 % (ref 36.0–46.0)
Hemoglobin: 14.2 g/dL (ref 12.0–15.0)
Immature Granulocytes: 1 %
Lymphocytes Relative: 21 %
Lymphs Abs: 0.9 K/uL (ref 0.7–4.0)
MCH: 28.7 pg (ref 26.0–34.0)
MCHC: 36 g/dL (ref 30.0–36.0)
MCV: 79.6 fL — ABNORMAL LOW (ref 80.0–100.0)
Monocytes Absolute: 0.2 K/uL (ref 0.1–1.0)
Monocytes Relative: 5 %
Neutro Abs: 2.9 K/uL (ref 1.7–7.7)
Neutrophils Relative %: 69 %
Platelet Count: 160 K/uL (ref 150–400)
RBC: 4.95 MIL/uL (ref 3.87–5.11)
RDW: 15.2 % (ref 11.5–15.5)
WBC Count: 4.2 K/uL (ref 4.0–10.5)
nRBC: 0 % (ref 0.0–0.2)

## 2024-07-03 MED ORDER — SODIUM CHLORIDE (PF) 0.9 % IJ SOLN
INTRAMUSCULAR | Status: AC
  Filled 2024-07-03: qty 50

## 2024-07-03 MED ORDER — IOHEXOL 300 MG/ML  SOLN
75.0000 mL | Freq: Once | INTRAMUSCULAR | Status: AC | PRN
  Administered 2024-07-03: 75 mL via INTRAVENOUS

## 2024-07-10 ENCOUNTER — Telehealth: Payer: Self-pay | Admitting: Internal Medicine

## 2024-07-10 ENCOUNTER — Inpatient Hospital Stay: Admitting: Internal Medicine

## 2024-07-10 VITALS — BP 118/71 | HR 82 | Temp 98.2°F | Resp 17 | Ht 67.5 in | Wt 208.0 lb

## 2024-07-10 DIAGNOSIS — C3490 Malignant neoplasm of unspecified part of unspecified bronchus or lung: Secondary | ICD-10-CM | POA: Diagnosis not present

## 2024-07-10 DIAGNOSIS — Z85118 Personal history of other malignant neoplasm of bronchus and lung: Secondary | ICD-10-CM | POA: Diagnosis not present

## 2024-07-10 NOTE — Telephone Encounter (Signed)
 Left the patient a voicemail with scheduled appt details

## 2024-07-10 NOTE — Progress Notes (Signed)
 [facility] [phone]   [phone]  OFFICE PROGRESS NOTE  Samie Frederick, PA-C [address] [place] [address]  DIAGNOSIS: Stage IA (T1b, N0, M0) non-small cell lung cancer, adenocarcinoma presented with superior segment right lower lobe lung nodule   PRIOR THERAPY: status post right lower lobectomy with lymph node sampling under the care of Dr. Shyrl on June 11, 2022.   CURRENT THERAPY: Observation.   INTERVAL HISTORY: Kimberly Kelly 53 y.o. female returns to the clinic today for 62-month follow-up visit.Discussed the use of AI scribe software for clinical note transcription with the patient, who gave verbal consent to proceed.  History of Present Illness Kimberly Kelly is a 53 year old female with stage one non-small cell lung cancer who presents for evaluation with repeat CT scan of the chest for restaging of her disease.  She underwent a right lower lobectomy with lymph node sampling in November 2023 and has been on observation since that time. She feels great and has no current complaints related to her lung cancer. Her recent CT scan of the chest was performed last week, and she is here for evaluation of the results. No new or concerning symptoms related to her lung cancer.  She mentions difficulty with venous access during her last scan, requiring the IV team to assist after multiple attempts. She prefers future scans to be performed without contrast to avoid this issue.  She reports an ongoing issue with her right ankle that has not healed for close to a year. The onset of pain was sudden while walking, which is her preferred form of exercise. Initial evaluation suggested a torn ligament, and she was advised to wear a brace for a month, but the pain persists. She denies having diabetes. She is scheduled for an MRI and plans to start physical therapy.  She is planning a trip to [place]  to see the [employer] and engage in  activities such as hiking and dog sledding.    MEDICAL HISTORY: Past Medical History:  Diagnosis Date   Anxiety    Arthritis    Complication of anesthesia    Certain medication that gives her general myalgia   Necrotizing pancreatitis 2022    ALLERGIES:  has no known allergies.  MEDICATIONS:  Current Outpatient Medications  Medication Sig Dispense Refill   ALPRAZolam  (XANAX ) 0.5 MG tablet Take 0.25 mg by mouth daily as needed for anxiety or sleep.     carvedilol (COREG) 3.125 MG tablet Take 3.125 mg by mouth 2 (two) times daily with a meal.     methocarbamol  (ROBAXIN ) 500 MG tablet Take 500 mg by mouth daily as needed for muscle spasms.     omeprazole  (PRILOSEC  OTC) 20 MG tablet Take 1 tablet (20 mg total) by mouth 2 (two) times daily. 60 tablet 0   No current facility-administered medications for this visit.    SURGICAL HISTORY:  Past Surgical History:  Procedure Laterality Date   BIOPSY  02/07/2021   Procedure: BIOPSY;  Surgeon: Mansouraty, Aloha Raddle., MD;  Location: [facility] ENDOSCOPY;  Service: Gastroenterology;;   BRONCHIAL BIOPSY  06/11/2022   Procedure: BRONCHIAL BIOPSIES;  Surgeon: Gladis Leonor HERO, MD;  Location: [facility] ENDOSCOPY;  Service: Pulmonary;;   BRONCHIAL BRUSHINGS  06/11/2022   Procedure: BRONCHIAL BRUSHINGS;  Surgeon: Gladis Leonor HERO, MD;  Location: [facility] ENDOSCOPY;  Service: Pulmonary;;   BRONCHIAL NEEDLE ASPIRATION BIOPSY  06/11/2022   Procedure: BRONCHIAL NEEDLE ASPIRATION BIOPSIES;  Surgeon: Gladis Leonor  M, MD;  Location: [facility] ENDOSCOPY;  Service: Pulmonary;;   CESAREAN SECTION     CHOLECYSTECTOMY N/A 10/10/2016   Procedure: LAPAROSCOPIC CHOLECYSTECTOMY WITH INTRAOPERATIVE CHOLANGIOGRAM;  Surgeon: Debby Shipper, MD;  Location: [facility] OR;  Service: General;  Laterality: N/A;   ERCP     ESOPHAGOGASTRODUODENOSCOPY (EGD) WITH PROPOFOL  N/A 02/01/2021   Procedure: ESOPHAGOGASTRODUODENOSCOPY (EGD) WITH PROPOFOL ;  Surgeon: Rosalie Kitchens, MD;  Location: [facility] ENDOSCOPY;   Service: Endoscopy;  Laterality: N/A;   ESOPHAGOGASTRODUODENOSCOPY (EGD) WITH PROPOFOL  N/A 02/07/2021   Procedure: ESOPHAGOGASTRODUODENOSCOPY (EGD) WITH PROPOFOL ;  Surgeon: Wilhelmenia Aloha Raddle., MD;  Location: [facility] ENDOSCOPY;  Service: Gastroenterology;  Laterality: N/A;   FIDUCIAL MARKER PLACEMENT  06/11/2022   Procedure: FIDUCIAL DYE MARKING;  Surgeon: Gladis Leonor HERO, MD;  Location: [facility] ENDOSCOPY;  Service: Pulmonary;;   INTERCOSTAL NERVE BLOCK Right 06/11/2022   Procedure: INTERCOSTAL NERVE BLOCK;  Surgeon: Shyrl Linnie KIDD, MD;  Location: [facility] OR;  Service: Thoracic;  Laterality: Right;   SHOULDER ARTHROSCOPY     TUBAL LIGATION     UPPER ESOPHAGEAL ENDOSCOPIC ULTRASOUND (EUS) Left 02/07/2021   Procedure: UPPER ESOPHAGEAL ENDOSCOPIC ULTRASOUND (EUS);  Surgeon: Wilhelmenia Aloha Raddle., MD;  Location: [facility] ENDOSCOPY;  Service: Gastroenterology;  Laterality: Left;    REVIEW OF SYSTEMS:  A comprehensive review of systems was negative.   PHYSICAL EXAMINATION: General appearance: alert, cooperative, and no distress Head: Normocephalic, without obvious abnormality, atraumatic Neck: no adenopathy, no JVD, supple, symmetrical, trachea midline, and thyroid  not enlarged, symmetric, no tenderness/mass/nodules Lymph nodes: Cervical, supraclavicular, and axillary nodes normal. Resp: clear to auscultation bilaterally Back: symmetric, no curvature. ROM normal. No CVA tenderness. Cardio: regular rate and rhythm, S1, S2 normal, no murmur, click, rub or gallop GI: soft, non-tender; bowel sounds normal; no masses,  no organomegaly Extremities: extremities normal, atraumatic, no cyanosis or edema  ECOG PERFORMANCE STATUS: 1 - Symptomatic but completely ambulatory  Blood pressure 118/71, pulse 82, temperature 98.2 F (36.8 C), temperature source Temporal, resp. rate 17, height 5' 7.5 (1.715 m), weight 208 lb (94.3 kg), last menstrual period 02/14/2020, SpO2 99%.  LABORATORY DATA: Lab Results   Component Value Date   WBC 4.2 07/03/2024   HGB 14.2 07/03/2024   HCT 39.4 07/03/2024   MCV 79.6 (L) 07/03/2024   PLT 160 07/03/2024      Chemistry      Component Value Date/Time   NA 140 07/03/2024 1223   K 4.2 07/03/2024 1223   CL 104 07/03/2024 1223   CO2 25 07/03/2024 1223   BUN 19 07/03/2024 1223   CREATININE 0.90 07/03/2024 1223      Component Value Date/Time   CALCIUM 9.6 07/03/2024 1223   ALKPHOS 155 (H) 07/03/2024 1223   AST 37 07/03/2024 1223   ALT 77 (H) 07/03/2024 1223   BILITOT 0.8 07/03/2024 1223       RADIOGRAPHIC STUDIES: CT Chest W Contrast Result Date: 07/07/2024 CLINICAL DATA:  Non-small cell lung cancer.  * Tracking Code: BO * EXAM: CT CHEST WITH CONTRAST TECHNIQUE: Multidetector CT imaging of the chest was performed during intravenous contrast administration. RADIATION DOSE REDUCTION: This exam was performed according to the departmental dose-optimization program which includes automated exposure control, adjustment of the mA and/or kV according to patient size and/or use of iterative reconstruction technique. CONTRAST:  75mL OMNIPAQUE  IOHEXOL  300 MG/ML  SOLN COMPARISON:  01/03/2024 and 03/31/2021. FINDINGS: Cardiovascular: Heart is at the upper limits of normal in size to mildly enlarged. No pericardial effusion. Enlarged pulmonic trunk. Mediastinum/Nodes: Thoracic inlet lymph  nodes are not enlarged by CT size criteria. No pathologically enlarged mediastinal, hilar or axillary lymph nodes. Esophagus is grossly unremarkable. Lungs/Pleura: Right lower lobectomy with scarring in the lower right hemithorax. Chronically stable anterolateral left lower lobe nodule measures 10 x 13 mm (5/113), . No new pulmonary nodules. No pleural fluid. Airway is otherwise unremarkable. Upper Abdomen: Vascular coils are seen in the right hepatic lobe. Portal vein and splenic vein stents. Scarring in the right kidney. Vascular coils in the splenic hilum. Cholecystectomy. Visualized  portions of the liver, adrenal glands, kidneys, spleen, pancreas, stomach and bowel are otherwise grossly unremarkable. No upper abdominal adenopathy. Musculoskeletal: Degenerative changes in the spine. No worrisome lytic or sclerotic lesions. IMPRESSION: 1. Right lower lobectomy. No evidence of recurrent or metastatic disease. 2. Enlarged pulmonic trunk, indicative of pulmonary arterial hypertension. Electronically Signed   By: Newell Eke M.D.   On: 07/07/2024 13:52    ASSESSMENT AND PLAN: This is a very pleasant 53 years old white female with stage Ia (T1b, N0, M0) non-small cell lung cancer, adenocarcinoma presented with superior segment right lower lobe lung nodule status post right lower lobectomy with lymph node sampling under the care of Dr. Shyrl on June 11, 2022.  The patient is feeling fine today with no concerning complaints except for the pain on the right ankle after exercise trauma. She had repeat CT scan of the chest performed recently.  I personally independently reviewed the scan and discussed the result with the patient today.  Her scan showed no concerning findings for disease recurrence or metastasis. Assessment and Plan Assessment & Plan Non-small cell lung cancer, status post right lower lobectomy, under surveillance Stage I non-small cell lung cancer, status post right lower lobectomy with lymph node sampling in November 2023. Currently under surveillance with no evidence of recurrence on recent CT scan. Labs are normal. No physical complaints related to lung cancer. Discussion about the use of contrast in CT scans for better lymph node assessment, but decided to proceed without contrast due to difficulty in vein access and current stability of the disease. - Continue annual surveillance with CT scan of the chest without contrast - Scheduled follow-up in one year She was advised to call immediately if she has any other concerning symptoms in the interval. The patient  voices understanding of current disease status and treatment options and is in agreement with the current care plan.  All questions were answered. The patient knows to call the clinic with any problems, questions or concerns. We can certainly see the patient much sooner if necessary. The total time spent in the appointment was 20 minutes including review of chart and various tests results, discussions about plan of care and coordination of care plan .   Disclaimer: This note was dictated with voice recognition software. Similar sounding words can inadvertently be transcribed and may not be corrected upon review.

## 2025-07-02 ENCOUNTER — Inpatient Hospital Stay

## 2025-07-09 ENCOUNTER — Inpatient Hospital Stay: Admitting: Internal Medicine
